# Patient Record
Sex: Female | Born: 1955 | Race: Black or African American | Hispanic: No | Marital: Single | Smoking: Never smoker
Health system: Southern US, Community
[De-identification: ages and names within clinical notes are randomized; demographics above are authoritative.]

## PROBLEM LIST (undated history)

## (undated) DIAGNOSIS — Z862 Personal history of diseases of the blood and blood-forming organs and certain disorders involving the immune mechanism: Secondary | ICD-10-CM

## (undated) DIAGNOSIS — K635 Polyp of colon: Secondary | ICD-10-CM

## (undated) DIAGNOSIS — N8501 Benign endometrial hyperplasia: Secondary | ICD-10-CM

## (undated) DIAGNOSIS — Z973 Presence of spectacles and contact lenses: Secondary | ICD-10-CM

## (undated) DIAGNOSIS — M5430 Sciatica, unspecified side: Secondary | ICD-10-CM

## (undated) DIAGNOSIS — N3946 Mixed incontinence: Secondary | ICD-10-CM

## (undated) DIAGNOSIS — I00 Rheumatic fever without heart involvement: Secondary | ICD-10-CM

## (undated) DIAGNOSIS — K219 Gastro-esophageal reflux disease without esophagitis: Secondary | ICD-10-CM

## (undated) DIAGNOSIS — I10 Essential (primary) hypertension: Secondary | ICD-10-CM

## (undated) DIAGNOSIS — L900 Lichen sclerosus et atrophicus: Secondary | ICD-10-CM

## (undated) DIAGNOSIS — R112 Nausea with vomiting, unspecified: Secondary | ICD-10-CM

## (undated) DIAGNOSIS — D219 Benign neoplasm of connective and other soft tissue, unspecified: Secondary | ICD-10-CM

## (undated) DIAGNOSIS — D259 Leiomyoma of uterus, unspecified: Secondary | ICD-10-CM

## (undated) DIAGNOSIS — IMO0002 Reserved for concepts with insufficient information to code with codable children: Secondary | ICD-10-CM

## (undated) DIAGNOSIS — Z8679 Personal history of other diseases of the circulatory system: Secondary | ICD-10-CM

## (undated) DIAGNOSIS — D649 Anemia, unspecified: Secondary | ICD-10-CM

## (undated) DIAGNOSIS — Z8709 Personal history of other diseases of the respiratory system: Secondary | ICD-10-CM

## (undated) DIAGNOSIS — J45909 Unspecified asthma, uncomplicated: Secondary | ICD-10-CM

## (undated) DIAGNOSIS — F32A Depression, unspecified: Secondary | ICD-10-CM

## (undated) DIAGNOSIS — F329 Major depressive disorder, single episode, unspecified: Secondary | ICD-10-CM

## (undated) DIAGNOSIS — M1990 Unspecified osteoarthritis, unspecified site: Secondary | ICD-10-CM

## (undated) DIAGNOSIS — G4733 Obstructive sleep apnea (adult) (pediatric): Secondary | ICD-10-CM

## (undated) DIAGNOSIS — R600 Localized edema: Secondary | ICD-10-CM

## (undated) DIAGNOSIS — N3281 Overactive bladder: Secondary | ICD-10-CM

## (undated) DIAGNOSIS — I5189 Other ill-defined heart diseases: Secondary | ICD-10-CM

## (undated) DIAGNOSIS — J382 Nodules of vocal cords: Secondary | ICD-10-CM

## (undated) DIAGNOSIS — N840 Polyp of corpus uteri: Secondary | ICD-10-CM

## (undated) DIAGNOSIS — Z9889 Other specified postprocedural states: Secondary | ICD-10-CM

## (undated) DIAGNOSIS — F411 Generalized anxiety disorder: Secondary | ICD-10-CM

## (undated) DIAGNOSIS — R7303 Prediabetes: Secondary | ICD-10-CM

## (undated) DIAGNOSIS — I4510 Unspecified right bundle-branch block: Secondary | ICD-10-CM

## (undated) HISTORY — PX: HYSTEROSCOPY: SHX211

## (undated) HISTORY — DX: Major depressive disorder, single episode, unspecified: F32.9

## (undated) HISTORY — PX: DILATION AND CURETTAGE OF UTERUS: SHX78

## (undated) HISTORY — DX: Benign neoplasm of connective and other soft tissue, unspecified: D21.9

## (undated) HISTORY — DX: Essential (primary) hypertension: I10

## (undated) HISTORY — DX: Reserved for concepts with insufficient information to code with codable children: IMO0002

## (undated) HISTORY — PX: TONSILLECTOMY: SUR1361

## (undated) HISTORY — DX: Benign endometrial hyperplasia: N85.01

## (undated) HISTORY — DX: Polyp of colon: K63.5

## (undated) HISTORY — DX: Unspecified osteoarthritis, unspecified site: M19.90

## (undated) HISTORY — PX: COLONOSCOPY: SHX174

## (undated) HISTORY — DX: Depression, unspecified: F32.A

---

## 1997-11-17 HISTORY — PX: MYOMECTOMY ABDOMINAL APPROACH: SUR870

## 1997-11-17 HISTORY — PX: MYOMECTOMY: SHX85

## 2003-11-25 ENCOUNTER — Emergency Department (HOSPITAL_COMMUNITY): Admission: EM | Admit: 2003-11-25 | Discharge: 2003-11-25 | Payer: Self-pay | Admitting: Emergency Medicine

## 2004-05-21 ENCOUNTER — Encounter: Admission: RE | Admit: 2004-05-21 | Discharge: 2004-08-19 | Payer: Self-pay | Admitting: Internal Medicine

## 2004-06-20 ENCOUNTER — Ambulatory Visit (HOSPITAL_COMMUNITY): Admission: RE | Admit: 2004-06-20 | Discharge: 2004-06-20 | Payer: Self-pay | Admitting: Obstetrics

## 2004-11-05 ENCOUNTER — Encounter: Admission: RE | Admit: 2004-11-05 | Discharge: 2004-11-05 | Payer: Self-pay | Admitting: Internal Medicine

## 2005-11-12 ENCOUNTER — Emergency Department (HOSPITAL_COMMUNITY): Admission: EM | Admit: 2005-11-12 | Discharge: 2005-11-12 | Payer: Self-pay | Admitting: Family Medicine

## 2006-05-22 ENCOUNTER — Ambulatory Visit (HOSPITAL_COMMUNITY): Admission: RE | Admit: 2006-05-22 | Discharge: 2006-05-22 | Payer: Self-pay | Admitting: Obstetrics

## 2006-12-10 ENCOUNTER — Emergency Department (HOSPITAL_COMMUNITY): Admission: EM | Admit: 2006-12-10 | Discharge: 2006-12-10 | Payer: Self-pay | Admitting: Family Medicine

## 2006-12-14 ENCOUNTER — Emergency Department (HOSPITAL_COMMUNITY): Admission: EM | Admit: 2006-12-14 | Discharge: 2006-12-14 | Payer: Self-pay | Admitting: Family Medicine

## 2008-08-22 ENCOUNTER — Ambulatory Visit (HOSPITAL_COMMUNITY): Admission: RE | Admit: 2008-08-22 | Discharge: 2008-08-22 | Payer: Self-pay | Admitting: Internal Medicine

## 2008-10-05 ENCOUNTER — Ambulatory Visit: Payer: Self-pay | Admitting: Obstetrics and Gynecology

## 2008-10-05 ENCOUNTER — Encounter: Payer: Self-pay | Admitting: Obstetrics and Gynecology

## 2008-10-05 ENCOUNTER — Other Ambulatory Visit: Admission: RE | Admit: 2008-10-05 | Discharge: 2008-10-05 | Payer: Self-pay | Admitting: Obstetrics and Gynecology

## 2008-10-17 ENCOUNTER — Ambulatory Visit: Payer: Self-pay | Admitting: Obstetrics and Gynecology

## 2008-11-05 ENCOUNTER — Emergency Department (HOSPITAL_COMMUNITY): Admission: EM | Admit: 2008-11-05 | Discharge: 2008-11-05 | Payer: Self-pay | Admitting: Family Medicine

## 2008-11-17 DIAGNOSIS — Z8742 Personal history of other diseases of the female genital tract: Secondary | ICD-10-CM

## 2008-11-17 HISTORY — DX: Personal history of other diseases of the female genital tract: Z87.42

## 2008-11-24 ENCOUNTER — Ambulatory Visit: Payer: Self-pay | Admitting: Obstetrics and Gynecology

## 2008-11-24 ENCOUNTER — Encounter: Payer: Self-pay | Admitting: Obstetrics and Gynecology

## 2008-11-24 ENCOUNTER — Ambulatory Visit (HOSPITAL_COMMUNITY): Admission: RE | Admit: 2008-11-24 | Discharge: 2008-11-24 | Payer: Self-pay | Admitting: Obstetrics and Gynecology

## 2008-12-04 HISTORY — PX: HYSTEROSCOPY WITH D & C: SHX1775

## 2008-12-25 ENCOUNTER — Ambulatory Visit: Payer: Self-pay | Admitting: Obstetrics and Gynecology

## 2009-02-26 ENCOUNTER — Ambulatory Visit: Payer: Self-pay | Admitting: Obstetrics and Gynecology

## 2009-05-03 ENCOUNTER — Ambulatory Visit: Payer: Self-pay | Admitting: Obstetrics and Gynecology

## 2009-10-23 ENCOUNTER — Ambulatory Visit (HOSPITAL_COMMUNITY): Admission: RE | Admit: 2009-10-23 | Discharge: 2009-10-23 | Payer: Self-pay | Admitting: Internal Medicine

## 2009-12-28 ENCOUNTER — Other Ambulatory Visit: Admission: RE | Admit: 2009-12-28 | Discharge: 2009-12-28 | Payer: Self-pay | Admitting: Obstetrics and Gynecology

## 2009-12-28 ENCOUNTER — Ambulatory Visit: Payer: Self-pay | Admitting: Obstetrics and Gynecology

## 2010-01-01 ENCOUNTER — Ambulatory Visit: Payer: Self-pay | Admitting: Obstetrics and Gynecology

## 2010-04-05 ENCOUNTER — Ambulatory Visit (HOSPITAL_BASED_OUTPATIENT_CLINIC_OR_DEPARTMENT_OTHER): Admission: RE | Admit: 2010-04-05 | Discharge: 2010-04-05 | Payer: Self-pay | Admitting: Internal Medicine

## 2010-04-06 ENCOUNTER — Ambulatory Visit: Payer: Self-pay | Admitting: Internal Medicine

## 2010-12-08 ENCOUNTER — Encounter: Payer: Self-pay | Admitting: Internal Medicine

## 2011-01-31 ENCOUNTER — Other Ambulatory Visit (HOSPITAL_COMMUNITY): Payer: Self-pay | Admitting: Internal Medicine

## 2011-02-07 ENCOUNTER — Other Ambulatory Visit (HOSPITAL_COMMUNITY): Payer: Self-pay | Admitting: Internal Medicine

## 2011-03-03 LAB — BASIC METABOLIC PANEL
BUN: 10 mg/dL (ref 6–23)
BUN: 11 mg/dL (ref 6–23)
CO2: 25 mEq/L (ref 19–32)
CO2: 28 mEq/L (ref 19–32)
Calcium: 8.3 mg/dL — ABNORMAL LOW (ref 8.4–10.5)
Calcium: 9 mg/dL (ref 8.4–10.5)
Chloride: 101 mEq/L (ref 96–112)
Chloride: 103 mEq/L (ref 96–112)
Creatinine, Ser: 0.63 mg/dL (ref 0.4–1.2)
Creatinine, Ser: 0.78 mg/dL (ref 0.4–1.2)
GFR calc Af Amer: >60 mL/min (ref >60)
GFR calc Af Amer: >60 mL/min (ref >60)
GFR calc non Af Amer: >60 mL/min (ref >60)
GFR calc non Af Amer: >60 mL/min (ref >60)
Glucose, Bld: 108 mg/dL — ABNORMAL HIGH (ref 70–99)
Glucose, Bld: 118 mg/dL — ABNORMAL HIGH (ref 70–99)
Potassium: 3.5 mEq/L (ref 3.5–5.1)
Potassium: 4.2 mEq/L (ref 3.5–5.1)
Sodium: 133 mEq/L — ABNORMAL LOW (ref 135–145)
Sodium: 137 mEq/L (ref 135–145)

## 2011-03-03 LAB — URINALYSIS, ROUTINE W REFLEX MICROSCOPIC
Glucose, UA: NEGATIVE mg/dL
Hgb urine dipstick: NEGATIVE
Ketones, ur: NEGATIVE mg/dL
Nitrite: NEGATIVE
Protein, ur: NEGATIVE mg/dL
Specific Gravity, Urine: 1.025 (ref 1.005–1.030)
Urobilinogen, UA: 0.2 mg/dL (ref 0.0–1.0)
pH: 5.5 (ref 5.0–8.0)

## 2011-03-03 LAB — CBC
HCT: 42.2 % (ref 36.0–46.0)
Hemoglobin: 13.8 g/dL (ref 12.0–15.0)
MCHC: 32.8 g/dL (ref 30.0–36.0)
MCV: 81 fL (ref 78.0–100.0)
Platelets: 345 10*3/uL (ref 150–400)
RBC: 5.2 MIL/uL — ABNORMAL HIGH (ref 3.87–5.11)
RDW: 16.1 % — ABNORMAL HIGH (ref 11.5–15.5)
WBC: 9.1 10*3/uL (ref 4.0–10.5)

## 2011-03-03 LAB — HCG, SERUM, QUALITATIVE: Preg, Serum: NEGATIVE

## 2011-03-27 ENCOUNTER — Other Ambulatory Visit (HOSPITAL_COMMUNITY): Payer: Self-pay | Admitting: Internal Medicine

## 2011-03-27 DIAGNOSIS — Z1231 Encounter for screening mammogram for malignant neoplasm of breast: Secondary | ICD-10-CM

## 2011-04-01 NOTE — Op Note (Signed)
NAMECOLENE, MINES                ACCOUNT NO.:  [id number]   MEDICAL RECORD NO.:  [id number]          PATIENT TYPE:  AMB   LOCATION:  SDC                           FACILITY:  [facility]   PHYSICIAN:  Daniel L. Gottsegen, M.D.DATE OF BIRTH:  Apr 22, 1956   DATE OF PROCEDURE:  11/24/2008  DATE OF DISCHARGE:                               OPERATIVE REPORT   PREOPERATIVE DIAGNOSES:  Postmenopausal bleeding, history of endometrial  hyperplasia, enlarged endometrial cavity, myomas.   POSTOPERATIVE DIAGNOSES:  Postmenopausal bleeding, history of  endometrial hyperplasia, enlarged endometrial cavity, myomas,  intracavitary myoma.   SURGEON:  Daniel L. Eda Paschal, MD   ANESTHESIA:  General.   INDICATIONS:  The patient is a 55 year old gravida 2, para 0-0-2-0, who  had come to see me last month with a history postmenopausal bleeding.  She does have a history of endometrial hyperplasia and she also is  hypertensive.  We went ahead  and scheduled an ultrasound on her.  At  the time of ultrasound, she had multiple myomas, her endometrial cavity  was extremely enlarged at 21 mm, there was a solid focus in  the  endometrial cavity which was was positive for power flow doppler.  Because of the history postmenopausal bleeding, endometrial hyperplasia,  and an enlarged cavity, she now enters to the hospital for hysteroscopy  with excision of the above mass.   FINDINGS:  External exam is normal.  BUS is normal.  Vaginal is normal.  Cervix is clean.  Uterus is enlarged by myomas with 6-7 weeks' size.  There is no descensus.  Adnexa failed to reveal masses.  At the time of  hysteroscopy, there was a 2-3 cm myoma present.  It appeared to be  attached posteriorly to the top of the fundus.  Other than this, there  were no other findings that were abnormal.   PROCEDURE:  After adequate general anesthesia, the patient was placed in  dorsal lithotomy position, prepped and draped in the usual sterile  manner.  A  single-tooth tenaculum was placed in the anterior lip of the  cervix.  The cervix was dilated to #27 [facility] dilator and a hysteroscopic  examination was done with a hysteroscopic resectoscope.  Findings were  as noted above.  A double wire loop at 90-degree was used with  appropriate Bovie settings, 3% sorbitol was used to expand the  intrauterine cavity and a camera was used for magnification.  An attempt  was made to resect the myoma, small pieces could be removed, but with a  double loop and the large size of the myoma, it was sometimes difficult  to be sure we knew where we were, and to avoid perforation of the  uterus, it was felt that we could not continue the procedure.  The myoma  also was extremely firm and even with the double loop it would bounce  off the myoma from time to time.  Therefore, we changed to the larger  resectoscope with a single 90-degree wire loop.  We had to dilate the  cervix first to a #31 Shawnie Pons to get it in, and  then we could resect the  myoma much easier.  The myoma was resected in multiple pieces.  At one  point the procedure, the nurse thought that the fluid deficit had gone  to 900 mL.  Anesthesia came in to draw electrolytes, however, it was  obvious that there had been a leak higher up in the equipment and then  most of the fluid was on the floor not in the patient.  Electrolytes  were obtained anyway just to be sure, they came back normal, and then we  finished the procedure.  At the termination of  the procedure, there was no significant bleeding.  Blood loss had been  less than 100 mL.  Our best guesstimate was that fluid deficit was  between 200 mL and 300 mL.  She clearly had never had a perforation.  She left the operating satisfactory in condition.      Daniel L. Eda Paschal, M.D.  Electronically Signed     DLG/MEDQ  D:  11/24/2008  T:  11/25/2008  Job:  [id number]

## 2011-04-04 ENCOUNTER — Ambulatory Visit (HOSPITAL_COMMUNITY)
Admission: RE | Admit: 2011-04-04 | Discharge: 2011-04-04 | Disposition: A | Discharge status: Home / Self Care | Payer: Medicare Other | Source: Ambulatory Visit | Attending: Internal Medicine | Admitting: Internal Medicine

## 2011-04-04 DIAGNOSIS — Z1231 Encounter for screening mammogram for malignant neoplasm of breast: Secondary | ICD-10-CM

## 2011-05-07 ENCOUNTER — Encounter (INDEPENDENT_AMBULATORY_CARE_PROVIDER_SITE_OTHER): Payer: Medicare Other | Admitting: Obstetrics and Gynecology

## 2011-05-07 DIAGNOSIS — R82998 Other abnormal findings in urine: Secondary | ICD-10-CM

## 2011-05-07 DIAGNOSIS — N644 Mastodynia: Secondary | ICD-10-CM

## 2011-05-07 DIAGNOSIS — B373 Candidiasis of vulva and vagina: Secondary | ICD-10-CM

## 2011-05-07 DIAGNOSIS — D259 Leiomyoma of uterus, unspecified: Secondary | ICD-10-CM

## 2011-05-07 DIAGNOSIS — N8501 Benign endometrial hyperplasia: Secondary | ICD-10-CM

## 2011-07-25 ENCOUNTER — Emergency Department (HOSPITAL_COMMUNITY)
Admission: EM | Admit: 2011-07-25 | Discharge: 2011-07-26 | Disposition: A | Discharge status: Home / Self Care | Payer: Medicare Other | Source: Home / Self Care | Attending: Emergency Medicine | Admitting: Emergency Medicine

## 2011-07-25 DIAGNOSIS — R609 Edema, unspecified: Secondary | ICD-10-CM | POA: Insufficient documentation

## 2011-07-25 DIAGNOSIS — E876 Hypokalemia: Secondary | ICD-10-CM | POA: Insufficient documentation

## 2011-07-25 DIAGNOSIS — I10 Essential (primary) hypertension: Secondary | ICD-10-CM | POA: Insufficient documentation

## 2011-07-25 DIAGNOSIS — M7989 Other specified soft tissue disorders: Secondary | ICD-10-CM | POA: Insufficient documentation

## 2011-07-26 ENCOUNTER — Emergency Department (HOSPITAL_COMMUNITY): Payer: Medicare Other

## 2011-07-26 ENCOUNTER — Ambulatory Visit (HOSPITAL_COMMUNITY)
Admission: EM | Admit: 2011-07-26 | Discharge: 2011-07-26 | Disposition: A | Discharge status: Home / Self Care | Payer: Medicare Other | Source: Ambulatory Visit | Attending: Emergency Medicine | Admitting: Emergency Medicine

## 2011-07-26 DIAGNOSIS — M79609 Pain in unspecified limb: Secondary | ICD-10-CM | POA: Insufficient documentation

## 2011-07-26 LAB — POCT I-STAT, CHEM 8
BUN: 24 mg/dL — ABNORMAL HIGH (ref 6–23)
Calcium, Ion: 1.13 mmol/L (ref 1.12–1.32)
Chloride: 104 mEq/L (ref 96–112)
Creatinine, Ser: 1 mg/dL (ref 0.50–1.10)
Glucose, Bld: 153 mg/dL — ABNORMAL HIGH (ref 70–99)
HCT: 43 % (ref 36.0–46.0)
Hemoglobin: 14.6 g/dL (ref 12.0–15.0)
Potassium: 3.2 mEq/L — ABNORMAL LOW (ref 3.5–5.1)
Sodium: 140 mEq/L (ref 135–145)
TCO2: 25 mmol/L (ref 0–100)

## 2012-03-29 ENCOUNTER — Other Ambulatory Visit: Payer: Self-pay | Admitting: Obstetrics and Gynecology

## 2012-03-29 DIAGNOSIS — Z13820 Encounter for screening for osteoporosis: Secondary | ICD-10-CM

## 2012-03-30 ENCOUNTER — Ambulatory Visit (INDEPENDENT_AMBULATORY_CARE_PROVIDER_SITE_OTHER): Payer: Medicare Other

## 2012-03-30 DIAGNOSIS — Z13820 Encounter for screening for osteoporosis: Secondary | ICD-10-CM

## 2012-04-06 ENCOUNTER — Encounter: Payer: Self-pay | Admitting: Obstetrics and Gynecology

## 2012-04-09 ENCOUNTER — Other Ambulatory Visit (HOSPITAL_COMMUNITY): Payer: Self-pay | Admitting: Internal Medicine

## 2012-04-09 DIAGNOSIS — Z1231 Encounter for screening mammogram for malignant neoplasm of breast: Secondary | ICD-10-CM

## 2012-04-30 ENCOUNTER — Encounter: Payer: Self-pay | Admitting: Gynecology

## 2012-04-30 DIAGNOSIS — D219 Benign neoplasm of connective and other soft tissue, unspecified: Secondary | ICD-10-CM | POA: Insufficient documentation

## 2012-04-30 DIAGNOSIS — M1990 Unspecified osteoarthritis, unspecified site: Secondary | ICD-10-CM | POA: Insufficient documentation

## 2012-04-30 DIAGNOSIS — F32A Depression, unspecified: Secondary | ICD-10-CM | POA: Insufficient documentation

## 2012-04-30 DIAGNOSIS — F329 Major depressive disorder, single episode, unspecified: Secondary | ICD-10-CM | POA: Insufficient documentation

## 2012-04-30 DIAGNOSIS — I10 Essential (primary) hypertension: Secondary | ICD-10-CM | POA: Insufficient documentation

## 2012-05-11 ENCOUNTER — Encounter: Payer: Medicare Other | Admitting: Obstetrics and Gynecology

## 2014-09-18 ENCOUNTER — Encounter: Payer: Self-pay | Admitting: Gynecology

## 2015-01-17 DIAGNOSIS — F322 Major depressive disorder, single episode, severe without psychotic features: Secondary | ICD-10-CM | POA: Diagnosis not present

## 2015-01-17 DIAGNOSIS — J302 Other seasonal allergic rhinitis: Secondary | ICD-10-CM | POA: Diagnosis not present

## 2015-01-17 DIAGNOSIS — K219 Gastro-esophageal reflux disease without esophagitis: Secondary | ICD-10-CM | POA: Diagnosis not present

## 2015-01-17 DIAGNOSIS — I10 Essential (primary) hypertension: Secondary | ICD-10-CM | POA: Diagnosis not present

## 2015-01-17 DIAGNOSIS — J4520 Mild intermittent asthma, uncomplicated: Secondary | ICD-10-CM | POA: Diagnosis not present

## 2015-06-05 DIAGNOSIS — M5441 Lumbago with sciatica, right side: Secondary | ICD-10-CM | POA: Diagnosis not present

## 2015-06-05 DIAGNOSIS — F322 Major depressive disorder, single episode, severe without psychotic features: Secondary | ICD-10-CM | POA: Diagnosis not present

## 2015-06-05 DIAGNOSIS — J4520 Mild intermittent asthma, uncomplicated: Secondary | ICD-10-CM | POA: Diagnosis not present

## 2015-06-05 DIAGNOSIS — N390 Urinary tract infection, site not specified: Secondary | ICD-10-CM | POA: Diagnosis not present

## 2015-06-05 DIAGNOSIS — I10 Essential (primary) hypertension: Secondary | ICD-10-CM | POA: Diagnosis not present

## 2015-06-11 ENCOUNTER — Other Ambulatory Visit (HOSPITAL_COMMUNITY): Payer: Self-pay | Admitting: Internal Medicine

## 2015-06-11 DIAGNOSIS — Z1231 Encounter for screening mammogram for malignant neoplasm of breast: Secondary | ICD-10-CM

## 2015-06-13 DIAGNOSIS — M9904 Segmental and somatic dysfunction of sacral region: Secondary | ICD-10-CM | POA: Diagnosis not present

## 2015-06-13 DIAGNOSIS — M9903 Segmental and somatic dysfunction of lumbar region: Secondary | ICD-10-CM | POA: Diagnosis not present

## 2015-06-13 DIAGNOSIS — M5416 Radiculopathy, lumbar region: Secondary | ICD-10-CM | POA: Diagnosis not present

## 2015-06-13 DIAGNOSIS — M533 Sacrococcygeal disorders, not elsewhere classified: Secondary | ICD-10-CM | POA: Diagnosis not present

## 2015-06-13 DIAGNOSIS — M9901 Segmental and somatic dysfunction of cervical region: Secondary | ICD-10-CM | POA: Diagnosis not present

## 2015-06-18 ENCOUNTER — Ambulatory Visit (HOSPITAL_COMMUNITY): Payer: Self-pay

## 2015-06-18 DIAGNOSIS — M5416 Radiculopathy, lumbar region: Secondary | ICD-10-CM | POA: Diagnosis not present

## 2015-06-18 DIAGNOSIS — M179 Osteoarthritis of knee, unspecified: Secondary | ICD-10-CM | POA: Diagnosis not present

## 2015-06-18 DIAGNOSIS — R600 Localized edema: Secondary | ICD-10-CM | POA: Diagnosis not present

## 2015-06-18 DIAGNOSIS — M9901 Segmental and somatic dysfunction of cervical region: Secondary | ICD-10-CM | POA: Diagnosis not present

## 2015-06-18 DIAGNOSIS — M9903 Segmental and somatic dysfunction of lumbar region: Secondary | ICD-10-CM | POA: Diagnosis not present

## 2015-06-18 DIAGNOSIS — J029 Acute pharyngitis, unspecified: Secondary | ICD-10-CM | POA: Diagnosis not present

## 2015-06-18 DIAGNOSIS — J302 Other seasonal allergic rhinitis: Secondary | ICD-10-CM | POA: Diagnosis not present

## 2015-06-18 DIAGNOSIS — M9904 Segmental and somatic dysfunction of sacral region: Secondary | ICD-10-CM | POA: Diagnosis not present

## 2015-06-18 DIAGNOSIS — M533 Sacrococcygeal disorders, not elsewhere classified: Secondary | ICD-10-CM | POA: Diagnosis not present

## 2015-06-18 DIAGNOSIS — M5441 Lumbago with sciatica, right side: Secondary | ICD-10-CM | POA: Diagnosis not present

## 2015-06-19 ENCOUNTER — Ambulatory Visit (HOSPITAL_COMMUNITY)
Admission: RE | Admit: 2015-06-19 | Discharge: 2015-06-19 | Disposition: A | Discharge status: Home / Self Care | Payer: Medicare Other | Source: Ambulatory Visit | Attending: Internal Medicine | Admitting: Internal Medicine

## 2015-06-19 DIAGNOSIS — Z1231 Encounter for screening mammogram for malignant neoplasm of breast: Secondary | ICD-10-CM | POA: Insufficient documentation

## 2015-06-20 DIAGNOSIS — M9904 Segmental and somatic dysfunction of sacral region: Secondary | ICD-10-CM | POA: Diagnosis not present

## 2015-06-20 DIAGNOSIS — M533 Sacrococcygeal disorders, not elsewhere classified: Secondary | ICD-10-CM | POA: Diagnosis not present

## 2015-06-20 DIAGNOSIS — M9901 Segmental and somatic dysfunction of cervical region: Secondary | ICD-10-CM | POA: Diagnosis not present

## 2015-06-20 DIAGNOSIS — M5416 Radiculopathy, lumbar region: Secondary | ICD-10-CM | POA: Diagnosis not present

## 2015-06-20 DIAGNOSIS — M9903 Segmental and somatic dysfunction of lumbar region: Secondary | ICD-10-CM | POA: Diagnosis not present

## 2015-06-21 ENCOUNTER — Emergency Department (HOSPITAL_COMMUNITY)
Admission: EM | Admit: 2015-06-21 | Discharge: 2015-06-21 | Disposition: A | Discharge status: Home / Self Care | Payer: Medicare Other | Attending: Emergency Medicine | Admitting: Emergency Medicine

## 2015-06-21 ENCOUNTER — Encounter (HOSPITAL_COMMUNITY): Payer: Self-pay

## 2015-06-21 DIAGNOSIS — F329 Major depressive disorder, single episode, unspecified: Secondary | ICD-10-CM | POA: Insufficient documentation

## 2015-06-21 DIAGNOSIS — K5903 Drug induced constipation: Secondary | ICD-10-CM

## 2015-06-21 DIAGNOSIS — M1990 Unspecified osteoarthritis, unspecified site: Secondary | ICD-10-CM | POA: Diagnosis not present

## 2015-06-21 DIAGNOSIS — T402X5A Adverse effect of other opioids, initial encounter: Secondary | ICD-10-CM | POA: Insufficient documentation

## 2015-06-21 DIAGNOSIS — K5900 Constipation, unspecified: Secondary | ICD-10-CM | POA: Diagnosis not present

## 2015-06-21 DIAGNOSIS — H6982 Other specified disorders of Eustachian tube, left ear: Secondary | ICD-10-CM | POA: Diagnosis not present

## 2015-06-21 DIAGNOSIS — N390 Urinary tract infection, site not specified: Secondary | ICD-10-CM | POA: Diagnosis not present

## 2015-06-21 DIAGNOSIS — Z8540 Personal history of malignant neoplasm of unspecified female genital organ: Secondary | ICD-10-CM | POA: Insufficient documentation

## 2015-06-21 DIAGNOSIS — I10 Essential (primary) hypertension: Secondary | ICD-10-CM | POA: Diagnosis not present

## 2015-06-21 DIAGNOSIS — K140 Glossitis: Secondary | ICD-10-CM | POA: Diagnosis not present

## 2015-06-21 DIAGNOSIS — J310 Chronic rhinitis: Secondary | ICD-10-CM | POA: Diagnosis not present

## 2015-06-21 NOTE — ED Notes (Signed)
Awake. Verbally responsive. Resp even and unlabored. No audible adventitious breath sounds noted. ABC's intact. Abd soft/nondistended but tender to palpate. BS (+) and active x4 quadrants. No N/V/D reported. Pt reported having symptoms of constipation

## 2015-06-21 NOTE — ED Notes (Signed)
Awake. Verbally responsive. A/O x4. Resp even and unlabored. No audible adventitious breath sounds noted. ABC's intact.  

## 2015-06-21 NOTE — ED Notes (Signed)
Pt c/o constipation x 2 weeks and rectal pain x 2-3 days.  Pain score 10/10.  Pt reports being seen by PCP x 3 days ago and started on Linzess.  Sts chiropractor gave her another medication yesterday which she took w/o relief.

## 2015-06-21 NOTE — Discharge Instructions (Signed)
Take 8 scoops in 32oz of liquid of your choice of miralax Constipation Constipation is when a person has fewer than three bowel movements a week, has difficulty having a bowel movement, or has stools that are dry, hard, or larger than normal. As people grow older, constipation is more common. If you try to fix constipation with medicines that make you have a bowel movement (laxatives), the problem may get worse. Long-term laxative use may cause the muscles of the colon to become weak. A low-fiber diet, not taking in enough fluids, and taking certain medicines may make constipation worse.  CAUSES   Certain medicines, such as antidepressants, pain medicine, iron supplements, antacids, and water pills.   Certain diseases, such as diabetes, irritable bowel syndrome (IBS), thyroid disease, or depression.   Not drinking enough water.   Not eating enough fiber-rich foods.   Stress or travel.   Lack of physical activity or exercise.   Ignoring the urge to have a bowel movement.   Using laxatives too much.  SIGNS AND SYMPTOMS   Having fewer than three bowel movements a week.   Straining to have a bowel movement.   Having stools that are hard, dry, or larger than normal.   Feeling full or bloated.   Pain in the lower abdomen.   Not feeling relief after having a bowel movement.  DIAGNOSIS  Your health care provider will take a medical history and perform a physical exam. Further testing may be done for severe constipation. Some tests may include:  A barium enema X-ray to examine your rectum, colon, and, sometimes, your small intestine.   A sigmoidoscopy to examine your lower colon.   A colonoscopy to examine your entire colon. TREATMENT  Treatment will depend on the severity of your constipation and what is causing it. Some dietary treatments include drinking more fluids and eating more fiber-rich foods. Lifestyle treatments may include regular exercise. If these diet  and lifestyle recommendations do not help, your health care provider may recommend taking over-the-counter laxative medicines to help you have bowel movements. Prescription medicines may be prescribed if over-the-counter medicines do not work.  HOME CARE INSTRUCTIONS   Eat foods that have a lot of fiber, such as fruits, vegetables, whole grains, and beans.  Limit foods high in fat and processed sugars, such as french fries, hamburgers, cookies, candies, and soda.   A fiber supplement may be added to your diet if you cannot get enough fiber from foods.   Drink enough fluids to keep your urine clear or pale yellow.   Exercise regularly or as directed by your health care provider.   Go to the restroom when you have the urge to go. Do not hold it.   Only take over-the-counter or prescription medicines as directed by your health care provider. Do not take other medicines for constipation without talking to your health care provider first.  [place] IF:   You have bright red blood in your stool.   Your constipation lasts for more than 4 days or gets worse.   You have abdominal or rectal pain.   You have thin, pencil-like stools.   You have unexplained weight loss. MAKE SURE YOU:   Understand these instructions.  Will watch your condition.  Will get help right away if you are not doing well or get worse. Document Released: 08/01/2004 Document Revised: 11/08/2013 Document Reviewed: 08/15/2013 [facility] Patient Information 2015 [place], [place]. This information is not intended to replace advice given to  you by your health care provider. Make sure you discuss any questions you have with your health care provider.

## 2015-06-21 NOTE — ED Notes (Signed)
MD at bedside to do manual rectal exam. Removed small amt of stool. Pt tolerated well.

## 2015-06-21 NOTE — ED Provider Notes (Signed)
CSN: [id number]     Arrival date & time 06/21/15  1340 History   First MD Initiated Contact with Patient 06/21/15 1408     Chief Complaint  Patient presents with  . Constipation  . Rectal Pain     (Consider location/radiation/quality/duration/timing/severity/associated sxs/prior Treatment) Patient is a 59 y.o. female presenting with constipation. The history is provided by the patient.  Constipation Severity:  Severe Time since last bowel movement:  2 weeks Timing:  Constant Progression:  Worsening Chronicity:  Recurrent Context: narcotics   Stool description:  None produced Unusual stool frequency:  Unsure Relieved by:  Nothing Worsened by:  Nothing tried Ineffective treatments:  Enemas Associated symptoms: no dysuria, no fever, no nausea and no vomiting   Risk factors: obesity   Risk factors: no change in medication and no hx of abdominal surgery    59 yo F with a chief complaint of constipation. Patient states is been going on for at least 2 weeks though she is not sure of her last bowel movement. Patient was seen by her PCP 2 weeks ago and then a week ago started on Linzess. Patient with some mild abdominal pain denies vomiting or nausea. Patient has a history of chronic back pain is seen by pain management for this. Has had chronic constipation issues since.  Past Medical History  Diagnosis Date  . Herniated disc   . Arthritis     Osteoarthritis  . Hypertension   . Depression   . Fibroid    Past Surgical History  Procedure Laterality Date  . Myomectomy    . Tonsillectomy and adenoidectomy    . Dilation and curettage of uterus    . Hysteroscopy     Family History  Problem Relation Age of Onset  . Diabetes Mother   . Hypertension Mother   . Diabetes Father   . Heart disease Sister   . Hypertension Maternal Grandmother   . Breast cancer Sister     Half sister   History  Substance Use Topics  . Smoking status: Never Smoker   . Smokeless tobacco: Not on file   . Alcohol Use: Yes     Comment: occ   OB History    Gravida Para Term Preterm AB TAB SAB Ectopic Multiple Living   2    2     0     Review of Systems  Constitutional: Negative for fever and chills.  HENT: Negative for congestion and rhinorrhea.   Eyes: Negative for redness and visual disturbance.  Respiratory: Negative for shortness of breath and wheezing.   Cardiovascular: Negative for chest pain and palpitations.  Gastrointestinal: Positive for constipation and rectal pain. Negative for nausea and vomiting.  Genitourinary: Negative for dysuria and urgency.  Musculoskeletal: Negative for myalgias and arthralgias.  Skin: Negative for pallor and wound.  Neurological: Negative for dizziness and headaches.      Allergies  Review of patient's allergies indicates no known allergies.  Home Medications   Prior to Admission medications   Medication Sig Start Date End Date Taking? Authorizing Provider  olmesartan-hydrochlorothiazide (BENICAR HCT) 40-25 MG per tablet Take 1 tablet by mouth daily.    Historical Provider, MD   BP 135/85 mmHg  Pulse 99  Temp(Src) 98.5 F (36.9 C) (Oral)  Resp 20  SpO2 100% Physical Exam  Constitutional: She is oriented to person, place, and time. She appears well-developed and well-nourished. No distress.  HENT:  Head: Normocephalic and atraumatic.  Eyes: EOM are normal. Pupils are  equal, round, and reactive to light.  Neck: Normal range of motion. Neck supple.  Cardiovascular: Normal rate and regular rhythm.  Exam reveals no gallop and no friction rub.   No murmur heard. Pulmonary/Chest: Effort normal. She has no wheezes. She has no rales.  Abdominal: Soft. She exhibits no distension. There is no tenderness. There is no rebound and no guarding.  Genitourinary:  Claylike stool in the vault.  Musculoskeletal: She exhibits no edema or tenderness.  Neurological: She is alert and oriented to person, place, and time.  Skin: Skin is warm and dry.  She is not diaphoretic.  Psychiatric: She has a normal mood and affect. Her behavior is normal.    ED Course  Fecal disimpaction Date/Time: 06/21/2015 3:00 PM Performed by: Tyrone Nine Abbrielle Batts Authorized by: Deno Etienne Consent: Verbal consent obtained. Risks and benefits: risks, benefits and alternatives were discussed Consent given by: patient Patient identity confirmed: verbally with patient Time out: Immediately prior to procedure a "time out" was called to verify the correct patient, procedure, equipment, support staff and site/side marked as required. Local anesthesia used: no Patient sedated: no Patient tolerance: Patient tolerated the procedure well with no immediate complications   (including critical care time) Labs Review Labs Reviewed - No data to display  Imaging Review Mm Digital Screening Bilateral  06/20/2015   CLINICAL DATA:  Screening.  EXAM: DIGITAL SCREENING BILATERAL MAMMOGRAM WITH CAD  COMPARISON:  Previous exam(s).  ACR Breast Density Category a: The breast tissue is almost entirely fatty.  FINDINGS: There are no findings suspicious for malignancy. Images were processed with CAD.  IMPRESSION: No mammographic evidence of malignancy. A result letter of this screening mammogram will be mailed directly to the patient.  RECOMMENDATION: Screening mammogram in one year. (Code:SM-B-01Y)  BI-RADS CATEGORY  1: Negative.   Electronically Signed   By: Franki Cabot M.D.   On: 06/20/2015 12:59     EKG Interpretation None      MDM   Final diagnoses:  Constipation due to opioid therapy    59 year old female with a chief complaint of constipation. Mild stool in the vault was cleared out as far as was possible. Patient to go home use MiraLAX and laxatives for relief.   3:03 PM:  I have discussed the diagnosis/risks/treatment options with the patient and believe the pt to be eligible for discharge home to follow-up with PCP. We also discussed returning to the ED immediately if new or  worsening sx occur. We discussed the sx which are most concerning (e.g., vomiting, no flatus) that necessitate immediate return. Medications administered to the patient during their visit and any new prescriptions provided to the patient are listed below.  Medications given during this visit Medications - No data to display  New Prescriptions   No medications on file     The patient appears reasonably screen and/or stabilized for discharge and I doubt any other medical condition or other [facility] requiring further screening, evaluation, or treatment in the ED at this time prior to discharge.      Deno Etienne, DO 06/21/15 [phone]

## 2015-06-25 DIAGNOSIS — J4520 Mild intermittent asthma, uncomplicated: Secondary | ICD-10-CM | POA: Diagnosis not present

## 2015-06-25 DIAGNOSIS — J302 Other seasonal allergic rhinitis: Secondary | ICD-10-CM | POA: Diagnosis not present

## 2015-06-25 DIAGNOSIS — K5909 Other constipation: Secondary | ICD-10-CM | POA: Diagnosis not present

## 2015-06-25 DIAGNOSIS — M5441 Lumbago with sciatica, right side: Secondary | ICD-10-CM | POA: Diagnosis not present

## 2015-06-25 DIAGNOSIS — I10 Essential (primary) hypertension: Secondary | ICD-10-CM | POA: Diagnosis not present

## 2015-06-26 DIAGNOSIS — M9901 Segmental and somatic dysfunction of cervical region: Secondary | ICD-10-CM | POA: Diagnosis not present

## 2015-06-26 DIAGNOSIS — M533 Sacrococcygeal disorders, not elsewhere classified: Secondary | ICD-10-CM | POA: Diagnosis not present

## 2015-06-26 DIAGNOSIS — M9904 Segmental and somatic dysfunction of sacral region: Secondary | ICD-10-CM | POA: Diagnosis not present

## 2015-06-26 DIAGNOSIS — M5416 Radiculopathy, lumbar region: Secondary | ICD-10-CM | POA: Diagnosis not present

## 2015-06-26 DIAGNOSIS — M9903 Segmental and somatic dysfunction of lumbar region: Secondary | ICD-10-CM | POA: Diagnosis not present

## 2015-06-27 DIAGNOSIS — F329 Major depressive disorder, single episode, unspecified: Secondary | ICD-10-CM | POA: Diagnosis not present

## 2015-06-27 DIAGNOSIS — Z86010 Personal history of colonic polyps: Secondary | ICD-10-CM | POA: Diagnosis not present

## 2015-06-27 DIAGNOSIS — K5900 Constipation, unspecified: Secondary | ICD-10-CM | POA: Diagnosis not present

## 2015-06-27 DIAGNOSIS — R1310 Dysphagia, unspecified: Secondary | ICD-10-CM | POA: Diagnosis not present

## 2015-06-28 DIAGNOSIS — M5416 Radiculopathy, lumbar region: Secondary | ICD-10-CM | POA: Diagnosis not present

## 2015-06-28 DIAGNOSIS — M9903 Segmental and somatic dysfunction of lumbar region: Secondary | ICD-10-CM | POA: Diagnosis not present

## 2015-06-28 DIAGNOSIS — M9904 Segmental and somatic dysfunction of sacral region: Secondary | ICD-10-CM | POA: Diagnosis not present

## 2015-06-28 DIAGNOSIS — M533 Sacrococcygeal disorders, not elsewhere classified: Secondary | ICD-10-CM | POA: Diagnosis not present

## 2015-06-28 DIAGNOSIS — M9901 Segmental and somatic dysfunction of cervical region: Secondary | ICD-10-CM | POA: Diagnosis not present

## 2015-07-02 DIAGNOSIS — M9901 Segmental and somatic dysfunction of cervical region: Secondary | ICD-10-CM | POA: Diagnosis not present

## 2015-07-02 DIAGNOSIS — M5416 Radiculopathy, lumbar region: Secondary | ICD-10-CM | POA: Diagnosis not present

## 2015-07-02 DIAGNOSIS — M9903 Segmental and somatic dysfunction of lumbar region: Secondary | ICD-10-CM | POA: Diagnosis not present

## 2015-07-02 DIAGNOSIS — M533 Sacrococcygeal disorders, not elsewhere classified: Secondary | ICD-10-CM | POA: Diagnosis not present

## 2015-07-02 DIAGNOSIS — M9904 Segmental and somatic dysfunction of sacral region: Secondary | ICD-10-CM | POA: Diagnosis not present

## 2015-07-09 DIAGNOSIS — M533 Sacrococcygeal disorders, not elsewhere classified: Secondary | ICD-10-CM | POA: Diagnosis not present

## 2015-07-09 DIAGNOSIS — Z79899 Other long term (current) drug therapy: Secondary | ICD-10-CM | POA: Diagnosis not present

## 2015-07-09 DIAGNOSIS — M9903 Segmental and somatic dysfunction of lumbar region: Secondary | ICD-10-CM | POA: Diagnosis not present

## 2015-07-09 DIAGNOSIS — M9901 Segmental and somatic dysfunction of cervical region: Secondary | ICD-10-CM | POA: Diagnosis not present

## 2015-07-09 DIAGNOSIS — M9904 Segmental and somatic dysfunction of sacral region: Secondary | ICD-10-CM | POA: Diagnosis not present

## 2015-07-09 DIAGNOSIS — M5416 Radiculopathy, lumbar region: Secondary | ICD-10-CM | POA: Diagnosis not present

## 2015-07-13 DIAGNOSIS — E668 Other obesity: Secondary | ICD-10-CM | POA: Diagnosis not present

## 2015-07-13 DIAGNOSIS — R600 Localized edema: Secondary | ICD-10-CM | POA: Diagnosis not present

## 2015-07-13 DIAGNOSIS — M5441 Lumbago with sciatica, right side: Secondary | ICD-10-CM | POA: Diagnosis not present

## 2015-07-13 DIAGNOSIS — K5900 Constipation, unspecified: Secondary | ICD-10-CM | POA: Diagnosis not present

## 2015-07-13 DIAGNOSIS — I10 Essential (primary) hypertension: Secondary | ICD-10-CM | POA: Diagnosis not present

## 2015-07-16 DIAGNOSIS — M9904 Segmental and somatic dysfunction of sacral region: Secondary | ICD-10-CM | POA: Diagnosis not present

## 2015-07-16 DIAGNOSIS — M9901 Segmental and somatic dysfunction of cervical region: Secondary | ICD-10-CM | POA: Diagnosis not present

## 2015-07-16 DIAGNOSIS — M9903 Segmental and somatic dysfunction of lumbar region: Secondary | ICD-10-CM | POA: Diagnosis not present

## 2015-07-16 DIAGNOSIS — M5416 Radiculopathy, lumbar region: Secondary | ICD-10-CM | POA: Diagnosis not present

## 2015-07-16 DIAGNOSIS — M533 Sacrococcygeal disorders, not elsewhere classified: Secondary | ICD-10-CM | POA: Diagnosis not present

## 2015-07-24 DIAGNOSIS — M9903 Segmental and somatic dysfunction of lumbar region: Secondary | ICD-10-CM | POA: Diagnosis not present

## 2015-07-24 DIAGNOSIS — M5416 Radiculopathy, lumbar region: Secondary | ICD-10-CM | POA: Diagnosis not present

## 2015-07-24 DIAGNOSIS — M9904 Segmental and somatic dysfunction of sacral region: Secondary | ICD-10-CM | POA: Diagnosis not present

## 2015-07-24 DIAGNOSIS — M533 Sacrococcygeal disorders, not elsewhere classified: Secondary | ICD-10-CM | POA: Diagnosis not present

## 2015-07-24 DIAGNOSIS — M9901 Segmental and somatic dysfunction of cervical region: Secondary | ICD-10-CM | POA: Diagnosis not present

## 2015-07-30 DIAGNOSIS — M542 Cervicalgia: Secondary | ICD-10-CM | POA: Diagnosis not present

## 2015-07-31 DIAGNOSIS — H538 Other visual disturbances: Secondary | ICD-10-CM | POA: Diagnosis not present

## 2015-07-31 DIAGNOSIS — M9901 Segmental and somatic dysfunction of cervical region: Secondary | ICD-10-CM | POA: Diagnosis not present

## 2015-07-31 DIAGNOSIS — M5416 Radiculopathy, lumbar region: Secondary | ICD-10-CM | POA: Diagnosis not present

## 2015-07-31 DIAGNOSIS — M9903 Segmental and somatic dysfunction of lumbar region: Secondary | ICD-10-CM | POA: Diagnosis not present

## 2015-07-31 DIAGNOSIS — M533 Sacrococcygeal disorders, not elsewhere classified: Secondary | ICD-10-CM | POA: Diagnosis not present

## 2015-07-31 DIAGNOSIS — M9904 Segmental and somatic dysfunction of sacral region: Secondary | ICD-10-CM | POA: Diagnosis not present

## 2015-07-31 DIAGNOSIS — H2511 Age-related nuclear cataract, right eye: Secondary | ICD-10-CM | POA: Diagnosis not present

## 2015-07-31 DIAGNOSIS — H1011 Acute atopic conjunctivitis, right eye: Secondary | ICD-10-CM | POA: Diagnosis not present

## 2015-08-01 ENCOUNTER — Ambulatory Visit: Payer: Self-pay | Admitting: Gynecology

## 2015-08-02 DIAGNOSIS — Z1211 Encounter for screening for malignant neoplasm of colon: Secondary | ICD-10-CM | POA: Diagnosis not present

## 2015-08-02 DIAGNOSIS — D125 Benign neoplasm of sigmoid colon: Secondary | ICD-10-CM | POA: Diagnosis not present

## 2015-08-02 DIAGNOSIS — K5730 Diverticulosis of large intestine without perforation or abscess without bleeding: Secondary | ICD-10-CM | POA: Diagnosis not present

## 2015-08-02 DIAGNOSIS — R1310 Dysphagia, unspecified: Secondary | ICD-10-CM | POA: Diagnosis not present

## 2015-08-02 DIAGNOSIS — K635 Polyp of colon: Secondary | ICD-10-CM | POA: Diagnosis not present

## 2015-08-02 DIAGNOSIS — Z86010 Personal history of colonic polyps: Secondary | ICD-10-CM | POA: Diagnosis not present

## 2015-08-02 DIAGNOSIS — K219 Gastro-esophageal reflux disease without esophagitis: Secondary | ICD-10-CM | POA: Diagnosis not present

## 2015-08-02 DIAGNOSIS — D122 Benign neoplasm of ascending colon: Secondary | ICD-10-CM | POA: Diagnosis not present

## 2015-08-03 DIAGNOSIS — M542 Cervicalgia: Secondary | ICD-10-CM | POA: Diagnosis not present

## 2015-08-06 DIAGNOSIS — M542 Cervicalgia: Secondary | ICD-10-CM | POA: Diagnosis not present

## 2015-08-07 DIAGNOSIS — M5416 Radiculopathy, lumbar region: Secondary | ICD-10-CM | POA: Diagnosis not present

## 2015-08-07 DIAGNOSIS — M9901 Segmental and somatic dysfunction of cervical region: Secondary | ICD-10-CM | POA: Diagnosis not present

## 2015-08-07 DIAGNOSIS — M9904 Segmental and somatic dysfunction of sacral region: Secondary | ICD-10-CM | POA: Diagnosis not present

## 2015-08-07 DIAGNOSIS — M9903 Segmental and somatic dysfunction of lumbar region: Secondary | ICD-10-CM | POA: Diagnosis not present

## 2015-08-07 DIAGNOSIS — M533 Sacrococcygeal disorders, not elsewhere classified: Secondary | ICD-10-CM | POA: Diagnosis not present

## 2015-08-09 DIAGNOSIS — F322 Major depressive disorder, single episode, severe without psychotic features: Secondary | ICD-10-CM | POA: Diagnosis not present

## 2015-08-09 DIAGNOSIS — J4520 Mild intermittent asthma, uncomplicated: Secondary | ICD-10-CM | POA: Diagnosis not present

## 2015-08-09 DIAGNOSIS — K219 Gastro-esophageal reflux disease without esophagitis: Secondary | ICD-10-CM | POA: Diagnosis not present

## 2015-08-09 DIAGNOSIS — I10 Essential (primary) hypertension: Secondary | ICD-10-CM | POA: Diagnosis not present

## 2015-08-09 DIAGNOSIS — M542 Cervicalgia: Secondary | ICD-10-CM | POA: Diagnosis not present

## 2015-08-09 DIAGNOSIS — R600 Localized edema: Secondary | ICD-10-CM | POA: Diagnosis not present

## 2015-08-13 DIAGNOSIS — M542 Cervicalgia: Secondary | ICD-10-CM | POA: Diagnosis not present

## 2015-08-16 ENCOUNTER — Encounter: Payer: Self-pay | Admitting: Gynecology

## 2015-08-16 ENCOUNTER — Other Ambulatory Visit (HOSPITAL_COMMUNITY)
Admission: RE | Admit: 2015-08-16 | Discharge: 2015-08-16 | Disposition: A | Discharge status: Home / Self Care | Payer: Medicare Other | Source: Ambulatory Visit | Attending: Gynecology | Admitting: Gynecology

## 2015-08-16 ENCOUNTER — Ambulatory Visit (INDEPENDENT_AMBULATORY_CARE_PROVIDER_SITE_OTHER): Payer: Medicare Other | Admitting: Gynecology

## 2015-08-16 VITALS — BP 124/82 | Ht 68.5 in | Wt 319.0 lb

## 2015-08-16 DIAGNOSIS — Z780 Asymptomatic menopausal state: Secondary | ICD-10-CM | POA: Diagnosis not present

## 2015-08-16 DIAGNOSIS — Z01419 Encounter for gynecological examination (general) (routine) without abnormal findings: Secondary | ICD-10-CM | POA: Diagnosis not present

## 2015-08-16 DIAGNOSIS — Z124 Encounter for screening for malignant neoplasm of cervix: Secondary | ICD-10-CM | POA: Insufficient documentation

## 2015-08-16 DIAGNOSIS — M542 Cervicalgia: Secondary | ICD-10-CM | POA: Diagnosis not present

## 2015-08-16 DIAGNOSIS — L292 Pruritus vulvae: Secondary | ICD-10-CM

## 2015-08-16 LAB — WET PREP FOR TRICH, YEAST, CLUE
Clue Cells Wet Prep HPF POC: NONE SEEN
Trich, Wet Prep: NONE SEEN
WBC, Wet Prep HPF POC: NONE SEEN
Yeast Wet Prep HPF POC: NONE SEEN

## 2015-08-16 NOTE — Progress Notes (Addendum)
Kristen Shaffer [birth year] [id number]        58 y.o.  G2P0020 for breast and pelvic exam.  Several issues noted below.  Has not been seen in the office since 2011.  Past medical history,surgical history, problem list, medications, allergies, family history and social history were all reviewed and documented as reviewed in the EPIC chart.  ROS:  Performed with pertinent positives and negatives included in the history, assessment and plan.   Additional significant findings :  none   Exam: Kim Counsellor Vitals:   08/16/15 1517  BP: 124/82  Height: 5' 8.5" (1.74 m)  Weight: 319 lb (144.697 kg)   General appearance:  Normal affect, orientation and appearance. Skin: Grossly normal HEENT: Without gross lesions.  No cervical or supraclavicular adenopathy. Thyroid normal.  Lungs:  Clear without wheezing, rales or rhonchi Cardiac: RR, without RMG Abdominal:  Soft, nontender, without masses, guarding, rebound, organomegaly or hernia Breasts:  Examined lying and sitting without masses, retractions, discharge or axillary adenopathy. Pelvic:  Ext/BUS/vagina with atrophic changes. Symmetrical vitiligo from lower mons through groin creases labia and perianal region. White blanching and her aspects of her labia majora consistent with lichen sclerosis  Cervix normal. Pap smear done  Uterus difficult to palpate but no gross masses or tenderness  Adnexa  Without gross masses or tenderness    Anus and perineum  Normal   Rectovaginal  Normal sphincter tone without palpated masses or tenderness.    Assessment/Plan:  59 y.o. G42P0020 female for breast and pelvic exam.   1. 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 Was treated with progesterone and ultimately had to subsequent endometrial biopsies 2010 and 2011 which were negative. Was having irregular bleeding at that time. Has not had any bleeding since then. Had an elevated FSH per Dr. Valeta Harms note.  Reviewed with patient whether  anything further needed to be done at this time. Options for observation with reporting any bleeding to pursue workup versus ultrasound for endometrial echo up to including endometrial sampling. At this point we'll plan on endometrial sampling when she returns for her vulvar biopsy as in #2. 2. Chronic vaginal itching. Wet prep was negative. Exam highly suspicious for lichen sclerosis. Recommended patient follow up for biopsy diagnoses and then I discussed with her Temovate 0.05% cream treatment. Will prescribe if biopsy returns consistent with lichen sclerosis. 3. Pap smear 2011. Pap smear done today. No history of significant abnormal Pap smears previously. 4. Mammography 06/2015. Continue with annual mammography when due. SBE monthly reviewed. 5. DEXA 2013 normal. We'll plan repeat at 5 year interval. 6. Colonoscopy 2016. Repeat at their recommended interval. 7. Health maintenance. No routine blood work ordered as this is done at her primary physician's office. Follow up for biopsies as above.   Anastasio Auerbach MD, 4:06 PM 08/16/2015

## 2015-08-16 NOTE — Patient Instructions (Signed)
Follow up for vulvar biopsy and endometrial biopsy as scheduled.  You may obtain a copy of any labs that were done today by logging onto MyChart as outlined in the instructions provided with your AVS (after visit summary). The office will not call with normal lab results but certainly if there are any significant abnormalities then we will contact you.   Health Maintenance Adopting a healthy lifestyle and getting preventive care can go a long way to promote health and wellness. Talk with your health care provider about what schedule of regular examinations is right for you. This is a good chance for you to check in with your provider about disease prevention and staying healthy. In between checkups, there are plenty of things you can do on your own. Experts have done a lot of research about which lifestyle changes and preventive measures are most likely to keep you healthy. Ask your health care provider for more information. WEIGHT AND DIET  Eat a healthy diet  Be sure to include plenty of vegetables, fruits, low-fat dairy products, and lean protein.  Do not eat a lot of foods high in solid fats, added sugars, or salt.  Get regular exercise. This is one of the most important things you can do for your health.  Most adults should exercise for at least 150 minutes each week. The exercise should increase your heart rate and make you sweat (moderate-intensity exercise).  Most adults should also do strengthening exercises at least twice a week. This is in addition to the moderate-intensity exercise.  Maintain a healthy weight  Body mass index (BMI) is a measurement that can be used to identify possible weight problems. It estimates body fat based on height and weight. Your health care provider can help determine your BMI and help you achieve or maintain a healthy weight.  For females 67 years of age and older:   A BMI below 18.5 is considered underweight.  A BMI of 18.5 to 24.9 is  normal.  A BMI of 25 to 29.9 is considered overweight.  A BMI of 30 and above is considered obese.  Watch levels of cholesterol and blood lipids  You should start having your blood tested for lipids and cholesterol at 61 years of age, then have this test every 5 years.  You may need to have your cholesterol levels checked more often if:  Your lipid or cholesterol levels are high.  You are older than 61 years of age.  You are at high risk for heart disease.  CANCER SCREENING   Lung Cancer  Lung cancer screening is recommended for adults 44-10 years old who are at high risk for lung cancer because of a history of smoking.  A yearly low-dose CT scan of the lungs is recommended for people who:  Currently smoke.  Have quit within the past 15 years.  Have at least a 30-pack-year history of smoking. A pack year is smoking an average of one pack of cigarettes a day for 1 year.  Yearly screening should continue until it has been 15 years since you quit.  Yearly screening should stop if you develop a health problem that would prevent you from having lung cancer treatment.  Breast Cancer  Practice breast self-awareness. This means understanding how your breasts normally appear and feel.  It also means doing regular breast self-exams. Let your health care provider know about any changes, no matter how small.  If you are in your 20s or 30s, you should have a  clinical breast exam (CBE) by a health care provider every 1-3 years as part of a regular health exam.  If you are 51 or older, have a CBE every year. Also consider having a breast X-ray (mammogram) every year.  If you have a family history of breast cancer, talk to your health care provider about genetic screening.  If you are at high risk for breast cancer, talk to your health care provider about having an MRI and a mammogram every year.  Breast cancer gene (BRCA) assessment is recommended for women who have family members  with BRCA-related cancers. BRCA-related cancers include:  Breast.  Ovarian.  Tubal.  Peritoneal cancers.  Results of the assessment will determine the need for genetic counseling and BRCA1 and BRCA2 testing. Cervical Cancer Routine pelvic examinations to screen for cervical cancer are no longer recommended for nonpregnant women who are considered low risk for cancer of the pelvic organs (ovaries, uterus, and vagina) and who do not have symptoms. A pelvic examination may be necessary if you have symptoms including those associated with pelvic infections. Ask your health care provider if a screening pelvic exam is right for you.   The Pap test is the screening test for cervical cancer for women who are considered at risk.  If you had a hysterectomy for a problem that was not cancer or a condition that could lead to cancer, then you no longer need Pap tests.  If you are older than 65 years, and you have had normal Pap tests for the past 10 years, you no longer need to have Pap tests.  If you have had past treatment for cervical cancer or a condition that could lead to cancer, you need Pap tests and screening for cancer for at least 20 years after your treatment.  If you no longer get a Pap test, assess your risk factors if they change (such as having a new sexual partner). This can affect whether you should start being screened again.  Some women have medical problems that increase their chance of getting cervical cancer. If this is the case for you, your health care provider may recommend more frequent screening and Pap tests.  The human papillomavirus (HPV) test is another test that may be used for cervical cancer screening. The HPV test looks for the virus that can cause cell changes in the cervix. The cells collected during the Pap test can be tested for HPV.  The HPV test can be used to screen women 72 years of age and older. Getting tested for HPV can extend the interval between normal  Pap tests from three to five years.  An HPV test also should be used to screen women of any age who have unclear Pap test results.  After 61 years of age, women should have HPV testing as often as Pap tests.  Colorectal Cancer  This type of cancer can be detected and often prevented.  Routine colorectal cancer screening usually begins at 61 years of age and continues through 61 years of age.  Your health care provider may recommend screening at an earlier age if you have risk factors for colon cancer.  Your health care provider may also recommend using home test kits to check for hidden blood in the stool.  A small camera at the end of a tube can be used to examine your colon directly (sigmoidoscopy or colonoscopy). This is done to check for the earliest forms of colorectal cancer.  Routine screening usually begins at  age 76.  Direct examination of the colon should be repeated every 5-10 years through 61 years of age. However, you may need to be screened more often if early forms of precancerous polyps or small growths are found. Skin Cancer  Check your skin from head to toe regularly.  Tell your health care provider about any new moles or changes in moles, especially if there is a change in a mole's shape or color.  Also tell your health care provider if you have a mole that is larger than the size of a pencil eraser.  Always use sunscreen. Apply sunscreen liberally and repeatedly throughout the day.  Protect yourself by wearing long sleeves, pants, a wide-brimmed hat, and sunglasses whenever you are outside. HEART DISEASE, DIABETES, AND HIGH BLOOD PRESSURE   Have your blood pressure checked at least every 1-2 years. High blood pressure causes heart disease and increases the risk of stroke.  If you are between 33 years and 45 years old, ask your health care provider if you should take aspirin to prevent strokes.  Have regular diabetes screenings. This involves taking a blood  sample to check your fasting blood sugar level.  If you are at a normal weight and have a low risk for diabetes, have this test once every three years after 61 years of age.  If you are overweight and have a high risk for diabetes, consider being tested at a younger age or more often. PREVENTING INFECTION  Hepatitis B  If you have a higher risk for hepatitis B, you should be screened for this virus. You are considered at high risk for hepatitis B if:  You were born in a country where hepatitis B is common. Ask your health care provider which countries are considered high risk.  Your parents were born in a high-risk country, and you have not been immunized against hepatitis B (hepatitis B vaccine).  You have HIV or AIDS.  You use needles to inject street drugs.  You live with someone who has hepatitis B.  You have had sex with someone who has hepatitis B.  You get hemodialysis treatment.  You take certain medicines for conditions, including cancer, organ transplantation, and autoimmune conditions. Hepatitis C  Blood testing is recommended for:  Everyone born from [birth year] through 1967.  Anyone with known risk factors for hepatitis C. Sexually transmitted infections (STIs)  You should be screened for sexually transmitted infections (STIs) including gonorrhea and chlamydia if:  You are sexually active and are younger than 61 years of age.  You are older than 61 years of age and your health care provider tells you that you are at risk for this type of infection.  Your sexual activity has changed since you were last screened and you are at an increased risk for chlamydia or gonorrhea. Ask your health care provider if you are at risk.  If you do not have HIV, but are at risk, it may be recommended that you take a prescription medicine daily to prevent HIV infection. This is called pre-exposure prophylaxis (PrEP). You are considered at risk if:  You are sexually active and do not  regularly use condoms or know the HIV status of your partner(s).  You take drugs by injection.  You are sexually active with a partner who has HIV. Talk with your health care provider about whether you are at high risk of being infected with HIV. If you choose to begin PrEP, you should first be tested for HIV. You  should then be tested every 3 months for as long as you are taking PrEP.  PREGNANCY   If you are premenopausal and you may become pregnant, ask your health care provider about preconception counseling.  If you may become pregnant, take 400 to 800 micrograms (mcg) of folic acid every day.  If you want to prevent pregnancy, talk to your health care provider about birth control (contraception). OSTEOPOROSIS AND MENOPAUSE   Osteoporosis is a disease in which the bones lose minerals and strength with aging. This can result in serious bone fractures. Your risk for osteoporosis can be identified using a bone density scan.  If you are 17 years of age or older, or if you are at risk for osteoporosis and fractures, ask your health care provider if you should be screened.  Ask your health care provider whether you should take a calcium or vitamin D supplement to lower your risk for osteoporosis.  Menopause may have certain physical symptoms and risks.  Hormone replacement therapy may reduce some of these symptoms and risks. Talk to your health care provider about whether hormone replacement therapy is right for you.  HOME CARE INSTRUCTIONS   Schedule regular health, dental, and eye exams.  Stay current with your immunizations.   Do not use any tobacco products including cigarettes, chewing tobacco, or electronic cigarettes.  If you are pregnant, do not drink alcohol.  If you are breastfeeding, limit how much and how often you drink alcohol.  Limit alcohol intake to no more than 1 drink per day for nonpregnant women. One drink equals 12 ounces of beer, 5 ounces of wine, or 1  ounces of hard liquor.  Do not use street drugs.  Do not share needles.  Ask your health care provider for help if you need support or information about quitting drugs.  Tell your health care provider if you often feel depressed.  Tell your health care provider if you have ever been abused or do not feel safe at home. Document Released: 05/19/2011 Document Revised: 03/20/2014 Document Reviewed: 10/05/2013 [facility] Patient Information 2015 [place], [place]. This information is not intended to replace advice given to you by your health care provider. Make sure you discuss any questions you have with your health care provider.

## 2015-08-16 NOTE — Addendum Note (Signed)
Addended by: Nelva Nay on: 08/16/2015 04:22 PM   Modules accepted: Orders

## 2015-08-17 LAB — URINALYSIS W MICROSCOPIC + REFLEX CULTURE
Bilirubin Urine: NEGATIVE
Casts: NONE SEEN [LPF]
Crystals: NONE SEEN [HPF]
Glucose, UA: NEGATIVE
Ketones, ur: NEGATIVE
Nitrite: NEGATIVE
Protein, ur: NEGATIVE
RBC / HPF: NONE SEEN RBC/HPF (ref <=2)
Specific Gravity, Urine: 1.03 (ref 1.001–1.035)
Yeast: NONE SEEN [HPF]
pH: 5.5 (ref 5.0–8.0)

## 2015-08-19 LAB — URINE CULTURE: Colony Count: >=100000

## 2015-08-20 ENCOUNTER — Other Ambulatory Visit: Payer: Self-pay | Admitting: Gynecology

## 2015-08-20 DIAGNOSIS — M542 Cervicalgia: Secondary | ICD-10-CM | POA: Diagnosis not present

## 2015-08-20 LAB — CYTOLOGY - PAP

## 2015-08-20 MED ORDER — NITROFURANTOIN MONOHYD MACRO 100 MG PO CAPS: 100.0000 mg | ORAL_CAPSULE | Freq: Two times a day (BID) | ORAL | Status: DC

## 2015-08-22 DIAGNOSIS — M542 Cervicalgia: Secondary | ICD-10-CM | POA: Diagnosis not present

## 2015-08-24 DIAGNOSIS — D171 Benign lipomatous neoplasm of skin and subcutaneous tissue of trunk: Secondary | ICD-10-CM | POA: Diagnosis not present

## 2015-08-24 DIAGNOSIS — I8312 Varicose veins of left lower extremity with inflammation: Secondary | ICD-10-CM | POA: Diagnosis not present

## 2015-08-24 DIAGNOSIS — D1739 Benign lipomatous neoplasm of skin and subcutaneous tissue of other sites: Secondary | ICD-10-CM | POA: Diagnosis not present

## 2015-08-24 DIAGNOSIS — L83 Acanthosis nigricans: Secondary | ICD-10-CM | POA: Diagnosis not present

## 2015-08-24 DIAGNOSIS — I8311 Varicose veins of right lower extremity with inflammation: Secondary | ICD-10-CM | POA: Diagnosis not present

## 2015-08-24 DIAGNOSIS — I872 Venous insufficiency (chronic) (peripheral): Secondary | ICD-10-CM | POA: Diagnosis not present

## 2015-08-27 ENCOUNTER — Telehealth: Payer: Self-pay

## 2015-08-27 DIAGNOSIS — M542 Cervicalgia: Secondary | ICD-10-CM | POA: Diagnosis not present

## 2015-08-27 NOTE — Telephone Encounter (Signed)
Patient called to inquire if sensitivity from urine culture has returned. I told her it had and it did show bacteria was sensitive to Macrobid so she should take the Rx we sent in for her.

## 2015-08-28 ENCOUNTER — Telehealth: Payer: Self-pay

## 2015-08-28 ENCOUNTER — Other Ambulatory Visit: Payer: Self-pay | Admitting: Gynecology

## 2015-08-28 MED ORDER — SULFAMETHOXAZOLE-TRIMETHOPRIM 800-160 MG PO TABS: 1.0000 | ORAL_TABLET | Freq: Two times a day (BID) | ORAL | Status: DC

## 2015-08-28 NOTE — Telephone Encounter (Addendum)
Patient advised. Rx sent. Patient wanted to try the Septra DS.  I called pharmacy and cancelled the [place] rx.

## 2015-08-28 NOTE — Telephone Encounter (Signed)
Sensitivities show that it is sensitive to Macrobid. It also is sensitive to Septra. If she wants to try a different one then she can try Septra DS 1 by mouth twice a day 7 days.  Either way I think is okay.

## 2015-08-28 NOTE — Telephone Encounter (Signed)
Patient was prescribed Macrobid yesterday for UTI.  She had wanted to wait for sensitivity before picking it up.  She talked with pharmacist and realized this is same Rx she took in July and August with her PCP for UTI.  She feels like it is not knocking the infection out. She questioned "could I be resistant to it or maybe generic not working".  She asked if there was something different you could prescribe for her to try.

## 2015-08-30 DIAGNOSIS — M542 Cervicalgia: Secondary | ICD-10-CM | POA: Diagnosis not present

## 2015-09-06 ENCOUNTER — Ambulatory Visit (INDEPENDENT_AMBULATORY_CARE_PROVIDER_SITE_OTHER): Payer: Medicare Other | Admitting: Gynecology

## 2015-09-06 ENCOUNTER — Encounter: Payer: Self-pay | Admitting: Gynecology

## 2015-09-06 VITALS — BP 124/84

## 2015-09-06 DIAGNOSIS — L292 Pruritus vulvae: Secondary | ICD-10-CM

## 2015-09-06 DIAGNOSIS — N8501 Benign endometrial hyperplasia: Secondary | ICD-10-CM | POA: Diagnosis not present

## 2015-09-06 DIAGNOSIS — N904 Leukoplakia of vulva: Secondary | ICD-10-CM | POA: Diagnosis not present

## 2015-09-06 DIAGNOSIS — N888 Other specified noninflammatory disorders of cervix uteri: Secondary | ICD-10-CM | POA: Diagnosis not present

## 2015-09-06 DIAGNOSIS — R21 Rash and other nonspecific skin eruption: Secondary | ICD-10-CM

## 2015-09-06 NOTE — Addendum Note (Signed)
Addended by: Anastasio Auerbach on: 09/06/2015 12:55 PM   Modules accepted: Orders

## 2015-09-06 NOTE — Progress Notes (Addendum)
Kristen Shaffer 1956-02-03 [id number]        58 y.o.  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  Past medical history,surgical history, problem list, medications, allergies, family history and social history were all reviewed and documented in the EPIC chart.  Directed ROS with pertinent positives and negatives documented in the history of present illness/assessment and plan.  Exam: [place] assistant Filed Vitals:   09/06/15 1205  BP: 124/84   General appearance:  Normal External BUS vagina with symmetrical vitiligo from peri-clitoral to peri-anal area.  Areas of white blanched skin inner upper labia majora bilaterally.  Vagina normal. Cervix grossly normal. Bimanual uterus difficult to palpate but no gross masses or tenderness. Adnexa without gross masses or tenderness. Physical Exam  Genitourinary:        Procedure:  The skin in the upper inner right labia majora was cleansed with Betadine, infiltrated with 1% Xylocaine and a representative area of the blanched white skin was excised. Silver nitrate hemostasis applied afterwards. Subsequently the cervix was visualized with a speculum, cleansed with Betadine single-toothed anterior lip stabilization disposable dilator dilatation and subsequent [place] endometrial biopsy was performed. Sounded approximately 7-8 cm with scant return 2 passes. Patient tolerated well.  Assessment/Plan:  59 y.o. G2P0020 with history of complex hyperplasia. Endometrial biopsy done. Scant return. Will follow up for results. Area white blanched skin along with fairly classic vitiligo noted. Suspect lichen sclerosus. Biopsy taken. Patient will follow up for biopsy results and probable Temovate 0.05% treatment. Patient knows to call if she does not hear from the biopsy results within  several days.    Anastasio Auerbach MD, 12:24 PM 09/06/2015

## 2015-09-06 NOTE — Patient Instructions (Signed)
Office will call you with biopsy results 

## 2015-09-10 ENCOUNTER — Other Ambulatory Visit: Payer: Self-pay | Admitting: Gynecology

## 2015-09-10 DIAGNOSIS — N8500 Endometrial hyperplasia, unspecified: Secondary | ICD-10-CM

## 2015-09-10 DIAGNOSIS — M542 Cervicalgia: Secondary | ICD-10-CM | POA: Diagnosis not present

## 2015-09-10 MED ORDER — CLOBETASOL PROPIONATE 0.05 % EX CREA: TOPICAL_CREAM | CUTANEOUS | Status: DC

## 2015-09-13 DIAGNOSIS — M542 Cervicalgia: Secondary | ICD-10-CM | POA: Diagnosis not present

## 2015-09-17 DIAGNOSIS — M542 Cervicalgia: Secondary | ICD-10-CM | POA: Diagnosis not present

## 2015-09-19 ENCOUNTER — Other Ambulatory Visit: Payer: Self-pay | Admitting: Gynecology

## 2015-09-19 DIAGNOSIS — N8501 Benign endometrial hyperplasia: Secondary | ICD-10-CM

## 2015-09-20 ENCOUNTER — Telehealth: Payer: Self-pay | Admitting: Gynecology

## 2015-09-20 DIAGNOSIS — I10 Essential (primary) hypertension: Secondary | ICD-10-CM | POA: Diagnosis not present

## 2015-09-20 DIAGNOSIS — Z131 Encounter for screening for diabetes mellitus: Secondary | ICD-10-CM | POA: Diagnosis not present

## 2015-09-20 DIAGNOSIS — E559 Vitamin D deficiency, unspecified: Secondary | ICD-10-CM | POA: Diagnosis not present

## 2015-09-20 DIAGNOSIS — J4520 Mild intermittent asthma, uncomplicated: Secondary | ICD-10-CM | POA: Diagnosis not present

## 2015-09-20 DIAGNOSIS — Z13220 Encounter for screening for lipoid disorders: Secondary | ICD-10-CM | POA: Diagnosis not present

## 2015-09-20 DIAGNOSIS — R600 Localized edema: Secondary | ICD-10-CM | POA: Diagnosis not present

## 2015-09-20 NOTE — Telephone Encounter (Signed)
09/20/15-I LM VM for pt that her [employer] ins covers the 58340 with a $40 copay. She is responsible for 20% coins on the 2 ultrasounds needed of $114.62. She will call if this is a problem. Per Baker Janus @ [employer], Ref #[id number]/wl

## 2015-09-21 ENCOUNTER — Telehealth: Payer: Self-pay | Admitting: Gynecology

## 2015-09-21 DIAGNOSIS — M542 Cervicalgia: Secondary | ICD-10-CM | POA: Diagnosis not present

## 2015-09-21 NOTE — Telephone Encounter (Signed)
09/21/15-After giving pt her responsibility for the test, she states she is having a lot of medical bills and asked if she could pay her $40.00 copay for the test after 10/03/15 when she gets her disability check and then make payments on the remaining $114.62 coins. I checked with Gretta Arab and due To the nature of her dx which needs follow up we could do this for her. I called the patient with this information.wl

## 2015-09-24 DIAGNOSIS — M542 Cervicalgia: Secondary | ICD-10-CM | POA: Diagnosis not present

## 2015-09-27 ENCOUNTER — Ambulatory Visit (INDEPENDENT_AMBULATORY_CARE_PROVIDER_SITE_OTHER): Payer: Medicare Other

## 2015-09-27 ENCOUNTER — Ambulatory Visit: Payer: Medicare Other | Admitting: Gynecology

## 2015-09-27 ENCOUNTER — Other Ambulatory Visit: Payer: Medicare Other

## 2015-09-27 ENCOUNTER — Ambulatory Visit (INDEPENDENT_AMBULATORY_CARE_PROVIDER_SITE_OTHER): Payer: Medicare Other | Admitting: Gynecology

## 2015-09-27 ENCOUNTER — Encounter: Payer: Self-pay | Admitting: Gynecology

## 2015-09-27 ENCOUNTER — Other Ambulatory Visit: Payer: Self-pay | Admitting: Gynecology

## 2015-09-27 VITALS — BP 130/82

## 2015-09-27 DIAGNOSIS — N858 Other specified noninflammatory disorders of uterus: Secondary | ICD-10-CM | POA: Diagnosis not present

## 2015-09-27 DIAGNOSIS — N9489 Other specified conditions associated with female genital organs and menstrual cycle: Secondary | ICD-10-CM

## 2015-09-27 DIAGNOSIS — M542 Cervicalgia: Secondary | ICD-10-CM | POA: Diagnosis not present

## 2015-09-27 DIAGNOSIS — R8279 Other abnormal findings on microbiological examination of urine: Secondary | ICD-10-CM | POA: Diagnosis not present

## 2015-09-27 DIAGNOSIS — N8501 Benign endometrial hyperplasia: Secondary | ICD-10-CM

## 2015-09-27 DIAGNOSIS — D251 Intramural leiomyoma of uterus: Secondary | ICD-10-CM | POA: Diagnosis not present

## 2015-09-27 DIAGNOSIS — N840 Polyp of corpus uteri: Secondary | ICD-10-CM

## 2015-09-27 NOTE — Progress Notes (Signed)
Kristen Shaffer 1956-03-11 [id number]        58 y.o.  G2P0020 presents for sonohysterogram. 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 Treated with progesterone with subsequent follow up biopsies 2 negative. Given her history of the hyperplasia a surveillance biopsy was done 09/06/2015 which returned with blood but no endometrial tissue.  Also had vitiligo type vulvar changes and a vulvar biopsy returned lichen sclerosis.  Past medical history,surgical history, problem list, medications, allergies, family history and social history were all reviewed and documented in the EPIC chart.  Directed ROS with pertinent positives and negatives documented in the history of present illness/assessment and plan.  Exam: [place] assistant Filed Vitals:   09/27/15 1456  BP: 130/82   General appearance:  Normal Abdomen soft nontender without masses guarding rebound Pelvic external BUS vagina with atrophic changes. Cervix grossly normal.  Ultrasound shows uterus with multiple small myomas 93mm, 26 mm, 25 mm, 23 mm. Right and left ovaries grossly normal. Endometrial echo 11.7 mm.  Sonohysterogram performed, sterile technique, easy catheter introduction, poor distention with fluid quickly leaving the cavity. Appears to outline a symmetrical defect 36 x 25 mm suggesting a submucous myoma versus polyp. Endometrial biopsy taken. Patient tolerated well.  Assessment/Plan:  59 y.o. G2P0020 with:  1. History of complex hyperplasia without atypia. Intracavitary defect suggesting will either large polyp or submucous myoma. Endometrial sample taken. Patient will follow up for the biopsy results. Recommend proceeding with hysteroscopy D&C with resection of the endometrial defect.  Patient has had this dross could be D&C in the past and understands the procedure. We'll go ahead and schedule this and she'll follow up for a preoperative consult before hand. 2. Lichen sclerosus. Had prescribed Temovate 0.05% cream  but she has not started this yet. Patient is going to get the prescription and start this nightly and then taper and see how she does from the irritative standpoint. 3. Difficulty emptying her bladder. Patient notes for years she's had difficulty emptying her bladder. We'll check urinalysis and refer to urology.    Anastasio Auerbach MD, 4:58 PM 09/27/2015

## 2015-09-27 NOTE — Patient Instructions (Signed)
Office will call you with biopsy results and to arrange the D&C. 

## 2015-09-28 ENCOUNTER — Telehealth: Payer: Self-pay | Admitting: *Deleted

## 2015-09-28 ENCOUNTER — Telehealth: Payer: Self-pay

## 2015-09-28 DIAGNOSIS — M1712 Unilateral primary osteoarthritis, left knee: Secondary | ICD-10-CM | POA: Diagnosis not present

## 2015-09-28 DIAGNOSIS — M1711 Unilateral primary osteoarthritis, right knee: Secondary | ICD-10-CM | POA: Diagnosis not present

## 2015-09-28 DIAGNOSIS — M545 Low back pain: Secondary | ICD-10-CM | POA: Diagnosis not present

## 2015-09-28 DIAGNOSIS — M19041 Primary osteoarthritis, right hand: Secondary | ICD-10-CM | POA: Diagnosis not present

## 2015-09-28 LAB — URINALYSIS W MICROSCOPIC + REFLEX CULTURE
Bacteria, UA: NONE SEEN [HPF]
Bilirubin Urine: NEGATIVE
Casts: NONE SEEN [LPF]
Crystals: NONE SEEN [HPF]
Glucose, UA: NEGATIVE
Hgb urine dipstick: NEGATIVE
Nitrite: NEGATIVE
Specific Gravity, Urine: 1.031 (ref 1.001–1.035)
Yeast: NONE SEEN [HPF]
pH: 5.5 (ref 5.0–8.0)

## 2015-09-28 LAB — URINE CULTURE: Colony Count: 30000

## 2015-09-28 MED ORDER — MISOPROSTOL 200 MCG PO TABS: ORAL_TABLET | ORAL | Status: DC

## 2015-09-28 NOTE — Telephone Encounter (Signed)
Referral faxed to [facility] they will fax me back with time and date.

## 2015-09-28 NOTE — Telephone Encounter (Signed)
-----   Message from Anastasio Auerbach, MD sent at 09/27/2015  5:05 PM EST ----- Urology referral reference difficulty with bladder emptying

## 2015-09-28 NOTE — Telephone Encounter (Signed)
I spoke with patient and we scheduled her surgery for Dec 6 at 10:00am at [facility].  Pre op consult was scheduled for 10/16/15.

## 2015-10-04 NOTE — Telephone Encounter (Signed)
Pt informed with the below note. 

## 2015-10-05 ENCOUNTER — Telehealth: Payer: Self-pay

## 2015-10-05 NOTE — Telephone Encounter (Signed)
Patient is scheduled for Hyst D&C on 10/23/15.  She called today stating she thinks she would like to have partial hysterectomy. She said that she has had polyp removed before as well as a history of myomectomy.  She thinks might be best to do hysterectomy.  She said that she hesitated at all to do surgery because of history of chronic pain in her back, neck and knees but she thinks she wants to do hysterectomy.  She wants to "have a conversation with you about this".  She assumed you might call her.  I told her to keep her pre op consult appointment with you on 10/16/15 to discuss this.  I do not think there is anyway I can fit that in on our block Tuesday Dec 6. It will have to be a later date if it is something you think is appropriate.

## 2015-10-08 NOTE — Telephone Encounter (Signed)
At this point I do not think a hysterectomy is the right choice. When compared to hysteroscopy it carries a higher risk of surgical complications and a much longer recovery time.

## 2015-10-09 NOTE — Telephone Encounter (Signed)
Left detailed message on voice mail advising this. Encouraged her to keep pre op consult as scheduled to discuss.

## 2015-10-15 NOTE — Patient Instructions (Addendum)
Your procedure is scheduled on:  Tuesday, Dec. 6, 2016  Enter through the [employer] [facility] at:  9:30 AM  Pick up the phone at the desk and dial [phone].  Call this number if you have problems the morning of surgery: [phone].  Remember: Do NOT eat food or drink after:  Midnight Monday, Dec. 5, 2016 Take these medicines the morning of surgery with a SIP OF WATER:  None  Do NOT wear jewelry (body piercing), metal hair clips/bobby pins, make-up, or nail polish. Do NOT wear lotions, powders, or perfumes.  You may wear deoderant. Do NOT shave for 48 hours prior to surgery. Do NOT bring valuables to the hospital. Contacts, dentures, or bridgework may not be worn into surgery. Have a responsible adult drive you home and stay with you for 24 hours after your procedure  Place misoprostol in vagina night before surgery

## 2015-10-16 ENCOUNTER — Encounter (HOSPITAL_COMMUNITY): Payer: Self-pay

## 2015-10-16 ENCOUNTER — Encounter: Payer: Self-pay | Admitting: Gynecology

## 2015-10-16 ENCOUNTER — Ambulatory Visit (INDEPENDENT_AMBULATORY_CARE_PROVIDER_SITE_OTHER): Payer: Medicare Other | Admitting: Gynecology

## 2015-10-16 ENCOUNTER — Encounter (HOSPITAL_COMMUNITY)
Admission: RE | Admit: 2015-10-16 | Discharge: 2015-10-16 | Disposition: A | Discharge status: Home / Self Care | Payer: Medicare Other | Source: Ambulatory Visit | Attending: Gynecology | Admitting: Gynecology

## 2015-10-16 ENCOUNTER — Telehealth: Payer: Self-pay

## 2015-10-16 VITALS — BP 140/82 | Ht 68.0 in | Wt 319.0 lb

## 2015-10-16 DIAGNOSIS — N840 Polyp of corpus uteri: Secondary | ICD-10-CM

## 2015-10-16 DIAGNOSIS — Z01818 Encounter for other preprocedural examination: Secondary | ICD-10-CM | POA: Insufficient documentation

## 2015-10-16 DIAGNOSIS — L900 Lichen sclerosus et atrophicus: Secondary | ICD-10-CM

## 2015-10-16 HISTORY — DX: Unspecified asthma, uncomplicated: J45.909

## 2015-10-16 HISTORY — DX: Anemia, unspecified: D64.9

## 2015-10-16 HISTORY — DX: Nausea with vomiting, unspecified: R11.2

## 2015-10-16 HISTORY — DX: Gastro-esophageal reflux disease without esophagitis: K21.9

## 2015-10-16 HISTORY — DX: Other specified postprocedural states: Z98.890

## 2015-10-16 HISTORY — DX: Rheumatic fever without heart involvement: I00

## 2015-10-16 HISTORY — DX: Nodules of vocal cords: J38.2

## 2015-10-16 HISTORY — DX: Localized edema: R60.0

## 2015-10-16 HISTORY — DX: Sciatica, unspecified side: M54.30

## 2015-10-16 HISTORY — DX: Lichen sclerosus et atrophicus: L90.0

## 2015-10-16 LAB — COMPREHENSIVE METABOLIC PANEL
ALT: 29 U/L (ref 14–54)
AST: 31 U/L (ref 15–41)
Albumin: 3.7 g/dL (ref 3.5–5.0)
Alkaline Phosphatase: 115 U/L (ref 38–126)
Anion gap: 8 (ref 5–15)
BUN: 17 mg/dL (ref 6–20)
CO2: 28 mmol/L (ref 22–32)
Calcium: 9.2 mg/dL (ref 8.9–10.3)
Chloride: 100 mmol/L — ABNORMAL LOW (ref 101–111)
Creatinine, Ser: 0.92 mg/dL (ref 0.44–1.00)
GFR calc Af Amer: >60 mL/min (ref >60)
GFR calc non Af Amer: >60 mL/min (ref >60)
Glucose, Bld: 133 mg/dL — ABNORMAL HIGH (ref 65–99)
Potassium: 3 mmol/L — ABNORMAL LOW (ref 3.5–5.1)
Sodium: 136 mmol/L (ref 135–145)
Total Bilirubin: 0.6 mg/dL (ref 0.3–1.2)
Total Protein: 7.4 g/dL (ref 6.5–8.1)

## 2015-10-16 LAB — CBC
HCT: 40.4 % (ref 36.0–46.0)
Hemoglobin: 13.2 g/dL (ref 12.0–15.0)
MCH: 25.9 pg — ABNORMAL LOW (ref 26.0–34.0)
MCHC: 32.7 g/dL (ref 30.0–36.0)
MCV: 79.2 fL (ref 78.0–100.0)
Platelets: 344 10*3/uL (ref 150–400)
RBC: 5.1 MIL/uL (ref 3.87–5.11)
RDW: 15.4 % (ref 11.5–15.5)
WBC: 11.3 10*3/uL — ABNORMAL HIGH (ref 4.0–10.5)

## 2015-10-16 NOTE — Progress Notes (Signed)
Kristen Shaffer 1956/04/12 [id number]   Preoperative consult  Chief complaint: history of complex hyperplasia with thickened endometrial echo.  History of present illness: 59 y.o. 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 Was treated with progesterone and had subsequent biopsies 2010/2011 which was negative. Has been amenorrheic with elevated FSH.  Had surveillance endometrial biopsy which showed no endometrial tissue. Subsequent sonohysterogram showed endometrial echo 11.7 mm with poor endometrial distention but what appeared to be asymmetrical defect 35 x 26 mm suggesting a submucous myoma versus polyp. Endometrial sample showed no endometrial tissue.  Patients admitted for hysteroscopy D&C with resection of any endometrial defects.  Past medical history,surgical history, medications, allergies, family history and social history were all reviewed and documented in the EPIC chart.  ROS:  Was performed and pertinent positives and negatives are included in the history of present illness.  Exam:  Wandra Scot assistant General: well developed, well nourished female, no acute distress HEENT: normal  Lungs: clear to auscultation without wheezing, rales or rhonchi  Cardiac: regular rate without rubs, murmurs or gallops  Abdomen: soft, nontender without gross masses, guarding, rebound, organomegaly  Pelvic: external bus vagina: symmetrical vitiligo consistent with her history of lichen sclerosis Cervix: grossly normal  Uterus: grossly normal but difficult to palpate due to abdominal girth  Adnexa: without gross masses or tenderness    Assessment/Plan:  59 y.o. G2P0020 with history of complex hyperplasia. Attempt at surveillance endometrial biopsies produced no tissue. Ultrasound showed thicker endometrial echo 11 mm in a postmenopausal patient. Patient submitted for hysteroscopy D&C with removal any encountered myoma/polyps as possible.  I reviewed the proposed surgery with  the patient to include the expected intraoperative and postoperative courses as well as the recovery period. The use of the hysteroscope, resectoscope and the D&C portion were all discussed. The risks of surgery to include infection, prolonged antibiotics, hemorrhage necessitating transfusion and the risks of transfusion, including transfusion reaction, hepatitis, HIV, mad cow disease and other unknown entities were all discussed understood and accepted. The risk of damage to internal organs during the procedure, either immediately recognized or delay recognized, including vagina, cervix, uterus, possible perforation causing damage to bowel, bladder, ureters, vessels and nerves necessitating major exploratory reparative surgery and future reparative surgeries including bladder repair, ureteral damage repair, bowel resection, ostomy formation was also discussed understood and accepted. The potential for distended media absorption leading to metabolic complications such as fluid overload, coma and seizures was also discussed understood and accepted. The patient's questions were answered to her satisfaction and she is ready to proceed with surgery.    Anastasio Auerbach MD, 11:34 AM 10/16/2015

## 2015-10-16 NOTE — Telephone Encounter (Signed)
Left message to call.

## 2015-10-16 NOTE — Telephone Encounter (Signed)
I sent out this already which went to the pool. Essentially start her on K dur 20 mEq 3 times a day through surgery and start ASAP.  #30

## 2015-10-16 NOTE — Pre-Procedure Instructions (Signed)
Juliann Pulse at Dr. Zelphia Cairo office made aware of patients potassium 3.0 and glucose 133.

## 2015-10-16 NOTE — Telephone Encounter (Signed)
Nurse from [facility] preadmitting called regarding results of CMET.  Potassium was 3.0 and glucose was 133.  Dr. Johnnye Sima requests that is potassium at 3.0 that she should be started on potassium supplement prior to surgery. She just wanted to make you aware of that recommendation.

## 2015-10-16 NOTE — Patient Instructions (Signed)
Followup for surgery as scheduled. 

## 2015-10-16 NOTE — H&P (Signed)
  Kristen Shaffer 06/19/1956 [id number]   History and Physical  Chief complaint: history of complex hyperplasia with thickened endometrial echo.  History of present illness: 59 y.o. 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 Was treated with progesterone and had subsequent biopsies 2010/2011 which was negative. Has been amenorrheic with elevated FSH.  Had surveillance endometrial biopsy which showed no endometrial tissue. Subsequent sonohysterogram showed endometrial echo 11.7 mm with poor endometrial distention but what appeared to be asymmetrical defect 35 x 26 mm suggesting a submucous myoma versus polyp. Endometrial sample showed no endometrial tissue.  Patients admitted for hysteroscopy D&C with resection of any endometrial defects.  Past medical history,surgical history, medications, allergies, family history and social history were all reviewed and documented in the EPIC chart.  ROS:  Was performed and pertinent positives and negatives are included in the history of present illness.  Exam:  Wandra Scot assistant  10/16/2015 General: well developed, well nourished female, no acute distress HEENT: normal  Lungs: clear to auscultation without wheezing, rales or rhonchi  Cardiac: regular rate without rubs, murmurs or gallops  Abdomen: soft, nontender without gross masses, guarding, rebound, organomegaly  Pelvic: external bus vagina: symmetrical vitiligo consistent with her history of lichen sclerosis Cervix: grossly normal  Uterus: grossly normal but difficult to palpate due to abdominal girth  Adnexa: without gross masses or tenderness    Assessment/Plan:  59 y.o. G2P0020 with history of complex hyperplasia. Attempt at surveillance endometrial biopsies produced no tissue. Ultrasound showed thicker endometrial echo 11 mm in a postmenopausal patient. Patient submitted for hysteroscopy D&C with removal any encountered myoma/polyps as possible.  I reviewed the proposed  surgery with the patient to include the expected intraoperative and postoperative courses as well as the recovery period. The use of the hysteroscope, resectoscope and the D&C portion were all discussed. The risks of surgery to include infection, prolonged antibiotics, hemorrhage necessitating transfusion and the risks of transfusion, including transfusion reaction, hepatitis, HIV, mad cow disease and other unknown entities were all discussed understood and accepted. The risk of damage to internal organs during the procedure, either immediately recognized or delay recognized, including vagina, cervix, uterus, possible perforation causing damage to bowel, bladder, ureters, vessels and nerves necessitating major exploratory reparative surgery and future reparative surgeries including bladder repair, ureteral damage repair, bowel resection, ostomy formation was also discussed understood and accepted. The potential for distended media absorption leading to metabolic complications such as fluid overload, coma and seizures was also discussed understood and accepted. The patient's questions were answered to her satisfaction and she is ready to proceed with surgery.     Anastasio Auerbach MD, 11:39 AM 10/16/2015

## 2015-10-17 ENCOUNTER — Telehealth: Payer: Self-pay

## 2015-10-17 ENCOUNTER — Other Ambulatory Visit: Payer: Self-pay | Admitting: Gynecology

## 2015-10-17 MED ORDER — POTASSIUM CHLORIDE CRYS ER 20 MEQ PO TBCR: 20.0000 meq | EXTENDED_RELEASE_TABLET | Freq: Three times a day (TID) | ORAL | Status: DC

## 2015-10-17 NOTE — Telephone Encounter (Signed)
Tomeka at [facility] was notified.

## 2015-10-17 NOTE — Telephone Encounter (Signed)
I would let them know for completeness.

## 2015-10-17 NOTE — Telephone Encounter (Signed)
Scheduled for surgery next week. Patient said she forgot to report that she is taking Glucosamine.  Should I let Ambia know as well? Significant?

## 2015-10-17 NOTE — Telephone Encounter (Signed)
I spoke with patient. Informed her of potassium result being low and need for KDur 20 mEq 3 x daily through surgery. i stressed importance of following up with her pcp to see how they want to manage this post operatively. Rx was sent to pharmacy.

## 2015-10-18 DIAGNOSIS — N8501 Benign endometrial hyperplasia: Secondary | ICD-10-CM

## 2015-10-18 HISTORY — DX: Benign endometrial hyperplasia: N85.01

## 2015-10-22 MED ORDER — DEXTROSE 5 % IV SOLN
2.0000 g | INTRAVENOUS | Status: AC
  Administered 2015-10-23: 2 g via INTRAVENOUS
  Filled 2015-10-22: qty 2

## 2015-10-23 ENCOUNTER — Ambulatory Visit (HOSPITAL_COMMUNITY)
Admission: RE | Admit: 2015-10-23 | Discharge: 2015-10-23 | Disposition: A | Discharge status: Home / Self Care | Payer: Medicare Other | Source: Ambulatory Visit | Attending: Gynecology | Admitting: Gynecology

## 2015-10-23 ENCOUNTER — Ambulatory Visit (HOSPITAL_COMMUNITY): Payer: Medicare Other | Admitting: Anesthesiology

## 2015-10-23 ENCOUNTER — Encounter (HOSPITAL_COMMUNITY)
Admission: RE | Disposition: A | Discharge status: Home / Self Care | Payer: Self-pay | Source: Ambulatory Visit | Attending: Gynecology

## 2015-10-23 ENCOUNTER — Encounter (HOSPITAL_COMMUNITY): Payer: Self-pay

## 2015-10-23 DIAGNOSIS — M1990 Unspecified osteoarthritis, unspecified site: Secondary | ICD-10-CM | POA: Diagnosis not present

## 2015-10-23 DIAGNOSIS — D250 Submucous leiomyoma of uterus: Secondary | ICD-10-CM | POA: Diagnosis not present

## 2015-10-23 DIAGNOSIS — N840 Polyp of corpus uteri: Secondary | ICD-10-CM | POA: Insufficient documentation

## 2015-10-23 DIAGNOSIS — D252 Subserosal leiomyoma of uterus: Secondary | ICD-10-CM | POA: Diagnosis not present

## 2015-10-23 DIAGNOSIS — N8501 Benign endometrial hyperplasia: Secondary | ICD-10-CM | POA: Diagnosis not present

## 2015-10-23 DIAGNOSIS — K219 Gastro-esophageal reflux disease without esophagitis: Secondary | ICD-10-CM | POA: Insufficient documentation

## 2015-10-23 DIAGNOSIS — I10 Essential (primary) hypertension: Secondary | ICD-10-CM | POA: Insufficient documentation

## 2015-10-23 DIAGNOSIS — Z6841 Body Mass Index (BMI) 40.0 and over, adult: Secondary | ICD-10-CM | POA: Diagnosis not present

## 2015-10-23 DIAGNOSIS — N858 Other specified noninflammatory disorders of uterus: Secondary | ICD-10-CM | POA: Diagnosis not present

## 2015-10-23 HISTORY — PX: DILATATION & CURETTAGE/HYSTEROSCOPY WITH MYOSURE: SHX6511

## 2015-10-23 SURGERY — DILATATION & CURETTAGE/HYSTEROSCOPY WITH MYOSURE
Anesthesia: General | Site: Vagina

## 2015-10-23 MED ORDER — POTASSIUM CHLORIDE CRYS ER 20 MEQ PO TBCR: 20.0000 meq | EXTENDED_RELEASE_TABLET | Freq: Every day | ORAL | Status: DC

## 2015-10-23 MED ORDER — KETOROLAC TROMETHAMINE 30 MG/ML IJ SOLN
INTRAMUSCULAR | Status: DC | PRN
  Administered 2015-10-23: 30 mg via INTRAVENOUS

## 2015-10-23 MED ORDER — ONDANSETRON HCL 4 MG/2ML IJ SOLN
INTRAMUSCULAR | Status: DC | PRN
  Administered 2015-10-23: 4 mg via INTRAVENOUS

## 2015-10-23 MED ORDER — KETOROLAC TROMETHAMINE 30 MG/ML IJ SOLN
INTRAMUSCULAR | Status: AC
  Filled 2015-10-23: qty 1

## 2015-10-23 MED ORDER — ONDANSETRON HCL 4 MG/2ML IJ SOLN
INTRAMUSCULAR | Status: AC
  Filled 2015-10-23: qty 2

## 2015-10-23 MED ORDER — FENTANYL CITRATE (PF) 100 MCG/2ML IJ SOLN
INTRAMUSCULAR | Status: AC
  Filled 2015-10-23: qty 2

## 2015-10-23 MED ORDER — LACTATED RINGERS IV SOLN
INTRAVENOUS | Status: DC
  Administered 2015-10-23: 125 mL/h via INTRAVENOUS
  Administered 2015-10-23: 11:00:00 via INTRAVENOUS

## 2015-10-23 MED ORDER — LIDOCAINE HCL (CARDIAC) 20 MG/ML IV SOLN
INTRAVENOUS | Status: DC | PRN
  Administered 2015-10-23: 100 mg via INTRAVENOUS

## 2015-10-23 MED ORDER — MEPERIDINE HCL 25 MG/ML IJ SOLN: 6.2500 mg | INTRAMUSCULAR | Status: DC | PRN

## 2015-10-23 MED ORDER — KETOROLAC TROMETHAMINE 30 MG/ML IJ SOLN: 30.0000 mg | Freq: Once | INTRAMUSCULAR | Status: DC | PRN

## 2015-10-23 MED ORDER — LIDOCAINE HCL (CARDIAC) 20 MG/ML IV SOLN
INTRAVENOUS | Status: AC
  Filled 2015-10-23: qty 5

## 2015-10-23 MED ORDER — SODIUM CHLORIDE 0.9 % IR SOLN
Status: DC | PRN
  Administered 2015-10-23: 3000 mL

## 2015-10-23 MED ORDER — MIDAZOLAM HCL 5 MG/5ML IJ SOLN
INTRAMUSCULAR | Status: DC | PRN
  Administered 2015-10-23: 2 mg via INTRAVENOUS

## 2015-10-23 MED ORDER — LIDOCAINE HCL 1 % IJ SOLN
INTRAMUSCULAR | Status: DC | PRN
  Administered 2015-10-23: 10 mL

## 2015-10-23 MED ORDER — MIDAZOLAM HCL 2 MG/2ML IJ SOLN
INTRAMUSCULAR | Status: AC
  Filled 2015-10-23: qty 2

## 2015-10-23 MED ORDER — SCOPOLAMINE 1 MG/3DAYS TD PT72
MEDICATED_PATCH | TRANSDERMAL | Status: AC
  Administered 2015-10-23: 1.5 mg via TRANSDERMAL
  Filled 2015-10-23: qty 1

## 2015-10-23 MED ORDER — DEXAMETHASONE SODIUM PHOSPHATE 4 MG/ML IJ SOLN
INTRAMUSCULAR | Status: DC | PRN
  Administered 2015-10-23: 10 mg via INTRAVENOUS

## 2015-10-23 MED ORDER — DEXAMETHASONE SODIUM PHOSPHATE 10 MG/ML IJ SOLN
INTRAMUSCULAR | Status: AC
Start: 2015-10-23 — End: 2015-10-23
  Filled 2015-10-23: qty 1

## 2015-10-23 MED ORDER — SCOPOLAMINE 1 MG/3DAYS TD PT72
1.0000 | MEDICATED_PATCH | Freq: Once | TRANSDERMAL | Status: DC
  Administered 2015-10-23: 1.5 mg via TRANSDERMAL

## 2015-10-23 MED ORDER — ONDANSETRON HCL 4 MG/2ML IJ SOLN: 4.0000 mg | Freq: Once | INTRAMUSCULAR | Status: DC | PRN

## 2015-10-23 MED ORDER — LIDOCAINE HCL 1 % IJ SOLN
INTRAMUSCULAR | Status: AC
  Filled 2015-10-23: qty 20

## 2015-10-23 MED ORDER — FENTANYL CITRATE (PF) 100 MCG/2ML IJ SOLN: 25.0000 ug | INTRAMUSCULAR | Status: DC | PRN

## 2015-10-23 MED ORDER — OXYCODONE-ACETAMINOPHEN 5-325 MG PO TABS: 1.0000 | ORAL_TABLET | ORAL | Status: DC | PRN

## 2015-10-23 MED ORDER — PROPOFOL 10 MG/ML IV BOLUS
INTRAVENOUS | Status: AC
  Filled 2015-10-23: qty 20

## 2015-10-23 MED ORDER — FENTANYL CITRATE (PF) 100 MCG/2ML IJ SOLN
INTRAMUSCULAR | Status: DC | PRN
  Administered 2015-10-23: 100 ug via INTRAVENOUS
  Administered 2015-10-23 (×2): 50 ug via INTRAVENOUS

## 2015-10-23 MED ORDER — PROPOFOL 10 MG/ML IV BOLUS
INTRAVENOUS | Status: DC | PRN
  Administered 2015-10-23: 200 mg via INTRAVENOUS

## 2015-10-23 MED ORDER — HYDROCODONE-ACETAMINOPHEN 7.5-325 MG PO TABS: 1.0000 | ORAL_TABLET | Freq: Once | ORAL | Status: DC | PRN

## 2015-10-23 SURGICAL SUPPLY — 17 items
CATH ROBINSON RED A/P 16FR (CATHETERS) ×2 IMPLANT
CLOTH BEACON ORANGE TIMEOUT ST (SAFETY) ×2 IMPLANT
CONTAINER PREFILL 10% NBF 60ML (FORM) ×4 IMPLANT
DEVICE MYOSURE LITE (MISCELLANEOUS) IMPLANT
DEVICE MYOSURE REACH (MISCELLANEOUS) ×1 IMPLANT
FILTER ARTHROSCOPY CONVERTOR (FILTER) ×3 IMPLANT
GLOVE BIO SURGEON STRL SZ7.5 (GLOVE) ×4 IMPLANT
GLOVE BIOGEL PI IND STRL 7.0 (GLOVE) ×1 IMPLANT
GLOVE BIOGEL PI INDICATOR 7.0 (GLOVE) ×1
GOWN STRL REUS W/TWL LRG LVL3 (GOWN DISPOSABLE) ×4 IMPLANT
PACK VAGINAL MINOR WOMEN LF (CUSTOM PROCEDURE TRAY) ×2 IMPLANT
PAD OB MATERNITY 4.3X12.25 (PERSONAL CARE ITEMS) ×2 IMPLANT
SEAL ROD LENS SCOPE MYOSURE (ABLATOR) ×2 IMPLANT
TOWEL OR 17X24 6PK STRL BLUE (TOWEL DISPOSABLE) ×4 IMPLANT
TUBING AQUILEX INFLOW (TUBING) ×2 IMPLANT
TUBING AQUILEX OUTFLOW (TUBING) ×2 IMPLANT
WATER STERILE IRR 1000ML POUR (IV SOLUTION) ×2 IMPLANT

## 2015-10-23 NOTE — Anesthesia Procedure Notes (Signed)
Procedure Name: LMA Insertion Date/Time: 10/23/2015 10:47 AM Performed by: Riki Sheer Pre-anesthesia Checklist: Patient identified, Emergency Drugs available, Patient being monitored, Timeout performed and Suction available Patient Re-evaluated:Patient Re-evaluated prior to inductionOxygen Delivery Method: Circle system utilized Preoxygenation: Pre-oxygenation with 100% oxygen Intubation Type: IV induction Ventilation: Mask ventilation without difficulty LMA: LMA inserted LMA Size: 4.0 Number of attempts: 1 Placement Confirmation: positive ETCO2,  CO2 detector and breath sounds checked- equal and bilateral Tube secured with: Tape Dental Injury: Teeth and Oropharynx as per pre-operative assessment

## 2015-10-23 NOTE — Discharge Instructions (Signed)
° °  Postoperative Instructions Hysteroscopy D & C ° °Dr. Jamieson Hetland and the nursing staff have discussed postoperative instructions with you.  If you have any questions please ask them before you leave the hospital, or call Dr Braylin Xu’s office at [phone].   ° °We would like to emphasize the following instructions: ° ° °? Call the office to make your follow-up appointment as recommended by Dr Preesha Benjamin (usually 1-2 weeks). ° °? You were given a prescription, or one was ordered for you at the pharmacy you designated.  Get that prescription filled and take the medication according to instructions. ° °? You may eat a regular diet, but slowly until you start having bowel movements. ° °? Drink plenty of water daily. ° °? Nothing in the vagina (intercourse, douching, objects of any kind) for two weeks.  When reinitiating intercourse, if it is uncomfortable, stop and make an appointment with Dr Valoria Tamburri to be evaluated. ° °? No driving for one to two days until the effects of anesthesia has worn off.  No traveling out of town for several days. ° °? You may shower, but no baths for one week.  Walking up and down stairs is ok.  No heavy lifting, prolonged standing, repeated bending or any “working out” until your post op check. ° °? Rest frequently, listen to your body and do not push yourself and overdo it. ° °? Call if: ° °o Your pain medication does not seem strong enough. °o Worsening pain or abdominal bloating °o Persistent nausea or vomiting °o Difficulty with urination or bowel movements. °o Temperature of 101 degrees or higher. °o Heavy vaginal bleeding.  If your period is due, you may use tampons. °o You have any questions or concerns ° ° ° °

## 2015-10-23 NOTE — Anesthesia Postprocedure Evaluation (Signed)
Anesthesia Post Note  Patient: Kristen Shaffer  Procedure(s) Performed: Procedure(s) (LRB): DILATATION & CURETTAGE/HYSTEROSCOPY WITH MYOSURE (N/A)  Patient location during evaluation: PACU Anesthesia Type: General Level of consciousness: awake Pain management: pain level controlled Vital Signs Assessment: post-procedure vital signs reviewed and stable Respiratory status: spontaneous breathing Cardiovascular status: stable Postop Assessment: no signs of nausea or vomiting and adequate PO intake Anesthetic complications: no    Last Vitals:  Filed Vitals:   10/23/15 1230 10/23/15 1315  BP:  150/84  Pulse: 86 81  Temp:  36.7 C  Resp: 20 20    Last Pain: There were no vitals filed for this visit.               [place]

## 2015-10-23 NOTE — Op Note (Signed)
Kristen Shaffer 12-27-1955 [id number]   Post Operative Note   Date of surgery:  10/23/2015  Pre Op Dx:  History of complex hyperplasia, leiomyoma, thickened endometrial echo on ultrasound  Post Op Dx:  Submucous leiomyoma, endometrial polyp  Procedure:  Hysteroscopy with Myosure resection submucous myoma and endometrial polyp  Surgeon:  Donalynn Furlong P  Anesthesia:  General  EBL:  minimal  Distended media discrepancy:  123XX123 cc saline  Complications:  None  Specimen:  #1 endometrial curetting #2 endometrial polyp and submucous myoma fragments to pathology  Findings: EUA:  External BUS vagina with symmetrical vitiligo appearance to the vulva consistent with her history of lichen sclerosis. Cervix normal. Uterus difficult to palpate but no gross masses. Adnexa without gross masses   Hysteroscopy: Adequate noting right/left tubal ostia, fundus, anterior/posterior endometrial surfaces, lower uterine segment and endocervical canal visualized. Two classic submucous myomas left upper lateral endometrial cavity and lower left lateral endometrial cavity. Classic endometrial polyp right lateral upper endometrial cavity. All resected in their entirety to the level of the surrounding endometrium.  Procedure:  The patient was taken to the operating room, was placed in the low dorsal lithotomy position, underwent general anesthesia, received a perineal/vaginal preparation with Betadine solution per nursing personnel and the bladder was emptied with in and out Foley catheterization. The timeout was performed by the surgical team. An EUA was performed. The patient was draped in the usual fashion. The cervix was visualized with a speculum, anterior lip grasped with a single-tooth tenaculum and a paracervical block was placed using 10 cc's of 1% lidocaine. The cervix was gently dilated to admit the Myosure hysteroscope and hysteroscopy was performed with findings noted above. Using the Myosure Reach  resectoscopic wand the myomas and polyp were resected in their entirety to the level the surrounding endometrium. A gentle sharp curettage was performed. Both specimens were sent separately to pathology.  Repeat hysteroscopy showed an empty cavity with good distention and no evidence of perforation with good hemostasis. The instruments were removed and adequate hemostasis was visualized at the tenaculum site and external cervical os. The patient was given intraoperative Toradol, was awakened without difficulty and was taken to the recovery room in good condition having tolerated the procedure well.     Anastasio Auerbach MD, 11:29 AM 10/23/2015

## 2015-10-23 NOTE — H&P (Signed)
  The patient was examined.  I reviewed the proposed surgery and consent form with the patient.  The dictated history and physical is current and accurate and all questions were answered. The patient is ready to proceed with surgery and has a realistic understanding and expectation for the outcome.   Anastasio Auerbach MD, 10:35 AM 10/23/2015

## 2015-10-23 NOTE — Transfer of Care (Signed)
Immediate Anesthesia Transfer of Care Note  Patient: Kristen Shaffer  Procedure(s) Performed: Procedure(s): DILATATION & CURETTAGE/HYSTEROSCOPY WITH MYOSURE (N/A)  Patient Location: PACU  Anesthesia Type:General  Level of Consciousness: awake, alert  and oriented  Airway & Oxygen Therapy: Patient Spontanous Breathing and Patient connected to nasal cannula oxygen  Post-op Assessment: Report given to RN and Post -op Vital signs reviewed and stable  Post vital signs: Reviewed and stable  Last Vitals:  Filed Vitals:   10/23/15 0952  BP: 150/94  Pulse: 105  Temp: 36.7 C  Resp: 20    Complications: No apparent anesthesia complications

## 2015-10-23 NOTE — Anesthesia Preprocedure Evaluation (Signed)
Anesthesia Evaluation  Patient identified by MRN, date of birth, ID band Patient awake    Reviewed: Allergy & Precautions, H&P , NPO status , Patient's Chart, lab work & pertinent test results  Airway Mallampati: III  TM Distance: >3 FB Neck ROM: full    Dental no notable dental hx. (+) Teeth Intact   Pulmonary    Pulmonary exam normal        Cardiovascular hypertension, Pt. on medications Normal cardiovascular exam     Neuro/Psych    GI/Hepatic Neg liver ROS, GERD  ,  Endo/Other  Morbid obesity  Renal/GU negative Renal ROS     Musculoskeletal   Abdominal (+) + obese,   Peds  Hematology   Anesthesia Other Findings   Reproductive/Obstetrics negative OB ROS                             Anesthesia Physical Anesthesia Plan  ASA: III  Anesthesia Plan: General   Post-op Pain Management:    Induction: Intravenous  Airway Management Planned: LMA  Additional Equipment:   Intra-op Plan:   Post-operative Plan:   Informed Consent: I have reviewed the patients History and Physical, chart, labs and discussed the procedure including the risks, benefits and alternatives for the proposed anesthesia with the patient or authorized representative who has indicated his/her understanding and acceptance.     Plan Discussed with: CRNA and Surgeon  Anesthesia Plan Comments:         Anesthesia Quick Evaluation

## 2015-10-24 ENCOUNTER — Encounter (HOSPITAL_COMMUNITY): Payer: Self-pay | Admitting: Gynecology

## 2015-10-25 ENCOUNTER — Telehealth: Payer: Self-pay

## 2015-10-25 ENCOUNTER — Encounter: Payer: Self-pay | Admitting: Gynecology

## 2015-10-25 NOTE — Telephone Encounter (Signed)
Patient called in my voice mail and left a message. She did not actually have a question. She began talking about #1 15 pills sometimes costing the same as 30 pills. She called pharmacy and asked but they told her that she would need to bring Rx in to see.  #2 That she hoped Dr. Loetta Rough was not going to want her to take progesterone as it caused her a lot of weight gain last time she took it.   I called her back and left message. I told her that if she was speaking of the Oxycodone that Dr. Loetta Rough wrote for #15 that I doubted her would want to increase it to #30 just for cost issues because of the level of narcotic it is and he would not anticipate her needing more than 15 tabs for her post op course after the surgery that she had.  I told her that Dr. Loetta Rough did not mention what his plan for her was but that he would discuss it with her at post op visit and surely she can bring up those concerns with him at that time.

## 2015-11-06 ENCOUNTER — Ambulatory Visit (INDEPENDENT_AMBULATORY_CARE_PROVIDER_SITE_OTHER): Payer: Medicare Other | Admitting: Gynecology

## 2015-11-06 ENCOUNTER — Encounter: Payer: Self-pay | Admitting: Gynecology

## 2015-11-06 VITALS — BP 134/82

## 2015-11-06 DIAGNOSIS — Z9889 Other specified postprocedural states: Secondary | ICD-10-CM

## 2015-11-06 NOTE — Patient Instructions (Signed)
Office will call you in reference to follow up.

## 2015-11-06 NOTE — Progress Notes (Signed)
Kristen Shaffer [birth year] [id number]        59 y.o.  G2P0020 presents for her postoperative visit status post hysteroscopic resection of 2 submucous myomas and an endometrial polyp.  Pathology showed simple and complex endometrial hyperplasia within the polyp without atypia. The endometrial curetting showed atrophic endometrium.  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 Was treated with progesterone and had negative biopsies 2010 2011. She's been amenorrheic with elevated FSH. I attempted a surveillance biopsy which returned negative tissue and ultimately lead to the sonohysterogram showing the endometrial defects and her ultimate hysteroscopy D&C.  Past medical history,surgical history, problem list, medications, allergies, family history and social history were all reviewed and documented in the EPIC chart.  Directed ROS with pertinent positives and negatives documented in the history of present illness/assessment and plan.  Exam: Kim assistant Filed Vitals:   11/06/15 1629  BP: 134/82   General appearance:  Normal External BUS vagina with changes consistent with her history of lichen sclerosus. Cervix normal. Uterus difficult to palpate but no gross masses or tenderness.  Assessment/Plan:  59 y.o. G2P0020 with the above history. I reviewed her complex hyperplasia without atypia and the risk of progression to cancer. Recommended treatment with progesterone which she adamantly refuses having gained a lot of weight during her last trial of progesterone. I also reviewed possible Mirena IUD which she is very reluctant to consider this also. Lastly I discussed no treatment at this point really evaluating and sampling her down the line say 6 months and if persistence or certainly any evidence of atypia been more aggressive treatment. Lastly she was interested in pursuing a hysterectomy for definitive treatment.  I reviewed her risk factors to include a BMI of 47, prior open  myomectomy, nulliparous status. If she would be considering hysterectomy I would strongly recommend robotic approach to avoid hopefully a larger abdominal incision with its inherent wound complications and the ability to deal with significant adhesions from her prior surgery. I relayed all this to her and at this point she is not interested in pursuing referral for robotic surgery but prefers a wait and see with reevaluation in the future which we are going to plan in 6 months. This point we will tentatively plan on a sonohysterogram in 6 months and the importance of follow up and the precancerous potential nature of her condition emphasize to her.   Anastasio Auerbach MD, 5:07 PM 11/06/2015

## 2015-11-15 DIAGNOSIS — R338 Other retention of urine: Secondary | ICD-10-CM | POA: Diagnosis not present

## 2015-11-15 DIAGNOSIS — N3946 Mixed incontinence: Secondary | ICD-10-CM | POA: Diagnosis not present

## 2015-11-15 DIAGNOSIS — R351 Nocturia: Secondary | ICD-10-CM | POA: Diagnosis not present

## 2015-12-20 ENCOUNTER — Telehealth: Payer: Self-pay

## 2015-12-20 ENCOUNTER — Other Ambulatory Visit: Payer: Self-pay | Admitting: Gynecology

## 2015-12-20 DIAGNOSIS — N8501 Benign endometrial hyperplasia: Secondary | ICD-10-CM

## 2015-12-20 NOTE — Telephone Encounter (Signed)
Left detailed message in voice mail with Dr. Dorette Grate responses to her questions. Asked her to call me if she would like to come for [facility] panel and I will place order and she will need a lab appointment.

## 2015-12-20 NOTE — Telephone Encounter (Signed)
Patient had questions:  #1 Prior to Hyst. D&C her potassium was low. You prescribed Potassium but now she is down to her last tablet and wonders does she need to continue. (My note when I called he about this in Nov indicates I stressed the importance of follow up with PCP to manage her potassium post operatively.)  #2 She said she joined the gym "on the 15th" but did not have energy to go and then had her D&C, Hyst. She said now they are billing her for a month and she hasn't even been. She wants you to write her a note to explain she was to fatigues to work out so that they will release her from her contract and resolve the billing issue.  #3 She said she did not want to take "synthetic progesterone" and you were going to check on this and get back with her.

## 2015-12-20 NOTE — Telephone Encounter (Signed)
Patient called back.  #1 She said she did see her PCP and he did check her blood work but did not advise her regarding potassium. She is going to call his office and follow up with those results.  #2 She said she only asked about this because she thought maybe her potassium being low made her tired feeling and you could excuse her.  #3  She said she was not aware of need for [facility] but will be happy to schedule. I placed order and sent Rosemarie Ax a note to call her and schedule her for June per Dr. Loetta Rough note.

## 2015-12-20 NOTE — Telephone Encounter (Signed)
#  1 she can come by here to check an electrolyte panel but she needs to follow up with her primary physician for long term management of her potassium levels. #2 I can give her an excuse for one month after her procedure as far as not exercising, but I cannot give her an excuse to be forgiven for the whole contract. Any further issues after that are unrelated to the procedure. #3 we discussed about taking progesterone which was my recommendation to protect her uterus from hyperplasia. As she has refused this then my recommendation would be to repeat the sonohysterogram in June to relook at the endometrial cavity and make sure that she is not developing significant hyperplasia or precancerous changes.

## 2016-02-25 ENCOUNTER — Other Ambulatory Visit: Payer: Self-pay | Admitting: Gynecology

## 2016-02-25 DIAGNOSIS — N8501 Benign endometrial hyperplasia: Secondary | ICD-10-CM

## 2016-04-24 ENCOUNTER — Ambulatory Visit: Payer: Medicare Other | Admitting: Gynecology

## 2016-04-24 ENCOUNTER — Other Ambulatory Visit: Payer: Medicare Other

## 2016-06-02 ENCOUNTER — Ambulatory Visit (HOSPITAL_COMMUNITY)
Admission: RE | Admit: 2016-06-02 | Discharge: 2016-06-02 | Disposition: A | Discharge status: Home / Self Care | Payer: Medicare HMO | Attending: Psychiatry | Admitting: Psychiatry

## 2016-06-02 DIAGNOSIS — I10 Essential (primary) hypertension: Secondary | ICD-10-CM | POA: Diagnosis not present

## 2016-06-02 DIAGNOSIS — M1990 Unspecified osteoarthritis, unspecified site: Secondary | ICD-10-CM | POA: Insufficient documentation

## 2016-06-02 DIAGNOSIS — K219 Gastro-esophageal reflux disease without esophagitis: Secondary | ICD-10-CM | POA: Diagnosis not present

## 2016-06-02 DIAGNOSIS — F332 Major depressive disorder, recurrent severe without psychotic features: Secondary | ICD-10-CM | POA: Diagnosis not present

## 2016-06-02 DIAGNOSIS — Z9889 Other specified postprocedural states: Secondary | ICD-10-CM | POA: Insufficient documentation

## 2016-06-02 DIAGNOSIS — F419 Anxiety disorder, unspecified: Secondary | ICD-10-CM | POA: Insufficient documentation

## 2016-06-02 NOTE — BH Assessment (Signed)
Tele Assessment Note   Kristen Shaffer is an 60 y.o. female. Pt presents voluntarily to [facility] for assessment accompanied by pt's friend, Kristen Shaffer. Pt is pleasant and oriented x 4. She denies HI and denies SI. She denies [facility] and no delusions noted. She is insightful. Pt cries during assessment. Pt endorses fleeting, passive SI. She states that she sometimes asks herself, "Why am I here?" Pt has no hx of suicide attempts and no hx of self harm. She reports she grew up [employer] and is afraid she'd go to hell if she committed suicide. She has no hx of inpatient MH treatment. She endorses decreased bathing, worthlessness, tearfulness, isolating behavior and loss of interest in usual pleasures. Pt reports she sees Dr Laqueta Due, psychologist, for talk therapy. Pt has no access to weapons. She denies substance use. Pt reports she began having depressive symptoms for first time in early 2000's. She states that for the past year she hasn't had depressive symptoms. She say prior to the last year, she had experienced severe depression for approx. 3 year-episode. She reports she began noticing depressive symptoms approx. 3 weeks ago. 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 She reports current stressors of financial and physical issues. Pt says it is a vicious cycle: she has physical pain and then accrues a lot of medical bills as she goes to PT, MD appts, etc. She then runs up credit cards to pay MD bills which results in her finances being a mess. 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 Pt reports her depressive symptoms don't feel like the "severe sadness" she felt at the worst of her last bout with depression. However, she does reports concern that her symptoms will get as bad as that 3 yr period. She states that when her physical pain has been most severe, her depressive  symptoms don't seem quite as severe. She says that when she begins feeling somewhat better physically, her depressive symptoms become more apparent. Pt reports hyperplasia which is caused by too much estrogen. Pt reports her twin sister was dx with bipolar d/o at age 43. Pt denies family hx of substance abuse or suicide attempts.  Kristen Spillers, her friend, reports she became concerned that pt wasn't answering her phone for two weeks. Kristen Spillers reports she finally called cops to break down pt's door today b/c pt was lying on floor inside her home. (Pt explains that she heard someone knocking on window and door but hoped the person would go away. She said she'd just gotten out of shower and was naked and was trying to hide behind couch, not realizing Kristen Spillers could see her arm through the window).  Diagnosis: Major Depressive Disorder, Recurrent, Severe without psychotic features Unspecified Anxiety Disorder  Past Medical History:  Past Medical History  Diagnosis Date  . Herniated disc   . Arthritis     Osteoarthritis  . Depression   . Fibroid   . Colon polyps   . GERD (gastroesophageal reflux disease)   . Vocal cord nodule   . Rheumatic fever     in childhood  . Asthma     Childhood  . Hypertension     no medications at this time  . Bilateral leg edema   . Sciatic leg pain   . Anemia     history  .  PONV (postoperative nausea and vomiting)   . Lichen sclerosus   . 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 Myomectomy  1999  . Dilation and curettage of uterus    . Hysteroscopy    . Tonsillectomy    . Colonoscopy    . Dilatation & curettage/hysteroscopy with myosure N/A 10/23/2015    Procedure: DILATATION & CURETTAGE/HYSTEROSCOPY WITH MYOSURE;  Surgeon: Anastasio Auerbach, MD;  Location: [facility];  Service: Gynecology;  Laterality: N/A;    Family History:  Family History  Problem Relation Age  of Onset  . Diabetes Mother   . Hypertension Mother   . Diabetes Father   . Heart disease Sister   . Hypertension Maternal Grandmother   . Diabetes Maternal Grandmother   . Cancer Sister     Colon  . Cancer Brother     Stomach    Social History:  reports that she has never smoked. She has never used smokeless tobacco. She reports that she drinks alcohol. She reports that she does not use illicit drugs.  Additional Social History:  Alcohol / Drug Use Pain Medications: pt denies abuse - see pta meds list Prescriptions: pt denies abuse - see pta meds list Over the Counter: pt denies abuse - see pta meds list History of alcohol / drug use?: No history of alcohol / drug abuse Longest period of sobriety (when/how long): n/a  CIWA:   COWS:    PATIENT STRENGTHS: (choose at least two) Ability for insight Average or above average intelligence Capable of independent living Communication skills General fund of knowledge Religious Affiliation Supportive family/friends Work skills  Allergies: No Known Allergies  Home Medications:  (Not in a hospital admission)  OB/GYN Status:  No LMP recorded. Patient is postmenopausal.  General Assessment Data Location of Assessment: [facility] Assessment Services TTS Assessment: In system Is this a Tele or Face-to-Face Assessment?: Face-to-Face Is this an Initial Assessment or a Re-assessment for this encounter?: Initial Assessment Marital status: Single Is patient pregnant?: No Pregnancy Status: No Living Arrangements: Alone Can pt return to current living arrangement?: Yes Admission Status: Voluntary Is patient capable of signing voluntary admission?: Yes Referral Source: Self/Family/Friend Insurance type: Sales promotion account executive Exam ([place]) Medical Exam completed: No Reason for MSE not completed: Patient Refused (pt signed MSe decline form)  Crisis Care Plan Living Arrangements: Alone Name of Psychiatrist:  none Name of Therapist: Dr Laqueta Due  Education Status Is patient currently in school?: No Highest grade of school patient has completed: 12  Risk to self with the past 6 months Suicidal Ideation: No (fleeting passive SI) Has patient been a risk to self within the past 6 months prior to admission? : No Suicidal Intent: No Has patient had any suicidal intent within the past 6 months prior to admission? : No Is patient at risk for suicide?: No Suicidal Plan?: No Has patient had any suicidal plan within the past 6 months prior to admission? : No Access to Means: No What has been your use of drugs/alcohol within the last 12 months?: none Previous Attempts/Gestures: No How many times?: 0 Other Self Harm Risks: none Triggers for Past Attempts: None known Intentional Self Injurious Behavior: None Family Suicide History: No (sister has bipolar disorder) Recent stressful life event(s): Other (Comment), Financial Problems (house AC broken, depressive symptoms, ) Persecutory voices/beliefs?: No Depression: Yes Depression Symptoms: Feeling worthless/self pity, Loss  of interest in usual pleasures, Fatigue, Isolating, Insomnia, Tearfulness Substance abuse history and/or treatment for substance abuse?: No Suicide prevention information given to non-admitted patients: Not applicable  Risk to Others within the past 6 months Homicidal Ideation: No Does patient have any lifetime risk of violence toward others beyond the six months prior to admission? : No Thoughts of Harm to Others: No Current Homicidal Intent: No Current Homicidal Plan: No Access to Homicidal Means: No Identified Victim: none History of harm to others?: No Assessment of Violence: None Noted Violent Behavior Description: pt denies hx violenc Does patient have access to weapons?: No Criminal Charges Pending?: No Does patient have a court date: No Is patient on probation?: No  Psychosis Hallucinations: None  noted Delusions: None noted  Mental Status Report Appearance/Hygiene: Unremarkable (walks with quad cane) Eye Contact: Good Motor Activity: Freedom of movement Speech: Logical/coherent (talkative) Level of Consciousness: Alert Mood: Depressed, Anxious, Sad, Apathetic Affect: Appropriate to circumstance, Depressed, Sad Anxiety Level: Moderate Thought Processes: Relevant, Coherent Judgement: Unimpaired Orientation: Person, Place, Time, Situation Obsessive Compulsive Thoughts/Behaviors: None  Cognitive Functioning Concentration: Normal Memory: Recent Intact, Remote Intact IQ: Average Insight: Good Impulse Control: Fair Appetite: Poor Sleep: Decreased Total Hours of Sleep: 5 Vegetative Symptoms:  (decreased bathing)  ADLScreening [facility] Assessment Services) Patient's cognitive ability adequate to safely complete daily activities?: Yes Patient able to express need for assistance with ADLs?: Yes Independently performs ADLs?: Yes (appropriate for developmental age)  Prior Inpatient Therapy Prior Inpatient Therapy: No Prior Therapy Dates: na Prior Therapy Facilty/Provider(s): na Reason for Treatment: na  Prior Outpatient Therapy Prior Outpatient Therapy: Yes Prior Therapy Dates: currently Prior Therapy Facilty/Provider(s): Dr Marcello Moores, psychologist Reason for Treatment: talk therapy Does patient have an ACCT team?: No Does patient have Intensive In-House Services?  : No Does patient have [facility] services? : No Does patient have P4CC services?: No  ADL Screening (condition at time of admission) Patient's cognitive ability adequate to safely complete daily activities?: Yes Is the patient deaf or have difficulty hearing?: No Does the patient have difficulty seeing, even when wearing glasses/contacts?: No Does the patient have difficulty concentrating, remembering, or making decisions?: No Patient able to express need for assistance with ADLs?: Yes Does the patient have  difficulty dressing or bathing?: No Independently performs ADLs?: Yes (appropriate for developmental age) Does the patient have difficulty walking or climbing stairs?: No Weakness of Legs: None Weakness of Arms/Hands: None  Home Assistive Devices/Equipment Home Assistive Devices/Equipment: Cane (specify quad or straight) (quad cane)    Abuse/Neglect Assessment (Assessment to be complete while patient is alone) Physical Abuse: Denies Verbal Abuse: Denies Sexual Abuse: Denies Exploitation of patient/patient's resources: Denies Self-Neglect: Denies     Regulatory affairs officer (For Healthcare) Does patient have an advance directive?: No Would patient like information on creating an advanced directive?: No - patient declined information    Additional Information 1:1 In Past 12 Months?: No CIRT Risk: No Elopement Risk: No Does patient have medical clearance?: No     Disposition:  Disposition Initial Assessment Completed for this Encounter: Yes Disposition of Patient: Outpatient treatment Type of outpatient treatment: Psych Intensive Outpatient (laura davis np rec [place])  Kristen Shaffer P 06/02/2016 6:59 PM

## 2017-04-01 ENCOUNTER — Encounter: Payer: Self-pay | Admitting: Gynecology

## 2018-11-17 DIAGNOSIS — I5189 Other ill-defined heart diseases: Secondary | ICD-10-CM

## 2018-11-17 HISTORY — DX: Other ill-defined heart diseases: I51.89

## 2019-05-18 DIAGNOSIS — Z8616 Personal history of COVID-19: Secondary | ICD-10-CM

## 2019-05-18 HISTORY — DX: Personal history of COVID-19: Z86.16

## 2019-06-08 ENCOUNTER — Inpatient Hospital Stay (HOSPITAL_COMMUNITY)
Admission: EM | Admit: 2019-06-08 | Discharge: 2019-06-16 | DRG: 177 | Disposition: A | Discharge status: Skilled Nursing Facility | Payer: Medicare HMO | Attending: Internal Medicine | Admitting: Internal Medicine

## 2019-06-08 ENCOUNTER — Encounter (HOSPITAL_COMMUNITY): Payer: Self-pay | Admitting: Emergency Medicine

## 2019-06-08 ENCOUNTER — Other Ambulatory Visit: Payer: Self-pay

## 2019-06-08 ENCOUNTER — Observation Stay (HOSPITAL_COMMUNITY): Payer: Medicare HMO

## 2019-06-08 ENCOUNTER — Emergency Department (HOSPITAL_COMMUNITY): Payer: Medicare HMO

## 2019-06-08 DIAGNOSIS — I10 Essential (primary) hypertension: Secondary | ICD-10-CM | POA: Diagnosis not present

## 2019-06-08 DIAGNOSIS — Z791 Long term (current) use of non-steroidal anti-inflammatories (NSAID): Secondary | ICD-10-CM

## 2019-06-08 DIAGNOSIS — Z833 Family history of diabetes mellitus: Secondary | ICD-10-CM

## 2019-06-08 DIAGNOSIS — N3000 Acute cystitis without hematuria: Secondary | ICD-10-CM | POA: Diagnosis not present

## 2019-06-08 DIAGNOSIS — I00 Rheumatic fever without heart involvement: Secondary | ICD-10-CM | POA: Diagnosis present

## 2019-06-08 DIAGNOSIS — Z8249 Family history of ischemic heart disease and other diseases of the circulatory system: Secondary | ICD-10-CM

## 2019-06-08 DIAGNOSIS — K219 Gastro-esophageal reflux disease without esophagitis: Secondary | ICD-10-CM | POA: Diagnosis present

## 2019-06-08 DIAGNOSIS — N3281 Overactive bladder: Secondary | ICD-10-CM | POA: Diagnosis present

## 2019-06-08 DIAGNOSIS — U071 COVID-19: Secondary | ICD-10-CM | POA: Diagnosis not present

## 2019-06-08 DIAGNOSIS — Z6841 Body Mass Index (BMI) 40.0 and over, adult: Secondary | ICD-10-CM

## 2019-06-08 DIAGNOSIS — N289 Disorder of kidney and ureter, unspecified: Secondary | ICD-10-CM

## 2019-06-08 DIAGNOSIS — F329 Major depressive disorder, single episode, unspecified: Secondary | ICD-10-CM | POA: Diagnosis present

## 2019-06-08 DIAGNOSIS — J9601 Acute respiratory failure with hypoxia: Secondary | ICD-10-CM | POA: Diagnosis present

## 2019-06-08 DIAGNOSIS — M1990 Unspecified osteoarthritis, unspecified site: Secondary | ICD-10-CM | POA: Diagnosis present

## 2019-06-08 DIAGNOSIS — N179 Acute kidney failure, unspecified: Secondary | ICD-10-CM | POA: Diagnosis not present

## 2019-06-08 DIAGNOSIS — R531 Weakness: Secondary | ICD-10-CM | POA: Diagnosis not present

## 2019-06-08 DIAGNOSIS — N390 Urinary tract infection, site not specified: Secondary | ICD-10-CM | POA: Diagnosis present

## 2019-06-08 DIAGNOSIS — E860 Dehydration: Secondary | ICD-10-CM | POA: Diagnosis present

## 2019-06-08 DIAGNOSIS — M5430 Sciatica, unspecified side: Secondary | ICD-10-CM | POA: Diagnosis present

## 2019-06-08 DIAGNOSIS — R319 Hematuria, unspecified: Secondary | ICD-10-CM | POA: Diagnosis present

## 2019-06-08 DIAGNOSIS — Z79899 Other long term (current) drug therapy: Secondary | ICD-10-CM

## 2019-06-08 DIAGNOSIS — J45909 Unspecified asthma, uncomplicated: Secondary | ICD-10-CM | POA: Diagnosis present

## 2019-06-08 DIAGNOSIS — D649 Anemia, unspecified: Secondary | ICD-10-CM | POA: Diagnosis present

## 2019-06-08 LAB — URINALYSIS, ROUTINE W REFLEX MICROSCOPIC
Glucose, UA: NEGATIVE mg/dL
Ketones, ur: 15 mg/dL — AB
Nitrite: POSITIVE — AB
Protein, ur: 100 mg/dL — AB
Specific Gravity, Urine: 1.025 (ref 1.005–1.030)
pH: 6 (ref 5.0–8.0)

## 2019-06-08 LAB — BASIC METABOLIC PANEL
Anion gap: 11 (ref 5–15)
Anion gap: 14 (ref 5–15)
BUN: 20 mg/dL (ref 8–23)
BUN: 21 mg/dL (ref 8–23)
CO2: 24 mmol/L (ref 22–32)
CO2: 24 mmol/L (ref 22–32)
Calcium: 8.4 mg/dL — ABNORMAL LOW (ref 8.9–10.3)
Calcium: 8.9 mg/dL (ref 8.9–10.3)
Chloride: 101 mmol/L (ref 98–111)
Chloride: 105 mmol/L (ref 98–111)
Creatinine, Ser: 1.03 mg/dL — ABNORMAL HIGH (ref 0.44–1.00)
Creatinine, Ser: 1.22 mg/dL — ABNORMAL HIGH (ref 0.44–1.00)
GFR calc Af Amer: 54 mL/min — ABNORMAL LOW (ref >60)
GFR calc Af Amer: >60 mL/min (ref >60)
GFR calc non Af Amer: 47 mL/min — ABNORMAL LOW (ref >60)
GFR calc non Af Amer: 57 mL/min — ABNORMAL LOW (ref >60)
Glucose, Bld: 122 mg/dL — ABNORMAL HIGH (ref 70–99)
Glucose, Bld: 130 mg/dL — ABNORMAL HIGH (ref 70–99)
Potassium: 3.2 mmol/L — ABNORMAL LOW (ref 3.5–5.1)
Potassium: 3.5 mmol/L (ref 3.5–5.1)
Sodium: 139 mmol/L (ref 135–145)
Sodium: 140 mmol/L (ref 135–145)

## 2019-06-08 LAB — URINALYSIS, MICROSCOPIC (REFLEX): WBC, UA: >50 WBC/hpf (ref 0–5)

## 2019-06-08 LAB — HEPATIC FUNCTION PANEL
ALT: 34 U/L (ref 0–44)
AST: 34 U/L (ref 15–41)
Albumin: 2.4 g/dL — ABNORMAL LOW (ref 3.5–5.0)
Alkaline Phosphatase: 75 U/L (ref 38–126)
Bilirubin, Direct: 0.7 mg/dL — ABNORMAL HIGH (ref 0.0–0.2)
Indirect Bilirubin: 0.6 mg/dL (ref 0.3–0.9)
Total Bilirubin: 1.3 mg/dL — ABNORMAL HIGH (ref 0.3–1.2)
Total Protein: 6.6 g/dL (ref 6.5–8.1)

## 2019-06-08 LAB — CBC
HCT: 44.6 % (ref 36.0–46.0)
HCT: 48.7 % — ABNORMAL HIGH (ref 36.0–46.0)
Hemoglobin: 14.2 g/dL (ref 12.0–15.0)
Hemoglobin: 15.7 g/dL — ABNORMAL HIGH (ref 12.0–15.0)
MCH: 25.8 pg — ABNORMAL LOW (ref 26.0–34.0)
MCH: 26.2 pg (ref 26.0–34.0)
MCHC: 31.8 g/dL (ref 30.0–36.0)
MCHC: 32.2 g/dL (ref 30.0–36.0)
MCV: 81.1 fL (ref 80.0–100.0)
MCV: 81.3 fL (ref 80.0–100.0)
Platelets: 398 10*3/uL (ref 150–400)
Platelets: 405 10*3/uL — ABNORMAL HIGH (ref 150–400)
RBC: 5.5 MIL/uL — ABNORMAL HIGH (ref 3.87–5.11)
RBC: 5.99 MIL/uL — ABNORMAL HIGH (ref 3.87–5.11)
RDW: 15.8 % — ABNORMAL HIGH (ref 11.5–15.5)
RDW: 16.1 % — ABNORMAL HIGH (ref 11.5–15.5)
WBC: 10.3 10*3/uL (ref 4.0–10.5)
WBC: 8.9 10*3/uL (ref 4.0–10.5)
nRBC: 0 % (ref 0.0–0.2)
nRBC: 0 % (ref 0.0–0.2)

## 2019-06-08 LAB — LACTATE DEHYDROGENASE: LDH: 255 U/L — ABNORMAL HIGH (ref 98–192)

## 2019-06-08 LAB — SARS CORONAVIRUS 2 BY RT PCR (HOSPITAL ORDER, PERFORMED IN ~~LOC~~ HOSPITAL LAB): SARS Coronavirus 2: POSITIVE — AB

## 2019-06-08 LAB — C-REACTIVE PROTEIN: CRP: 13.7 mg/dL — ABNORMAL HIGH (ref <1.0)

## 2019-06-08 LAB — TYPE AND SCREEN
ABO/RH(D): O POS
Antibody Screen: NEGATIVE

## 2019-06-08 LAB — FERRITIN: Ferritin: 434 ng/mL — ABNORMAL HIGH (ref 11–307)

## 2019-06-08 LAB — BRAIN NATRIURETIC PEPTIDE: B Natriuretic Peptide: 33.7 pg/mL (ref 0.0–100.0)

## 2019-06-08 LAB — D-DIMER, QUANTITATIVE: D-Dimer, Quant: 0.52 ug/mL-FEU — ABNORMAL HIGH (ref 0.00–0.50)

## 2019-06-08 LAB — PROCALCITONIN: Procalcitonin: 0.3 ng/mL

## 2019-06-08 LAB — ABO/RH: ABO/RH(D): O POS

## 2019-06-08 MED ORDER — SODIUM CHLORIDE 0.9 % IV SOLN
200.0000 mg | Freq: Once | INTRAVENOUS | Status: AC
  Administered 2019-06-09: 200 mg via INTRAVENOUS
  Filled 2019-06-08: qty 40

## 2019-06-08 MED ORDER — METHYLPREDNISOLONE SODIUM SUCC 125 MG IJ SOLR
60.0000 mg | Freq: Once | INTRAMUSCULAR | Status: AC
  Administered 2019-06-08: 60 mg via INTRAVENOUS
  Filled 2019-06-08: qty 2

## 2019-06-08 MED ORDER — SODIUM CHLORIDE 0.9 % IV BOLUS
1000.0000 mL | Freq: Once | INTRAVENOUS | Status: AC
  Administered 2019-06-08: 1000 mL via INTRAVENOUS

## 2019-06-08 MED ORDER — HYDRALAZINE HCL 20 MG/ML IJ SOLN: 5.0000 mg | INTRAMUSCULAR | Status: DC | PRN

## 2019-06-08 MED ORDER — ZINC SULFATE 220 (50 ZN) MG PO CAPS
220.0000 mg | ORAL_CAPSULE | Freq: Every day | ORAL | Status: DC
  Administered 2019-06-08 – 2019-06-16 (×9): 220 mg via ORAL
  Filled 2019-06-08 (×9): qty 1

## 2019-06-08 MED ORDER — METHYLPREDNISOLONE SODIUM SUCC 125 MG IJ SOLR
60.0000 mg | Freq: Two times a day (BID) | INTRAMUSCULAR | Status: DC
  Administered 2019-06-08 – 2019-06-12 (×9): 60 mg via INTRAVENOUS
  Filled 2019-06-08 (×9): qty 2

## 2019-06-08 MED ORDER — ACETAMINOPHEN 650 MG RE SUPP: 650.0000 mg | Freq: Four times a day (QID) | RECTAL | Status: DC | PRN

## 2019-06-08 MED ORDER — POTASSIUM CHLORIDE CRYS ER 20 MEQ PO TBCR
40.0000 meq | EXTENDED_RELEASE_TABLET | Freq: Once | ORAL | Status: AC
  Administered 2019-06-08: 40 meq via ORAL
  Filled 2019-06-08: qty 2

## 2019-06-08 MED ORDER — ONDANSETRON HCL 4 MG/2ML IJ SOLN: 4.0000 mg | Freq: Four times a day (QID) | INTRAMUSCULAR | Status: DC | PRN

## 2019-06-08 MED ORDER — PANTOPRAZOLE SODIUM 40 MG PO TBEC
40.0000 mg | DELAYED_RELEASE_TABLET | Freq: Every day | ORAL | Status: DC
  Administered 2019-06-08 – 2019-06-16 (×9): 40 mg via ORAL
  Filled 2019-06-08 (×9): qty 1

## 2019-06-08 MED ORDER — SODIUM CHLORIDE 0.9% FLUSH: 10.0000 mL | INTRAVENOUS | Status: DC | PRN

## 2019-06-08 MED ORDER — TOCILIZUMAB 400 MG/20ML IV SOLN
800.0000 mg | Freq: Once | INTRAVENOUS | Status: AC
  Administered 2019-06-08: 800 mg via INTRAVENOUS
  Filled 2019-06-08: qty 40

## 2019-06-08 MED ORDER — ONDANSETRON HCL 4 MG PO TABS: 4.0000 mg | ORAL_TABLET | Freq: Four times a day (QID) | ORAL | Status: DC | PRN

## 2019-06-08 MED ORDER — SODIUM CHLORIDE 0.9 % IV SOLN
INTRAVENOUS | Status: DC
  Administered 2019-06-08: 100 mL/h via INTRAVENOUS

## 2019-06-08 MED ORDER — ENOXAPARIN SODIUM 40 MG/0.4ML ~~LOC~~ SOLN
40.0000 mg | Freq: Every day | SUBCUTANEOUS | Status: DC
  Administered 2019-06-08 – 2019-06-09 (×2): 40 mg via SUBCUTANEOUS
  Filled 2019-06-08 (×2): qty 0.4

## 2019-06-08 MED ORDER — VITAMIN C 500 MG PO TABS
500.0000 mg | ORAL_TABLET | Freq: Every day | ORAL | Status: DC
  Administered 2019-06-08 – 2019-06-16 (×9): 500 mg via ORAL
  Filled 2019-06-08 (×9): qty 1

## 2019-06-08 MED ORDER — SODIUM CHLORIDE 0.9 % IV SOLN
100.0000 mg | INTRAVENOUS | Status: AC
  Administered 2019-06-09 – 2019-06-12 (×4): 100 mg via INTRAVENOUS
  Filled 2019-06-08 (×5): qty 20

## 2019-06-08 MED ORDER — SODIUM CHLORIDE 0.9 % IV SOLN
1.0000 g | INTRAVENOUS | Status: AC
  Administered 2019-06-09 – 2019-06-11 (×3): 1 g via INTRAVENOUS
  Filled 2019-06-08: qty 10
  Filled 2019-06-08: qty 1
  Filled 2019-06-08 (×2): qty 10

## 2019-06-08 MED ORDER — ACETAMINOPHEN 325 MG PO TABS: 650.0000 mg | ORAL_TABLET | Freq: Four times a day (QID) | ORAL | Status: DC | PRN

## 2019-06-08 MED ORDER — SODIUM CHLORIDE 0.9% FLUSH: 3.0000 mL | Freq: Once | INTRAVENOUS | Status: DC

## 2019-06-08 MED ORDER — CEFTRIAXONE SODIUM 1 G IJ SOLR
1.0000 g | Freq: Once | INTRAMUSCULAR | Status: AC
  Administered 2019-06-08: 1 g via INTRAVENOUS
  Filled 2019-06-08: qty 10

## 2019-06-08 NOTE — ED Notes (Signed)
ED TO INPATIENT HANDOFF REPORT  ED Nurse Name and Phone #: Mauri Brooklyn Name/Age/Gender Kristen Shaffer 63 y.o. female Room/Bed: 035C/035C  Code Status Full code  Home/SNF/Other Dc home AO x 4   Triage Complete: Triage complete  Chief Complaint generalized weakness  Triage Note BIB EMS from home. Pt reports gen weakness since Sunday. Pt has no other complaints.   Pt has sickly appearance.    Allergies No Known Allergies  Level of Care/Admitting Diagnosis ED Disposition    ED Disposition Condition Comment   Admit  Hospital Area: [facility] [[id number]]  Level of Care: Telemetry Medical [104]  I expect the patient will be discharged within 24 hours: No (not a candidate for 5C-Observation unit)  Covid Evaluation: Asymptomatic Screening Protocol (No Symptoms)  Diagnosis: UTI (urinary tract infection) [218863]  Admitting Physician: NIU, XILIN [[id number]]  Attending Physician: NIU, XILIN [[id number]]  PT Class (Do Not Modify): Observation [104]  PT Acc Code (Do Not Modify): Observation [10022]       B Medical/Surgery History Past Medical History:  Diagnosis Date  . Anemia    history  . Arthritis    Osteoarthritis  . Asthma    Childhood  . Bilateral leg edema   . Colon polyps   . 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 Depression   . Fibroid   . GERD (gastroesophageal reflux disease)   . Herniated disc   . Hypertension    no medications at this time  . Lichen sclerosus   . PONV (postoperative nausea and vomiting)   . Rheumatic fever    in childhood  . Sciatic leg pain   . Vocal cord nodule    Past Surgical History:  Procedure Laterality Date  . COLONOSCOPY    . DILATATION & CURETTAGE/HYSTEROSCOPY WITH MYOSURE N/A 10/23/2015   Procedure: DILATATION & CURETTAGE/HYSTEROSCOPY WITH MYOSURE;  Surgeon: Timothy P Fontaine, MD;  Location: [facility];  Service: Gynecology;  Laterality: N/A;  . DILATION AND  CURETTAGE OF UTERUS    . HYSTEROSCOPY    . MYOMECTOMY  1999  . TONSILLECTOMY       A IV Location/Drains/Wounds Patient Lines/Drains/Airways Status   Active Line/Drains/Airways    Name:   Placement date:   Placement time:   Site:   Days:   Peripheral IV 06/08/19 Left;Upper Arm   06/08/19    0411    Arm   less than 1   Incision (Closed) 10/23/15 Vagina Other (Comment)   10/23/15    1052     13 24          Intake/Output Last 24 hours No intake or output data in the 24 hours ending 06/08/19 0434  Labs/Imaging Results for orders placed or performed during the hospital encounter of 06/08/19 (from the past 48 hour(s))  Urinalysis, Routine w reflex microscopic     Status: Abnormal   Collection Time: 06/08/19 12:26 AM  Result Value Ref Range   Color, Urine AMBER (A) YELLOW    Comment: BIOCHEMICALS MAY BE AFFECTED BY COLOR   APPearance CLOUDY (A) CLEAR   Specific Gravity, Urine 1.025 1.005 - 1.030   pH 6.0 5.0 - 8.0   Glucose, UA NEGATIVE NEGATIVE mg/dL   Hgb urine dipstick LARGE (A) NEGATIVE   Bilirubin Urine MODERATE (A) NEGATIVE   Ketones, ur 15 (A) NEGATIVE mg/dL   Protein, ur 100 (A) NEGATIVE mg/dL   Nitrite POSITIVE (A) NEGATIVE   Leukocytes,Ua LARGE (A)  NEGATIVE    Comment: Performed at [facility], [place] [address] [place], [place] [address]  Urinalysis, Microscopic (reflex)     Status: Abnormal   Collection Time: 06/08/19 12:26 AM  Result Value Ref Range   RBC / HPF 11-20 0 - 5 RBC/hpf   WBC, UA >50 0 - 5 WBC/hpf   Bacteria, UA MANY (A) NONE SEEN   Squamous Epithelial / LPF 6-10 0 - 5   Mucus PRESENT    Granular Casts, UA PRESENT     Comment: Performed at [facility], [place] [address], [place], [place] [address]  Basic metabolic panel     Status: Abnormal   Collection Time: 06/08/19 12:28 AM  Result Value Ref Range   Sodium 139 135 - 145 mmol/L   Potassium 3.5 3.5 - 5.1 mmol/L   Chloride 101 98 - 111 mmol/L   CO2 24 22 - 32 mmol/L   Glucose, Bld 130  (H) 70 - 99 mg/dL   BUN 20 8 - 23 mg/dL   Creatinine, Ser 1.22 (H) 0.44 - 1.00 mg/dL   Calcium 8.9 8.9 - 10.3 mg/dL   GFR calc non Af Amer 47 (L) >60 mL/min   GFR calc Af Amer 54 (L) >60 mL/min   Anion gap 14 5 - 15    Comment: Performed at [facility], [place] [address], [place], [place] [address]  CBC     Status: Abnormal   Collection Time: 06/08/19 12:28 AM  Result Value Ref Range   WBC 10.3 4.0 - 10.5 K/uL   RBC 5.99 (H) 3.87 - 5.11 MIL/uL   Hemoglobin 15.7 (H) 12.0 - 15.0 g/dL   HCT 48.7 (H) 36.0 - 46.0 %   MCV 81.3 80.0 - 100.0 fL   MCH 26.2 26.0 - 34.0 pg   MCHC 32.2 30.0 - 36.0 g/dL   RDW 16.1 (H) 11.5 - 15.5 %   Platelets 398 150 - 400 K/uL   nRBC 0.0 0.0 - 0.2 %    Comment: Performed at [facility], [place] [address] [place],  [address]   Dg Chest Port 1 View  Result Date: 06/08/2019 CLINICAL DATA:  Weakness EXAM: PORTABLE CHEST 1 VIEW COMPARISON:  07/26/2011 FINDINGS: The heart size and mediastinal contours are within normal limits. Both lungs are clear. The visualized skeletal structures are unremarkable. IMPRESSION: No active disease. Electronically Signed   By: Ulyses Jarred M.D.   On: 06/08/2019 04:00    Pending Labs Unresulted Labs (From admission, onward)    Start     Ordered   06/08/19 0358  Urine Culture  Add-on,   AD    Comments: Please get clean catch specimen ONLY (DO NOT obtain from urinal)    06/08/19 0357   06/08/19 0358  Culture, blood (Routine X 2) w Reflex to ID Panel  BLOOD CULTURE X 2,   R (with STAT occurrences)    Comments: Please obtain prior to antibiotic administration.    06/08/19 0357   06/08/19 0345  SARS Coronavirus 2 (CEPHEID - Performed in [place] hospital lab), [facility] Order  Once,   STAT    Question:  Rule Out  Answer:  Yes   06/08/19 0345   Signed and Held  HIV antibody (Routine Testing)  Once,   R     Signed and Held   Signed and Held  Basic metabolic panel  Tomorrow morning,   R     Signed and Held  Signed  and Held  CBC  Tomorrow morning,   R     Signed and Held          Vitals/Pain Today's Vitals   06/08/19 0025 06/08/19 0203 06/08/19 0227 06/08/19 0345  BP:  (!) 135/123  (!) 149/88  Pulse:  (!) 109  (!) 111  Resp:  19  (!) 41  Temp:      TempSrc:   Oral   SpO2:  96%  95%  Weight: 136.1 kg     Height: 5\' 11"  (1.803 m)     PainSc:        Isolation Precautions No active isolations  Medications Medications  sodium chloride flush (NS) 0.9 % injection 3 mL (3 mLs Intravenous Not Given 06/08/19 0325)  cefTRIAXone (ROCEPHIN) 1 g in sodium chloride 0.9 % 100 mL IVPB (1 g Intravenous New Bag/Given 06/08/19 0422)  0.9 %  sodium chloride infusion (has no administration in time range)  sodium chloride 0.9 % bolus 1,000 mL (1,000 mLs Intravenous New Bag/Given 06/08/19 0420)    Mobility One assist Low fall risk   Focused Assessments Pt is AO x 4, ST on the monitor, pt with some SOB. Lungs clear.   R Recommendations: See Admitting Provider Note  Report given to:   Additional Notes:

## 2019-06-08 NOTE — Progress Notes (Signed)
Remdesivir - Pharmacy Consult  Desaturations and increase oxygen requirements noted CXR positive for LRTI  Plan: Remdesivir 200 mg IV x1 then 100 mg IV daily x4 days LFTs ordered Spoke with Nicki Reaper, PharmD at [facility] who is sending over 2 vials for this patient   Thank you for involving pharmacy in this patient's care.  Renold Genta, PharmD, BCPS Clinical Pharmacist Clinical phone for 06/08/2019 until 10:30p is [phone] 06/08/2019 10:20 PM  **Pharmacist phone directory can be found on [email or web address] listed under [place]**

## 2019-06-08 NOTE — Progress Notes (Addendum)
Patient's niece Threasa Beards updated on patient condition.  Patient has transfer orders to 2W.  This RN has tried 3 times to give receiving RN report without success.  This RN left call back number for receiving RN to call and get report whenever she is available.  Marcille Blanco, RN   [phone]: Patient report given to nurse Katie at [phone].  Patient being prepared for transfer. Marcille Blanco, RN

## 2019-06-08 NOTE — Progress Notes (Signed)
Pt oxygen saturation 90% 3L [place] while resting in bed.  Oxygen increased to 4L [place]  satting 93%.

## 2019-06-08 NOTE — H&P (Addendum)
History and Physical    Kristen Shaffer:[id number] DOB: 1956/05/26 DOA: 06/08/2019  Referring MD/NP/PA:   PCP: Nolene Ebbs, MD   Patient coming from:  The patient is coming from home.  At baseline, pt is independent for most of ADL.        Chief Complaint: Generalized weakness and an increased urinary frequency  HPI: Kristen Shaffer is a 63 y.o. female with medical history significant of hypertension, asthma, GERD, depression, rheumatic fever in the childhood, who presents with generalized weakness and increased urinary frequency.  Patient states that she has been having generalized weakness for more than 3 days, which is worsened progressively.  No unilateral numbness or tingling to extremities.  No facial droop or slurred speech.  Patient denies chest pain, shortness breath, cough, fever or chills.  She has nausea, but no vomiting, diarrhea or abdominal pain.  Patient states she has chronic increased urinary frequency, but no dysuria.  Patient states that she has acid reflux symptoms.  ED Course: pt was found to have positive urinalysis (cloudy appearance, large amount of leukocyte, positive nitrite, many bacteria, WBC> 50), AKI with creatinine 1.22, BUN 20, WBC 10.3, planning COVID-19 test, pending chest x-ray, temperature normal, blood pressure 135/123, tachycardia, tachypnea, oxygen saturation 96% on room air.  Patient is placed on telemetry bed for observation.   Review of Systems:   General: no fevers, chills, no body weight gain, has fatigue HEENT: no blurry vision, hearing changes or sore throat Respiratory: no dyspnea, coughing, wheezing CV: no chest pain, no palpitations GI: no nausea, vomiting, abdominal pain, diarrhea, constipation GU: no dysuria, burning on urination, has increased urinary frequency, no hematuria  Ext: no leg edema Neuro: no unilateral weakness, numbness, or tingling, no vision change or hearing loss Skin: no rash, no skin tear. MSK: No muscle spasm,  no deformity, no limitation of range of movement in spin Heme: No easy bruising.  Travel history: No recent long distant travel.  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   hysteroscopy D&C specimen   Depression    Fibroid    GERD (gastroesophageal reflux disease)    Herniated disc    Hypertension    no medications at this time   Lichen sclerosus    PONV (postoperative nausea and vomiting)    Rheumatic fever    in childhood   Sciatic leg pain    Vocal cord nodule     Past Surgical History:  Procedure Laterality Date   COLONOSCOPY     DILATATION & CURETTAGE/HYSTEROSCOPY WITH MYOSURE N/A 10/23/2015   Procedure: DILATATION & CURETTAGE/HYSTEROSCOPY WITH MYOSURE;  Surgeon: Anastasio Auerbach, MD;  Location: [facility];  Service: Gynecology;  Laterality: N/A;   DILATION AND CURETTAGE OF UTERUS     HYSTEROSCOPY     MYOMECTOMY  1999   TONSILLECTOMY      Social History:  reports that she has never smoked. She has never used smokeless tobacco. She reports current alcohol use. She reports that she does not use drugs.  Family History:  Family History  Problem Relation Age of Onset   Diabetes Mother    Hypertension Mother    Diabetes Father    Heart disease Sister    Hypertension Maternal Grandmother  Diabetes Maternal Grandmother    Cancer Sister        Colon   Cancer Brother        Stomach     Prior to Admission medications   Medication Sig Start Date End Date Taking? Authorizing Provider  clobetasol cream (TEMOVATE) 0.05 % Apply nightly to affected area x one mos. Then every few days and then stop. Then use prn. Patient taking differently: Apply 1 application topically at bedtime. Apply nightly to affected area x one mos. Then every few days and  then stop. Then use prn. 09/10/15   Fontaine, Belinda Block, MD  dexlansoprazole (DEXILANT) 60 MG capsule Take 60 mg by mouth daily.    [provider]  glucosamine-chondroitin 500-400 MG tablet Take 1 tablet by mouth daily.    [provider]  ibuprofen (ADVIL,MOTRIN) 200 MG tablet Take 600 mg by mouth daily as needed for moderate pain.    [provider]  oxyCODONE-acetaminophen (ROXICET) 5-325 MG tablet Take 1 tablet by mouth every 4 (four) hours as needed for severe pain. Patient not taking: Reported on 11/06/2015 10/23/15   Fontaine, Belinda Block, MD  potassium chloride SA (K-DUR,KLOR-CON) 20 MEQ tablet Take 1 tablet (20 mEq total) by mouth daily. 10/23/15   Fontaine, Belinda Block, MD  torsemide (DEMADEX) 20 MG tablet Take 20 mg by mouth daily.    [provider]    Physical Exam: Vitals:   06/08/19 0227 06/08/19 0345 06/08/19 0530 06/08/19 0608  BP:  (!) 149/88 127/71 (!) 145/88  Pulse:  (!) 111 99 (!) 102  Resp:  (!) 41  20  Temp:    98 F (36.7 C)  TempSrc: Oral   Oral  SpO2:  95% 96% 95%  Weight:    (!) 139.1 kg  Height:    5\' 10"  (1.778 m)   General: Not in acute distress HEENT:       Eyes: PERRL, EOMI, no scleral icterus.       ENT: No discharge from the ears and nose, no pharynx injection, no tonsillar enlargement.        Neck: No JVD, no bruit, no mass felt. Heme: No neck lymph node enlargement. Cardiac: S1/S2, RRR, No murmurs, No gallops or rubs. Respiratory: No rales, wheezing, rhonchi or rubs. GI: Soft, nondistended, nontender, no rebound pain, no organomegaly, BS present. GU: No hematuria Ext: No pitting leg edema bilaterally. 2+DP/PT pulse bilaterally. Musculoskeletal: No joint deformities, No joint redness or warmth, no limitation of ROM in spin. Skin: No rashes.  Neuro: Alert, oriented X3, cranial nerves II-XII grossly intact, moves all extremities normally.  Psych: Patient is not psychotic, no suicidal or hemocidal ideation.  Labs on  Admission: I have personally reviewed following labs and imaging studies  CBC: Recent Labs  Lab 06/08/19 0028  WBC 10.3  HGB 15.7*  HCT 48.7*  MCV 81.3  PLT 629   Basic Metabolic Panel: Recent Labs  Lab 06/08/19 0028  NA 139  K 3.5  CL 101  CO2 24  GLUCOSE 130*  BUN 20  CREATININE 1.22*  CALCIUM 8.9   GFR: Estimated Creatinine Clearance: 71.1 mL/min (A) (by C-G formula based on SCr of 1.22 mg/dL (H)). Liver Function Tests: No results for input(s): AST, ALT, ALKPHOS, BILITOT, PROT, ALBUMIN in the last 168 hours. No results for input(s): LIPASE, AMYLASE in the last 168 hours. No results for input(s): AMMONIA in the last 168 hours. Coagulation Profile: No results for input(s): INR, PROTIME in the last  168 hours. Cardiac Enzymes: No results for input(s): CKTOTAL, CKMB, CKMBINDEX, TROPONINI in the last 168 hours. BNP (last 3 results) No results for input(s): PROBNP in the last 8760 hours. HbA1C: No results for input(s): HGBA1C in the last 72 hours. CBG: No results for input(s): GLUCAP in the last 168 hours. Lipid Profile: No results for input(s): CHOL, HDL, LDLCALC, TRIG, CHOLHDL, LDLDIRECT in the last 72 hours. Thyroid Function Tests: No results for input(s): TSH, T4TOTAL, FREET4, T3FREE, THYROIDAB in the last 72 hours. Anemia Panel: No results for input(s): VITAMINB12, FOLATE, FERRITIN, TIBC, IRON, RETICCTPCT in the last 72 hours. Urine analysis:    Component Value Date/Time   COLORURINE AMBER (A) 06/08/2019 0026   APPEARANCEUR CLOUDY (A) 06/08/2019 0026   LABSPEC 1.025 06/08/2019 0026   PHURINE 6.0 06/08/2019 0026   GLUCOSEU NEGATIVE 06/08/2019 0026   HGBUR LARGE (A) 06/08/2019 0026   BILIRUBINUR MODERATE (A) 06/08/2019 0026   KETONESUR 15 (A) 06/08/2019 0026   PROTEINUR 100 (A) 06/08/2019 0026   UROBILINOGEN 0.2 11/23/2008 1126   NITRITE POSITIVE (A) 06/08/2019 0026   LEUKOCYTESUR LARGE (A) 06/08/2019 0026   Sepsis  Labs: @LABRCNTIP (procalcitonin:4,lacticidven:4) ) Recent Results (from the past 240 hour(s))  SARS Coronavirus 2 (CEPHEID - Performed in [facility] hospital lab), Hosp Order     Status: Abnormal   Collection Time: 06/08/19  4:15 AM   Specimen: Nasopharyngeal Swab  Result Value Ref Range Status   SARS Coronavirus 2 POSITIVE (A) NEGATIVE Final    Comment: RESULT CALLED TO, READ BACK BY AND VERIFIED WITH: [name] 0605 06/08/2019 T. TYSOR (NOTE) If result is NEGATIVE SARS-CoV-2 target nucleic acids are NOT DETECTED. The SARS-CoV-2 RNA is generally detectable in upper and lower  respiratory specimens during the acute phase of infection. The lowest  concentration of SARS-CoV-2 viral copies this assay can detect is 250  copies / mL. A negative result does not preclude SARS-CoV-2 infection  and should not be used as the sole basis for treatment or other  patient management decisions.  A negative result may occur with  improper specimen collection / handling, submission of specimen other  than nasopharyngeal swab, presence of viral mutation(s) within the  areas targeted by this assay, and inadequate number of viral copies  (<250 copies / mL). A negative result must be combined with clinical  observations, patient history, and epidemiological information. If result is POSITIVE SARS-CoV-2 target nucleic acids are DETECTED.  The SARS-CoV-2 RNA is generally detectable in upper and lower  respiratory specimens during the acute phase of infection.  Positive  results are indicative of active infection with SARS-CoV-2.  Clinical  correlation with patient history and other diagnostic information is  necessary to determine patient infection status.  Positive results do  not rule out bacterial infection or co-infection with other viruses. If result is PRESUMPTIVE POSTIVE SARS-CoV-2 nucleic acids MAY BE PRESENT.   A presumptive positive result was obtained on the submitted specimen  and  confirmed on repeat testing.  While 2019 novel coronavirus  (SARS-CoV-2) nucleic acids may be present in the submitted sample  additional confirmatory testing may be necessary for epidemiological  and / or clinical management purposes  to differentiate between  SARS-CoV-2 and other Sarbecovirus currently known to infect humans.  If clinically indicated additional testing with an alternate test  methodology [phone])  is advised. The SARS-CoV-2 RNA is generally  detectable in upper and lower respiratory specimens during the acute  phase of infection. The expected result is Negative. Fact  Sheet for Patients:  StrictlyIdeas.no Fact Sheet for Healthcare Providers: BankingDealers.co.za This test is not yet approved or cleared by the [place] FDA and has been authorized for detection and/or diagnosis of SARS-CoV-2 by FDA under an Emergency Use Authorization (EUA).  This EUA will remain in effect (meaning this test can be used) for the duration of the COVID-19 declaration under Section 564(b)(1) of the Act, 21 U.S.C. section 360bbb-3(b)(1), unless the authorization is terminated or revoked sooner. Performed at [facility], [place] [address], [place], [place] [address]      Radiological Exams on Admission: Dg Chest Port 1 View  Result Date: 06/08/2019 CLINICAL DATA:  Weakness EXAM: PORTABLE CHEST 1 VIEW COMPARISON:  07/26/2011 FINDINGS: The heart size and mediastinal contours are within normal limits. Both lungs are clear. The visualized skeletal structures are unremarkable. IMPRESSION: No active disease. Electronically Signed   By: Ulyses Jarred M.D.   On: 06/08/2019 04:00     EKG: Independently reviewed.  Sinus rhythm, QTC 505, right bundle blockade, early R wave progression  Assessment/Plan Principal Problem:   UTI (urinary tract infection) Active Problems:   Hypertension   GERD (gastroesophageal reflux disease)   AKI (acute  kidney injury) ([place])   COVID-19 virus infection   UTI (urinary tract infection): Patient has increased urinary frequency and a positive urinalysis, indicating UTI.  Patient does not have fever or leukocytosis.  Clinically not septic.  Currently hemodynamically stable.  -  Place on tele bed for obs -  Ceftriaxone by IV - Follow up results of urine and blood cx and amend antibiotic regimen if needed per sensitivity results - prn Zofran for nausea - IVF: 1L of NS bolus in ED, followed by 100cc/h -->will d/c IVF due to Covid 19 infetion  Hypertension: Bp 135/123. Not taking meds currently -IV hydralazine as needed  GERD (gastroesophageal reflux disease): -protonix  AKI (acute kidney injury) ([place]): Likely due to UTI and continuation use of ibuprofen -IV fluid as above Follow-up renal function by BMP  Addendum: Covid19 test came back positive.  Patient does not have respiratory symptoms.  Oxygen saturation 96% on room air.  Chest x-ray negative for infiltration.  will transfer to [facility] -vitamin C, zinc.   -d/c IVF -D-dimer, BNP,Trop, LFT, CRP, LDH, Procalcitonin, IL-6, ferritin, fibinogen, TG, Hep B SAg, HIV ab -Daily CRP, Ferritin, D-dimer, -if pt develops hypoxia, will ask the patient to maintain an awake prone position for 16+ hours a day, if possible, with a minimum of 2-3 hours at a time -Will attempt to maintain euvolemia to a net negative fluid status -Patient was seen wearing full PPE including: gown, gloves, head cover, N95 -IF patient deteriorates, will consult PCCM and ID  DVT ppx: SQ Lovenox Code Status: Full code Family Communication: None at bed side.   Disposition Plan:  Anticipate discharge back to previous home environment Consults called:  none Admission status: Obs / tele    Date of Service 06/08/2019    [facility]   If 7PM-7AM, please contact night-coverage [email or web address] Password [facility] 06/08/2019, 6:56 AM

## 2019-06-08 NOTE — ED Triage Notes (Signed)
Pt has sickly appearance.

## 2019-06-08 NOTE — ED Notes (Signed)
NieceThreasa Beards, [phone] for updates

## 2019-06-08 NOTE — ED Provider Notes (Signed)
[facility] EMERGENCY DEPARTMENT Provider Note   CSN: [id number] Arrival date & time: 06/08/19  0009    History   Chief Complaint Chief Complaint  Patient presents with  . Weakness    HPI Kristen Shaffer is a 63 y.o. female.   The history is provided by the patient.  She has history of hypertension, depression and comes in because of weakness for the last 3 days.  She states that she has not been able to get up off of her couch.  She denies fever, chills, sweats.  She has had anorexia but states she has been taking adequate fluids.  She denies chest pain, dyspnea.  There has been a slight cough which has been nonproductive.  She denies vomiting or diarrhea.  She has an overactive bladder but has not noted any changes in her urine.  Her daughter convinced her to come in to be evaluated.  Past Medical History:  Diagnosis Date  . Anemia    history  . Arthritis    Osteoarthritis  . Asthma    Childhood  . Bilateral leg edema   . Colon polyps   . 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 Depression   . Fibroid   . GERD (gastroesophageal reflux disease)   . Herniated disc   . Hypertension    no medications at this time  . Lichen sclerosus   . PONV (postoperative nausea and vomiting)   . Rheumatic fever    in childhood  . Sciatic leg pain   . Vocal cord nodule     Patient Active Problem List   Diagnosis Date Noted  . Arthritis   . Hypertension   . Depression   . Fibroid     Past Surgical History:  Procedure Laterality Date  . COLONOSCOPY    . DILATATION & CURETTAGE/HYSTEROSCOPY WITH MYOSURE N/A 10/23/2015   Procedure: DILATATION & CURETTAGE/HYSTEROSCOPY WITH MYOSURE;  Surgeon: Anastasio Auerbach, MD;  Location: [facility];  Service: Gynecology;  Laterality: N/A;  . DILATION AND CURETTAGE OF UTERUS    . HYSTEROSCOPY    . MYOMECTOMY  1999  . TONSILLECTOMY       OB History    Gravida  2   Para      Term      Preterm      AB  2   Living  0     SAB      TAB      Ectopic      Multiple      Live Births               Home Medications    Prior to Admission medications   Medication Sig Start Date End Date Taking? Authorizing Provider  clobetasol cream (TEMOVATE) 0.05 % Apply nightly to affected area x one mos. Then every few days and then stop. Then use prn. Patient taking differently: Apply 1 application topically at bedtime. Apply nightly to affected area x one mos. Then every few days and then stop. Then use prn. 09/10/15   Fontaine, Belinda Block, MD  dexlansoprazole (DEXILANT) 60 MG capsule Take 60 mg by mouth daily.    [provider]  glucosamine-chondroitin 500-400 MG tablet Take 1 tablet by mouth daily.    [provider]  ibuprofen (ADVIL,MOTRIN) 200 MG tablet Take 600 mg by mouth daily as needed for moderate pain.    [provider]  oxyCODONE-acetaminophen (ROXICET) 5-325  MG tablet Take 1 tablet by mouth every 4 (four) hours as needed for severe pain. Patient not taking: Reported on 11/06/2015 10/23/15   Fontaine, Belinda Block, MD  potassium chloride SA (K-DUR,KLOR-CON) 20 MEQ tablet Take 1 tablet (20 mEq total) by mouth daily. 10/23/15   Fontaine, Belinda Block, MD  torsemide (DEMADEX) 20 MG tablet Take 20 mg by mouth daily.    [provider]    Family History Family History  Problem Relation Age of Onset  . Diabetes Mother   . Hypertension Mother   . Diabetes Father   . Heart disease Sister   . Hypertension Maternal Grandmother   . Diabetes Maternal Grandmother   . Cancer Sister        Colon  . Cancer Brother        Stomach    Social History Social History   Tobacco Use  . Smoking status: Never Smoker  . Smokeless tobacco: Never Used  Substance Use Topics  . Alcohol use: Yes    Alcohol/week: 0.0 standard drinks    Comment: occ  . Drug use: No     Allergies   Patient has no known allergies.   Review of  Systems Review of Systems  All other systems reviewed and are negative.    Physical Exam Updated Vital Signs BP (!) 135/123 (BP Location: Right Arm)   Pulse (!) 109   Temp 98.2 F (36.8 C) (Oral)   Resp 19   Ht 5\' 11"  (1.803 m)   Wt 136.1 kg   SpO2 96%   BMI 41.84 kg/m   Physical Exam Vitals signs and nursing note reviewed.    Morbidly obese 63 year old female, dyspneic at rest, but in no acute distress. Vital signs are significant for elevated heart rate, respiratory rate, blood pressure. Oxygen saturation is 96%, which is normal. Head is normocephalic and atraumatic. PERRLA, EOMI. Oropharynx is clear. Neck is nontender and supple without adenopathy or JVD. Back is nontender and there is no CVA tenderness. Lungs are clear without rales, wheezes, or rhonchi. Chest is nontender. Heart has regular rate and rhythm without murmur. Abdomen is soft, flat, nontender without masses or hepatosplenomegaly and peristalsis is normoactive. Extremities have no cyanosis or edema, full range of motion is present. Skin is warm and dry without rash. Neurologic: Mental status is normal, cranial nerves are intact, there are no motor or sensory deficits.  ED Treatments / Results  Labs (all labs ordered are listed, but only abnormal results are displayed) Labs Reviewed  BASIC METABOLIC PANEL - Abnormal; Notable for the following components:      Result Value   Glucose, Bld 130 (*)    Creatinine, Ser 1.22 (*)    GFR calc non Af Amer 47 (*)    GFR calc Af Amer 54 (*)    All other components within normal limits  CBC - Abnormal; Notable for the following components:   RBC 5.99 (*)    Hemoglobin 15.7 (*)    HCT 48.7 (*)    RDW 16.1 (*)    All other components within normal limits  URINALYSIS, ROUTINE W REFLEX MICROSCOPIC - Abnormal; Notable for the following components:   Color, Urine AMBER (*)    APPearance CLOUDY (*)    Hgb urine dipstick LARGE (*)    Bilirubin Urine MODERATE (*)     Ketones, ur 15 (*)    Protein, ur 100 (*)    Nitrite POSITIVE (*)    Leukocytes,Ua LARGE (*)  All other components within normal limits  URINALYSIS, MICROSCOPIC (REFLEX) - Abnormal; Notable for the following components:   Bacteria, UA MANY (*)    All other components within normal limits    EKG EKG Interpretation  Date/Time:  Wednesday June 08 2019 00:15:48 EDT Ventricular Rate:  116 PR Interval:  122 QRS Duration: 120 QT Interval:  364 QTC Calculation: 505 R Axis:   36 Text Interpretation:  Sinus tachycardia with Premature atrial complexes Right bundle branch block Abnormal ECG No old tracing to compare Confirmed by Delora Fuel ([id number]) on 06/08/2019 2:15:18 AM   Radiology No results found.  Procedures Procedures  Medications Ordered in ED Medications  sodium chloride flush (NS) 0.9 % injection 3 mL (has no administration in time range)     Initial Impression / Assessment and Plan / ED Course  I have reviewed the triage vital signs and the nursing notes.  Pertinent labs & imaging results that were available during my care of the patient were reviewed by me and considered in my medical decision making (see chart for details).  Generalized weakness of uncertain cause.  Consider infectious, metabolic.  Labs show mild renal insufficiency.  Urinalysis is consistent with UTI and this is likely source of her problem.  Urine is sent for culture and she is started on ceftriaxone.  Because of severity of associated weakness, I feel that treatment should be started as an inpatient.  Case is discussed with Dr. Blaine Hamper of [facility], who agrees to admit the patient.  Final Clinical Impressions(s) / ED Diagnoses   Final diagnoses:  Urinary tract infection without hematuria, site unspecified  Renal insufficiency  Weakness    ED Discharge Orders    None       Delora Fuel, MD 63/84/66 [phone]

## 2019-06-08 NOTE — Progress Notes (Addendum)
Pt desatted on ra to 86%. Placed patient on 3L [place] and oxygen at 95%. Md notified of increase in oxygen demands and ordered CT of the chest.

## 2019-06-08 NOTE — Progress Notes (Addendum)
Patient seen and examined, admitted earlier this morning by Dr.Niu 63 year old female with morbid obesity, gait disorder, hypertension, asthma, GERD, depression, rheumatic fever presented to the ED overnight with weakness since 03-12-23. -Patient reports that at baseline has gait disorder and hence has some difficulty initiating ambulation and uses a walker to get around, this was more pronounced from Mar 12, 2023 onwards, presented to the emergency room, she was found to have abnormal urinalysis, positive nitrite, mild hematuria but no significant symptoms, surprisingly was found to be positive for COVID-19.  1.  Generalized weakness -Could be secondary to COVID-19 plus or minus UTI -Urinalysis grossly abnormal with positive leukocyte esterase nitrite, WBC and bacteria, only symptom is discoloration of urine -IV ceftriaxone, follow-up urine cultures  2.  COVID-19 -Chest x-ray overnight was unremarkable, no evidence of hypoxemia at this time as well -She does have evidence of sinus tachycardia and considerably elevated CRP, few bilateral rales, will start IV Solu-Medrol -Recheck CRP LDH and ferritin tomorrow -Check ambulatory O2 sats in a.m.  3.  Morbid obesity -BMI 43, contributing to gait disorder and osteoarthritis  4.  Mild AKI -Resolved with hydration  5. Gait disorder -attempt to ambulate, OOB to chair -will ask PT to eval  Domenic Polite, MD

## 2019-06-08 NOTE — ED Triage Notes (Signed)
BIB EMS from home. Pt reports gen weakness since Sunday. Pt has no other complaints.

## 2019-06-09 DIAGNOSIS — I10 Essential (primary) hypertension: Secondary | ICD-10-CM | POA: Diagnosis present

## 2019-06-09 DIAGNOSIS — Z833 Family history of diabetes mellitus: Secondary | ICD-10-CM | POA: Diagnosis not present

## 2019-06-09 DIAGNOSIS — N3001 Acute cystitis with hematuria: Secondary | ICD-10-CM | POA: Diagnosis not present

## 2019-06-09 DIAGNOSIS — U071 COVID-19: Secondary | ICD-10-CM | POA: Diagnosis present

## 2019-06-09 DIAGNOSIS — J9601 Acute respiratory failure with hypoxia: Secondary | ICD-10-CM | POA: Diagnosis present

## 2019-06-09 DIAGNOSIS — N3000 Acute cystitis without hematuria: Secondary | ICD-10-CM | POA: Diagnosis not present

## 2019-06-09 DIAGNOSIS — N3281 Overactive bladder: Secondary | ICD-10-CM | POA: Diagnosis present

## 2019-06-09 DIAGNOSIS — Z8249 Family history of ischemic heart disease and other diseases of the circulatory system: Secondary | ICD-10-CM | POA: Diagnosis not present

## 2019-06-09 DIAGNOSIS — M5432 Sciatica, left side: Secondary | ICD-10-CM | POA: Diagnosis not present

## 2019-06-09 DIAGNOSIS — E860 Dehydration: Secondary | ICD-10-CM | POA: Diagnosis present

## 2019-06-09 DIAGNOSIS — Z791 Long term (current) use of non-steroidal anti-inflammatories (NSAID): Secondary | ICD-10-CM | POA: Diagnosis not present

## 2019-06-09 DIAGNOSIS — J45909 Unspecified asthma, uncomplicated: Secondary | ICD-10-CM | POA: Diagnosis present

## 2019-06-09 DIAGNOSIS — R531 Weakness: Secondary | ICD-10-CM

## 2019-06-09 DIAGNOSIS — M1990 Unspecified osteoarthritis, unspecified site: Secondary | ICD-10-CM | POA: Diagnosis present

## 2019-06-09 DIAGNOSIS — Z79899 Other long term (current) drug therapy: Secondary | ICD-10-CM | POA: Diagnosis not present

## 2019-06-09 DIAGNOSIS — R319 Hematuria, unspecified: Secondary | ICD-10-CM | POA: Diagnosis present

## 2019-06-09 DIAGNOSIS — N390 Urinary tract infection, site not specified: Secondary | ICD-10-CM | POA: Diagnosis present

## 2019-06-09 DIAGNOSIS — F329 Major depressive disorder, single episode, unspecified: Secondary | ICD-10-CM | POA: Diagnosis present

## 2019-06-09 DIAGNOSIS — K219 Gastro-esophageal reflux disease without esophagitis: Secondary | ICD-10-CM | POA: Diagnosis present

## 2019-06-09 DIAGNOSIS — M5430 Sciatica, unspecified side: Secondary | ICD-10-CM | POA: Diagnosis present

## 2019-06-09 DIAGNOSIS — D649 Anemia, unspecified: Secondary | ICD-10-CM | POA: Diagnosis present

## 2019-06-09 DIAGNOSIS — Z6841 Body Mass Index (BMI) 40.0 and over, adult: Secondary | ICD-10-CM | POA: Diagnosis not present

## 2019-06-09 DIAGNOSIS — I00 Rheumatic fever without heart involvement: Secondary | ICD-10-CM | POA: Diagnosis present

## 2019-06-09 DIAGNOSIS — N179 Acute kidney failure, unspecified: Secondary | ICD-10-CM | POA: Diagnosis present

## 2019-06-09 LAB — HIV ANTIBODY (ROUTINE TESTING W REFLEX): HIV Screen 4th Generation wRfx: NONREACTIVE

## 2019-06-09 LAB — COMPREHENSIVE METABOLIC PANEL
ALT: 29 U/L (ref 0–44)
AST: 41 U/L (ref 15–41)
Albumin: 2.5 g/dL — ABNORMAL LOW (ref 3.5–5.0)
Alkaline Phosphatase: 71 U/L (ref 38–126)
Anion gap: 10 (ref 5–15)
BUN: 20 mg/dL (ref 8–23)
CO2: 23 mmol/L (ref 22–32)
Calcium: 8.4 mg/dL — ABNORMAL LOW (ref 8.9–10.3)
Chloride: 105 mmol/L (ref 98–111)
Creatinine, Ser: 0.95 mg/dL (ref 0.44–1.00)
GFR calc Af Amer: >60 mL/min (ref >60)
GFR calc non Af Amer: >60 mL/min (ref >60)
Glucose, Bld: 166 mg/dL — ABNORMAL HIGH (ref 70–99)
Potassium: 4.8 mmol/L (ref 3.5–5.1)
Sodium: 138 mmol/L (ref 135–145)
Total Bilirubin: 2 mg/dL — ABNORMAL HIGH (ref 0.3–1.2)
Total Protein: 6.5 g/dL (ref 6.5–8.1)

## 2019-06-09 LAB — CBC
HCT: 44 % (ref 36.0–46.0)
Hemoglobin: 13.8 g/dL (ref 12.0–15.0)
MCH: 25.4 pg — ABNORMAL LOW (ref 26.0–34.0)
MCHC: 31.4 g/dL (ref 30.0–36.0)
MCV: 80.9 fL (ref 80.0–100.0)
Platelets: 448 10*3/uL — ABNORMAL HIGH (ref 150–400)
RBC: 5.44 MIL/uL — ABNORMAL HIGH (ref 3.87–5.11)
RDW: 15.9 % — ABNORMAL HIGH (ref 11.5–15.5)
WBC: 7.5 10*3/uL (ref 4.0–10.5)
nRBC: 0 % (ref 0.0–0.2)

## 2019-06-09 LAB — HEPATITIS B SURFACE ANTIGEN: Hepatitis B Surface Ag: NEGATIVE

## 2019-06-09 LAB — C-REACTIVE PROTEIN: CRP: 9 mg/dL — ABNORMAL HIGH (ref <1.0)

## 2019-06-09 LAB — D-DIMER, QUANTITATIVE: D-Dimer, Quant: 0.63 ug/mL-FEU — ABNORMAL HIGH (ref 0.00–0.50)

## 2019-06-09 LAB — FERRITIN: Ferritin: 394 ng/mL — ABNORMAL HIGH (ref 11–307)

## 2019-06-09 MED ORDER — SODIUM CHLORIDE 0.9 % IV SOLN
INTRAVENOUS | Status: AC
  Administered 2019-06-09: 17:00:00 via INTRAVENOUS

## 2019-06-09 MED ORDER — ENOXAPARIN SODIUM 40 MG/0.4ML ~~LOC~~ SOLN
40.0000 mg | Freq: Two times a day (BID) | SUBCUTANEOUS | Status: DC
  Administered 2019-06-09 – 2019-06-13 (×8): 40 mg via SUBCUTANEOUS
  Filled 2019-06-09 (×8): qty 0.4

## 2019-06-09 NOTE — Progress Notes (Signed)
PROGRESS NOTE                                                                                                                                                                                                             Patient Demographics:    Kristen Shaffer, is a 63 y.o. female, DOB - 1956/10/23, TOI:[id number]  Admit date - 06/08/2019   Admitting Physician Ivor Costa, MD  Outpatient Primary MD for the patient is Nolene Ebbs, MD  LOS - 0  Outpatient Specialists: None  Chief Complaint  Patient presents with   Weakness       Brief Narrative 63 year old morbidly obese female with history of hypertension, asthma, depression, GERD, rheumatic fever in her childhood presented with generalized weakness and increased urinary frequency with mild hematuria for 3-4 days progressively getting worse.  She denied chest pain, shortness of breath cough, fevers or chills, nausea, vomiting, diarrhea or any sick contact. In the ED she was found to be tachycardic and tachypneic with O2 sat of 96% on room air, afebrile.  UA was suggestive of UTI with mild acute kidney injury and WBC of 10.3.  Patient placed on telemetry. COVID-19 was tested given her generalized weakness which was positive.  On day 2 she was noted to desaturate her oxygen in the 80s and required 4 L via nasal cannula.   Subjective:   Patient seen and examined.  Was sleeping when I entered the room.  Reported her breathing to be fine and denied any chest pain or discomfort.  Denied any dysuria or further hematuria.   Assessment  & Plan :    Principal Problem: Acute respiratory failure with hypoxia ([place]) COVID-19 infection. Secondary to COVID-19 infection.  Currently maintaining O2 sat in the mid 90s on 2.5 L via nasal cannula.  Not in any respiratory distress.  Remaining vitals are stable.  Patient started on IV Solu-Medrol 60 mg every 12 hours, remdesivir 200 mg x 1  followed by 100 mg every 24 hours and 1 dose of Actemra.  Does not require nebs at this time. Her CRP is better from admission (9 today), ferritin also mildly improved but d-dimer mildly elevated to 0.63.  Monitor daily for now.  Patient is at high risk of respiratory decompensation and will be transferred to [facility] for her COVID-19 infection.  UTI (urinary tract infection)   Empiric Rocephin.  Denies further hematuria.  Follow blood culture and urine culture.  Active Problems: Acute kidney injury ([place]) Mild likely secondary to dehydration and UTI.  Resolved with fluids.  Generalized weakness Associated with COVID-19 and UTI. Monitor with antibiotic and supportive care.  Morbid obesity (BMI 43.9 kg/m)  GERD Continue PPI   Patient status: Inpatient Patient presenting with generalized weakness with UTI and associated COVID-19 infection with hospital course prolonged due to acute hypoxic respiratory failure secondary to COVID-19 infection.  Patient is at high risk for clinical decompensation with further worsening of her respiratory failure and development of sepsis.  She will be transferred to [facility] for further management of her COVID-19 infection.  Patient needs to be monitored closely for at least another 48-72 hours for risk of clinical deterioration with supportive treatments including IV steroids and antiviral agent along with antibiotics for UTI.[name]  She will need to be monitored as inpatient for at least >2 midnights.  Code Status : Full code  Family Communication  : None  Disposition Plan  : Stable to be transferred to [facility]  Barriers For Discharge : Active symptoms  Consults  : None  Procedures  : None  DVT Prophylaxis  :  Lovenox -   Lab Results  Component Value Date   PLT 448 (H) 06/09/2019    Antibiotics  :    Anti-infectives (From admission, onward)   Start     Dose/Rate Route Frequency Ordered Stop   06/09/19  2200  remdesivir 100 mg in sodium chloride 0.9 % 250 mL IVPB     100 mg 500 mL/hr over 30 Minutes Intravenous Every 24 hours 06/08/19 2300 06/13/19 2159   06/09/19 0800  cefTRIAXone (ROCEPHIN) 1 g in sodium chloride 0.9 % 100 mL IVPB     1 g 200 mL/hr over 30 Minutes Intravenous Every 24 hours 06/08/19 0744     06/08/19 2300  remdesivir 200 mg in sodium chloride 0.9 % 250 mL IVPB     200 mg 500 mL/hr over 30 Minutes Intravenous Once 06/08/19 2300 06/09/19 0138   06/08/19 0400  cefTRIAXone (ROCEPHIN) 1 g in sodium chloride 0.9 % 100 mL IVPB     1 g 200 mL/hr over 30 Minutes Intravenous  Once 06/08/19 0345 06/08/19 0528        Objective:   Vitals:   06/08/19 1135 06/08/19 1400 06/08/19 1700 06/08/19 2239  BP: (!) 143/83   (!) 126/91  Pulse: (!) 111   99  Resp: 18   18  Temp: 98.7 F (37.1 C)   98 F (36.7 C)  TempSrc: Oral   Oral  SpO2: 97% 95% 92% 96%  Weight:      Height:        Wt Readings from Last 3 Encounters:  06/08/19 (!) 139.1 kg  10/16/15 (!) 141.2 kg  10/16/15 (!) 144.7 kg     Intake/Output Summary (Last 24 hours) at 06/09/2019 0803 Last data filed at 06/09/2019 0326 Gross per 24 hour  Intake 250 ml  Output --  Net 250 ml     Physical Exam  Gen: Morbidly obese female not in distress HEENT:  moist mucosa, supple neck Chest: clear b/l, no added sounds CVS: N S1&S2, no murmurs,  GI: soft, NT, ND, BS+ Musculoskeletal: warm, no edema     Data Review:    CBC Recent Labs  Lab 06/08/19 0028 06/08/19 1225 06/09/19 0429  WBC  10.3 8.9 7.5  HGB 15.7* 14.2 13.8  HCT 48.7* 44.6 44.0  PLT 398 405* 448*  MCV 81.3 81.1 80.9  MCH 26.2 25.8* 25.4*  MCHC 32.2 31.8 31.4  RDW 16.1* 15.8* 15.9*    Chemistries  Recent Labs  Lab 06/08/19 0028 06/08/19 1225 06/08/19 2302 06/09/19 0429  NA 139 140  --  138  K 3.5 3.2*  --  4.8  CL 101 105  --  105  CO2 24 24  --  23  GLUCOSE 130* 122*  --  166*  BUN 20 21  --  20  CREATININE 1.22* 1.03*  --  0.95   CALCIUM 8.9 8.4*  --  8.4*  AST  --   --  34 41  ALT  --   --  34 29  ALKPHOS  --   --  75 71  BILITOT  --   --  1.3* 2.0*   ------------------------------------------------------------------------------------------------------------------ No results for input(s): CHOL, HDL, LDLCALC, TRIG, CHOLHDL, LDLDIRECT in the last 72 hours.  No results found for: HGBA1C ------------------------------------------------------------------------------------------------------------------ No results for input(s): TSH, T4TOTAL, T3FREE, THYROIDAB in the last 72 hours.  Invalid input(s): FREET3 ------------------------------------------------------------------------------------------------------------------ Recent Labs    06/08/19 1225 06/09/19 0429  FERRITIN 434* 394*    Coagulation profile No results for input(s): INR, PROTIME in the last 168 hours.  Recent Labs    06/08/19 1225 06/09/19 0429  DDIMER 0.52* 0.63*    Cardiac Enzymes No results for input(s): CKMB, TROPONINI, MYOGLOBIN in the last 168 hours.  Invalid input(s): CK ------------------------------------------------------------------------------------------------------------------    Component Value Date/Time   BNP 33.7 06/08/2019 1227    Inpatient Medications  Scheduled Meds:  enoxaparin (LOVENOX) injection  40 mg Subcutaneous Daily   methylPREDNISolone (SOLU-MEDROL) injection  60 mg Intravenous Q12H   pantoprazole  40 mg Oral Q1200   sodium chloride flush  3 mL Intravenous Once   vitamin C  500 mg Oral Daily   zinc sulfate  220 mg Oral Daily   Continuous Infusions:  cefTRIAXone (ROCEPHIN)  IV     remdesivir 100 mg in NS 250 mL     PRN Meds:.acetaminophen **OR** acetaminophen, hydrALAZINE, ondansetron **OR** ondansetron (ZOFRAN) IV, sodium chloride flush  Micro Results Recent Results (from the past 240 hour(s))  SARS Coronavirus 2 (CEPHEID - Performed in [place] hospital lab), Hosp Order     Status:  Abnormal   Collection Time: 06/08/19  4:15 AM   Specimen: Nasopharyngeal Swab  Result Value Ref Range Status   SARS Coronavirus 2 POSITIVE (A) NEGATIVE Final    Comment: RESULT CALLED TO, READ BACK BY AND VERIFIED WITH: [name] 0605 06/08/2019 T. TYSOR (NOTE) If result is NEGATIVE SARS-CoV-2 target nucleic acids are NOT DETECTED. The SARS-CoV-2 RNA is generally detectable in upper and lower  respiratory specimens during the acute phase of infection. The lowest  concentration of SARS-CoV-2 viral copies this assay can detect is 250  copies / mL. A negative result does not preclude SARS-CoV-2 infection  and should not be used as the sole basis for treatment or other  patient management decisions.  A negative result may occur with  improper specimen collection / handling, submission of specimen other  than nasopharyngeal swab, presence of viral mutation(s) within the  areas targeted by this assay, and inadequate number of viral copies  (<250 copies / mL). A negative result must be combined with clinical  observations, patient history, and epidemiological information. If result is POSITIVE SARS-CoV-2 target nucleic acids  are DETECTED.  The SARS-CoV-2 RNA is generally detectable in upper and lower  respiratory specimens during the acute phase of infection.  Positive  results are indicative of active infection with SARS-CoV-2.  Clinical  correlation with patient history and other diagnostic information is  necessary to determine patient infection status.  Positive results do  not rule out bacterial infection or co-infection with other viruses. If result is PRESUMPTIVE POSTIVE SARS-CoV-2 nucleic acids MAY BE PRESENT.   A presumptive positive result was obtained on the submitted specimen  and confirmed on repeat testing.  While 2019 novel coronavirus  (SARS-CoV-2) nucleic acids may be present in the submitted sample  additional confirmatory testing may be necessary for epidemiological    and / or clinical management purposes  to differentiate between  SARS-CoV-2 and other Sarbecovirus currently known to infect humans.  If clinically indicated additional testing with an alternate test  methodology [id number])  is advised. The SARS-CoV-2 RNA is generally  detectable in upper and lower respiratory specimens during the acute  phase of infection. The expected result is Negative. Fact Sheet for Patients:  StrictlyIdeas.no Fact Sheet for Healthcare Providers: BankingDealers.co.za This test is not yet approved or cleared by the [place] FDA and has been authorized for detection and/or diagnosis of SARS-CoV-2 by FDA under an Emergency Use Authorization (EUA).  This EUA will remain in effect (meaning this test can be used) for the duration of the COVID-19 declaration under Section 564(b)(1) of the Act, 21 U.S.C. section 360bbb-3(b)(1), unless the authorization is terminated or revoked sooner. Performed at [facility], [place] [address] [place], [place] [address]     Radiology Reports Ct Chest Wo Contrast  Result Date: 06/08/2019 CLINICAL DATA:  COVID-19 positive, bowel worsening hypoxia, negative chest radiograph, history childhood asthma, hypertension, GERD EXAM: CT CHEST WITHOUT CONTRAST TECHNIQUE: Multidetector CT imaging of the chest was performed following the standard protocol without IV contrast. Sagittal and coronal MPR images reconstructed from axial data set. COMPARISON:  None. FINDINGS: Cardiovascular: Aorta upper normal caliber with mild atherosclerotic calcification. Heart normal size. No pericardial effusion. Mediastinum/Nodes: Scattered normal and upper normal sized mediastinal lymph nodes. No definite thoracic adenopathy. Esophagus unremarkable. Base of cervical region normal appearance. Lungs/Pleura: Peribronchial thickening. Scattered hazy pulmonary infiltrates consistent with pneumonia. Bibasilar atelectasis.  No pleural effusion, pneumothorax, or focal mass. Upper Abdomen: Multiple low-attenuation hepatic lesions likely cysts, largest 3.3 x 3.0 cm. Remaining visualized upper abdomen unremarkable. Musculoskeletal: No acute osseous findings. IMPRESSION: Scattered BILATERAL hazy pulmonary infiltrates consistent with pneumonia. Peribronchial thickening and scattered subsegmental atelectasis. Probable hepatic cysts up to 3.3 cm greatest size, recommend follow-up sonographic confirmation. Aortic Atherosclerosis (ICD10-I70.0). Electronically Signed   By: Lavonia Dana M.D.   On: 06/08/2019 18:27   Dg Chest Port 1 View  Result Date: 06/08/2019 CLINICAL DATA:  Weakness EXAM: PORTABLE CHEST 1 VIEW COMPARISON:  07/26/2011 FINDINGS: The heart size and mediastinal contours are within normal limits. Both lungs are clear. The visualized skeletal structures are unremarkable. IMPRESSION: No active disease. Electronically Signed   By: Ulyses Jarred M.D.   On: 06/08/2019 04:00    Time Spent in minutes  35   Korinna Tat M.D on 06/09/2019 at 8:03 AM  Between 7am to 7pm - Pager - [phone]  After 7pm go to [email or web address] - password [facility]  [facility] -  Office  [phone]

## 2019-06-09 NOTE — Evaluation (Signed)
Physical Therapy Evaluation Patient Details Name: Kristen Shaffer MRN: [id number] DOB: 05-17-1956 Today's Date: 06/09/2019   History of Present Illness  Pt adm with weakness, UTI, and Covid positive. Pt also developed hypoxia. PMH - obesity, depression, arthritis, back pain, htn  Clinical Impression  Pt presents to PT with poor functional activity tolerance and decr mobility. Pt lives alone and at this level in unable to care for herself. Recommend ST-SNF. Of note pt reports she has suffered from depression for a long time and reports little interest in activities or going outside of her home.    Follow Up Recommendations SNF;Supervision/Assistance - 24 hour    Equipment Recommendations  None recommended by PT    Recommendations for Other Services       Precautions / Restrictions Precautions Precautions: Fall Restrictions Weight Bearing Restrictions: No      Mobility  Bed Mobility Overal bed mobility: Needs Assistance Bed Mobility: Sidelying to Sit;Sit to Supine   Sidelying to sit: Supervision;HOB elevated   Sit to supine: Min assist   General bed mobility comments: Incr time and effort and use of rail to come to sitting. Assist to bring legs back up into bed to return to supine.  Transfers Overall transfer level: Needs assistance Equipment used: Rolling walker (2 wheeled) Transfers: Sit to/from Stand Sit to Stand: Min assist         General transfer comment: Assist to bring hips up. Pt uses rocking momentum to assist coming to stand.  Ambulation/Gait Ambulation/Gait assistance: Min assist Gait Distance (Feet): 12 Feet(x 2) Assistive device: Rolling walker (2 wheeled) Gait Pattern/deviations: Decreased step length - right;Decreased step length - left;Step-through pattern;Shuffle;Trunk flexed Gait velocity: decr Gait velocity interpretation: <1.31 ft/sec, indicative of household ambulator General Gait Details: Assist for balance and support. Pt tends to push  walker too far in front. Amb on 3L with O2 95%.   Stairs            Wheelchair Mobility    Modified Rankin (Stroke Patients Only)       Balance Overall balance assessment: Needs assistance Sitting-balance support: No upper extremity supported;Feet supported Sitting balance-Leahy Scale: Fair     Standing balance support: Single extremity supported;During functional activity Standing balance-Leahy Scale: Poor Standing balance comment: UE support and min guard for static standing                             Pertinent Vitals/Pain Pain Assessment: No/denies pain    Home Living Family/patient expects to be discharged to:: Private residence Living Arrangements: Alone Available Help at Discharge: Family;Available PRN/intermittently Type of Home: Apartment Home Access: Stairs to enter Entrance Stairs-Rails: None Entrance Stairs-Number of Steps: 3(3+1+1) Home Layout: One level Home Equipment: Walker - 4 wheels(bariatric)      Prior Function Level of Independence: Needs assistance   Gait / Transfers Assistance Needed: Modified independent household distances with rollator. Rollator won't fit in bathroom.  ADL's / Homemaking Assistance Needed: Daughter does grocery shopping   Comments: Pt reports she has been depressed and doesn't leave her apt. Keeps blinds drawn.      Hand Dominance        Extremity/Trunk Assessment   Upper Extremity Assessment Upper Extremity Assessment: Generalized weakness    Lower Extremity Assessment Lower Extremity Assessment: Generalized weakness       Communication   Communication: No difficulties  Cognition Arousal/Alertness: Awake/alert Behavior During Therapy: Flat affect Overall Cognitive Status: Within Functional Limits  for tasks assessed                                        General Comments      Exercises     Assessment/Plan    PT Assessment Patient needs continued PT services  PT  Problem List Decreased strength;Decreased activity tolerance;Decreased balance;Decreased mobility;Obesity       PT Treatment Interventions DME instruction;Gait training;Functional mobility training;Therapeutic activities;Therapeutic exercise;Balance training;Patient/family education    PT Goals (Current goals can be found in the Care Plan section)  Acute Rehab PT Goals Patient Stated Goal: not stated PT Goal Formulation: With patient Time For Goal Achievement: 06/23/19 Potential to Achieve Goals: Good    Frequency Min 2X/week   Barriers to discharge Inaccessible home environment;Decreased caregiver support Lives alone and stairs to enter apt    Co-evaluation               AM-PAC PT "6 Clicks" Mobility  Outcome Measure Help needed turning from your back to your side while in a flat bed without using bedrails?: None Help needed moving from lying on your back to sitting on the side of a flat bed without using bedrails?: None Help needed moving to and from a bed to a chair (including a wheelchair)?: A Little Help needed standing up from a chair using your arms (e.g., wheelchair or bedside chair)?: A Little Help needed to walk in hospital room?: A Little Help needed climbing 3-5 steps with a railing? : A Lot 6 Click Score: 19    End of Session Equipment Utilized During Treatment: Oxygen Activity Tolerance: Patient limited by fatigue Patient left: in bed;with call bell/phone within reach;with bed alarm set Nurse Communication: Mobility status PT Visit Diagnosis: Other abnormalities of gait and mobility (R26.89);Unsteadiness on feet (R26.81);Muscle weakness (generalized) (M62.81)    Time: 8416-6063 PT Time Calculation (min) (ACUTE ONLY): 28 min   Charges:   PT Evaluation $PT Eval Moderate Complexity: 1 Mod PT Treatments $Gait Training: 8-22 mins        [place] Pager [phone] Office [place] 06/09/2019, 2:39 PM

## 2019-06-10 DIAGNOSIS — R531 Weakness: Secondary | ICD-10-CM

## 2019-06-10 DIAGNOSIS — Z6841 Body Mass Index (BMI) 40.0 and over, adult: Secondary | ICD-10-CM

## 2019-06-10 LAB — FERRITIN: Ferritin: 219 ng/mL (ref 11–307)

## 2019-06-10 LAB — C-REACTIVE PROTEIN: CRP: 3.3 mg/dL — ABNORMAL HIGH (ref <1.0)

## 2019-06-10 NOTE — Progress Notes (Signed)
Dr Geryl Councilman paged and made aware that patient had arrived to 125.

## 2019-06-10 NOTE — Progress Notes (Signed)
PROGRESS NOTE  Kristen Shaffer:[id number] DOB: [birth year] DOA: 06/08/2019 PCP: Nolene Ebbs, MD  HPI/Recap of past 84 hours: 63 year old morbidly obese female with history of hypertension, asthma, depression, GERD, rheumatic fever in her childhood presented on 7/22 to the emergency room with generalized weakness and increased urinary frequency with mild hematuria for 3-4 days progressively getting worse.    No chest pain or shortness of breath.   In the ED she was found to be tachycardic and tachypneic with O2 sat of 96% on room air, afebrile.  UA was suggestive of UTI with mild acute kidney injury and WBC of 10.3.  Chest x-ray negative, but CT noted bilateral infiltrates consistent with pneumonia, especially seen in COVID. Patient placed on telemetry.  COVID-19 was tested given her generalized weakness which was positive.  On day 2 she was noted to desaturate her oxygen in the 80s and required 4 L via nasal cannula.  Given concerns for respiratory decompensation, patient transferred to [place] due to COVID infection on night of 7/23.  Patient doing well.  Feels like breathing might be a little bit better.  No pain.  Denies any dysuria.  Assessment/Plan: Principal Problem: COVID-19 infection causing acute respiratory failure with hypoxia ([place]): Stable, currently on 2 L nasal cannula.  Continue Solu-Medrol and day 2/5 of Remdisivir.  Has received 1 dose of Actemra.  Inflammatory markers from this morning are pending, but yesterday's labs no downward trend Active Problems:   Hypertension: Blood pressure stable   GERD (gastroesophageal reflux disease)   UTI (urinary tract infection): No urine culture drawn.  Polyuria has since ceased.  On empiric Rocephin   AKI (acute kidney injury) ([place]): Secondary to UTI.  Resolved with IV fluids.  Morbid obesity: Patient meets criteria with BMI greater than 40  Code Status: Full code  Family Communication: Updated daughter by phone   Disposition Plan: Likely discharge next week. PT recommending SNF.  Daughter agrees.  Pt would need negative test.  (Would need to be off O2 & completed Remdisivir)   Consultants:  None   Procedures:  None   Antimicrobials:  IV rocephin 7/22-7/24  DVT prophylaxis: Lovenox   Objective: Vitals:   06/10/19 0600 06/10/19 0700  BP:  101/66  Pulse: 67 68  Resp: (!) 23 (!) 24  Temp:  98 F (36.7 C)  SpO2: 100% 93%    Intake/Output Summary (Last 24 hours) at 06/10/2019 1142 Last data filed at 06/10/2019 0330 Gross per 24 hour  Intake 1040.72 ml  Output -  Net 1040.72 ml   Filed Weights   06/08/19 0025 06/08/19 0608  Weight: 136.1 kg (!) 139.1 kg   Body mass index is 43.99 kg/m.  Exam:   General: Alert and oriented x3, no acute distress  HEENT: Normocephalic and atraumatic, mucous membranes are moist  Neck: Thick, narrow airway  Cardiovascular: Regular rate and rhythm, S1-S2  Respiratory: Decreased breath sounds throughout secondary to body habitus, otherwise clear  Abdomen: Soft, nontender, nondistended, positive bowel sounds  Musculoskeletal: No clubbing or cyanosis, trace pitting edema  Skin: No skin breaks, tears or lesions  Neuro: No focal deficits  Psychiatry: Appropriate, no evidence of psychoses   Data Reviewed: CBC: Recent Labs  Lab 06/08/19 0028 06/08/19 1225 06/09/19 0429  WBC 10.3 8.9 7.5  HGB 15.7* 14.2 13.8  HCT 48.7* 44.6 44.0  MCV 81.3 81.1 80.9  PLT 398 405* 315*   Basic Metabolic Panel: Recent Labs  Lab 06/08/19 0028 06/08/19 1225 06/09/19  0429  NA 139 140 138  K 3.5 3.2* 4.8  CL 101 105 105  CO2 24 24 23   GLUCOSE 130* 122* 166*  BUN 20 21 20   CREATININE 1.22* 1.03* 0.95  CALCIUM 8.9 8.4* 8.4*   GFR: Estimated Creatinine Clearance: 91.3 mL/min (by C-G formula based on SCr of 0.95 mg/dL). Liver Function Tests: Recent Labs  Lab 06/08/19 2302 06/09/19 0429  AST 34 41  ALT 34 29  ALKPHOS 75 71  BILITOT 1.3*  2.0*  PROT 6.6 6.5  ALBUMIN 2.4* 2.5*   No results for input(s): LIPASE, AMYLASE in the last 168 hours. No results for input(s): AMMONIA in the last 168 hours. Coagulation Profile: No results for input(s): INR, PROTIME in the last 168 hours. Cardiac Enzymes: No results for input(s): CKTOTAL, CKMB, CKMBINDEX, TROPONINI in the last 168 hours. BNP (last 3 results) No results for input(s): PROBNP in the last 8760 hours. HbA1C: No results for input(s): HGBA1C in the last 72 hours. CBG: No results for input(s): GLUCAP in the last 168 hours. Lipid Profile: No results for input(s): CHOL, HDL, LDLCALC, TRIG, CHOLHDL, LDLDIRECT in the last 72 hours. Thyroid Function Tests: No results for input(s): TSH, T4TOTAL, FREET4, T3FREE, THYROIDAB in the last 72 hours. Anemia Panel: Recent Labs    06/08/19 1225 06/09/19 0429  FERRITIN 434* 394*   Urine analysis:    Component Value Date/Time   COLORURINE AMBER (A) 06/08/2019 0026   APPEARANCEUR CLOUDY (A) 06/08/2019 0026   LABSPEC 1.025 06/08/2019 0026   PHURINE 6.0 06/08/2019 0026   GLUCOSEU NEGATIVE 06/08/2019 0026   HGBUR LARGE (A) 06/08/2019 0026   BILIRUBINUR MODERATE (A) 06/08/2019 0026   KETONESUR 15 (A) 06/08/2019 0026   PROTEINUR 100 (A) 06/08/2019 0026   UROBILINOGEN 0.2 11/23/2008 1126   NITRITE POSITIVE (A) 06/08/2019 0026   LEUKOCYTESUR LARGE (A) 06/08/2019 0026   Sepsis Labs: @LABRCNTIP (procalcitonin:4,lacticidven:4)  ) Recent Results (from the past 240 hour(s))  SARS Coronavirus 2 (CEPHEID - Performed in [facility] hospital lab), Hosp Order     Status: Abnormal   Collection Time: 06/08/19  4:15 AM   Specimen: Nasopharyngeal Swab  Result Value Ref Range Status   SARS Coronavirus 2 POSITIVE (A) NEGATIVE Final    Comment: RESULT CALLED TO, READ BACK BY AND VERIFIED WITH: [name] 0605 06/08/2019 T. TYSOR (NOTE) If result is NEGATIVE SARS-CoV-2 target nucleic acids are NOT DETECTED. The SARS-CoV-2 RNA is generally  detectable in upper and lower  respiratory specimens during the acute phase of infection. The lowest  concentration of SARS-CoV-2 viral copies this assay can detect is 250  copies / mL. A negative result does not preclude SARS-CoV-2 infection  and should not be used as the sole basis for treatment or other  patient management decisions.  A negative result may occur with  improper specimen collection / handling, submission of specimen other  than nasopharyngeal swab, presence of viral mutation(s) within the  areas targeted by this assay, and inadequate number of viral copies  (<250 copies / mL). A negative result must be combined with clinical  observations, patient history, and epidemiological information. If result is POSITIVE SARS-CoV-2 target nucleic acids are DETECTED.  The SARS-CoV-2 RNA is generally detectable in upper and lower  respiratory specimens during the acute phase of infection.  Positive  results are indicative of active infection with SARS-CoV-2.  Clinical  correlation with patient history and other diagnostic information is  necessary to determine patient infection status.  Positive results do  not rule out bacterial infection or co-infection with other viruses. If result is PRESUMPTIVE POSTIVE SARS-CoV-2 nucleic acids MAY BE PRESENT.   A presumptive positive result was obtained on the submitted specimen  and confirmed on repeat testing.  While 2019 novel coronavirus  (SARS-CoV-2) nucleic acids may be present in the submitted sample  additional confirmatory testing may be necessary for epidemiological  and / or clinical management purposes  to differentiate between  SARS-CoV-2 and other Sarbecovirus currently known to infect humans.  If clinically indicated additional testing with an alternate test  methodology [phone])  is advised. The SARS-CoV-2 RNA is generally  detectable in upper and lower respiratory specimens during the acute  phase of infection. The  expected result is Negative. Fact Sheet for Patients:  StrictlyIdeas.no Fact Sheet for Healthcare Providers: BankingDealers.co.za This test is not yet approved or cleared by the [place] FDA and has been authorized for detection and/or diagnosis of SARS-CoV-2 by FDA under an Emergency Use Authorization (EUA).  This EUA will remain in effect (meaning this test can be used) for the duration of the COVID-19 declaration under Section 564(b)(1) of the Act, 21 U.S.C. section 360bbb-3(b)(1), unless the authorization is terminated or revoked sooner. Performed at [facility], [place] [address], [place], [place] [address]   Culture, blood (Routine X 2) w Reflex to ID Panel     Status: None (Preliminary result)   Collection Time: 06/08/19  4:20 AM   Specimen: BLOOD RIGHT ARM  Result Value Ref Range Status   Specimen Description BLOOD RIGHT ARM  Final   Special Requests   Final    BOTTLES DRAWN AEROBIC AND ANAEROBIC Blood Culture adequate volume   Culture   Final    NO GROWTH 2 DAYS Performed at [facility], [place] [address], [place], [place] [address]    Report Status PENDING  Incomplete  Culture, blood (Routine X 2) w Reflex to ID Panel     Status: None (Preliminary result)   Collection Time: 06/08/19  4:34 AM   Specimen: BLOOD LEFT ARM  Result Value Ref Range Status   Specimen Description BLOOD LEFT ARM  Final   Special Requests   Final    BOTTLES DRAWN AEROBIC ONLY Blood Culture adequate volume   Culture   Final    NO GROWTH 2 DAYS Performed at [facility], [address], [place], [place] [address]    Report Status PENDING  Incomplete      Studies: No results found.  Scheduled Meds: . enoxaparin (LOVENOX) injection  40 mg Subcutaneous Q12H  . methylPREDNISolone (SOLU-MEDROL) injection  60 mg Intravenous Q12H  . pantoprazole  40 mg Oral Q1200  . sodium chloride flush  3 mL Intravenous Once  . vitamin C  500 mg  Oral Daily  . zinc sulfate  220 mg Oral Daily    Continuous Infusions: . sodium chloride 100 mL/hr at 06/09/19 1711  . cefTRIAXone (ROCEPHIN)  IV 1 g (06/10/19 0919)  . remdesivir 100 mg in NS 250 mL 100 mg (06/09/19 2344)     LOS: 1 day     Annita Brod, MD [facility]  To reach me or the doctor on call, go to: [email or web address] Password [email or web address]  06/10/2019, 11:42 AM

## 2019-06-10 NOTE — Progress Notes (Signed)
Patient arrived to 125 via [facility].  O2 at 2L/min.  Right sigle lumen midline intact and flushes without any difficulties.  VSS,  Patient alert and oriented x 4.  Patient moved from stretcher to bed with assistance from staff.  Patient uses walker.  No c/o from patient and no distress noted.  CCMD notified of patient arrival and and information needed.

## 2019-06-11 LAB — COMPREHENSIVE METABOLIC PANEL
ALT: 27 U/L (ref 0–44)
AST: 24 U/L (ref 15–41)
Albumin: 2.7 g/dL — ABNORMAL LOW (ref 3.5–5.0)
Alkaline Phosphatase: 71 U/L (ref 38–126)
Anion gap: 9 (ref 5–15)
BUN: 23 mg/dL (ref 8–23)
CO2: 23 mmol/L (ref 22–32)
Calcium: 8.3 mg/dL — ABNORMAL LOW (ref 8.9–10.3)
Chloride: 107 mmol/L (ref 98–111)
Creatinine, Ser: 0.91 mg/dL (ref 0.44–1.00)
GFR calc Af Amer: >60 mL/min (ref >60)
GFR calc non Af Amer: >60 mL/min (ref >60)
Glucose, Bld: 146 mg/dL — ABNORMAL HIGH (ref 70–99)
Potassium: 3.8 mmol/L (ref 3.5–5.1)
Sodium: 139 mmol/L (ref 135–145)
Total Bilirubin: 0.8 mg/dL (ref 0.3–1.2)
Total Protein: 6.3 g/dL — ABNORMAL LOW (ref 6.5–8.1)

## 2019-06-11 LAB — D-DIMER, QUANTITATIVE: D-Dimer, Quant: 0.73 ug/mL-FEU — ABNORMAL HIGH (ref 0.00–0.50)

## 2019-06-11 LAB — CBC
HCT: 40.6 % (ref 36.0–46.0)
Hemoglobin: 12.7 g/dL (ref 12.0–15.0)
MCH: 25.8 pg — ABNORMAL LOW (ref 26.0–34.0)
MCHC: 31.3 g/dL (ref 30.0–36.0)
MCV: 82.4 fL (ref 80.0–100.0)
Platelets: 461 10*3/uL — ABNORMAL HIGH (ref 150–400)
RBC: 4.93 MIL/uL (ref 3.87–5.11)
RDW: 16.4 % — ABNORMAL HIGH (ref 11.5–15.5)
WBC: 10.9 10*3/uL — ABNORMAL HIGH (ref 4.0–10.5)
nRBC: 0 % (ref 0.0–0.2)

## 2019-06-11 LAB — FERRITIN: Ferritin: 181 ng/mL (ref 11–307)

## 2019-06-11 LAB — C-REACTIVE PROTEIN: CRP: 2 mg/dL — ABNORMAL HIGH (ref <1.0)

## 2019-06-11 NOTE — Progress Notes (Signed)
PROGRESS NOTE  Kristen Shaffer QVZ:[id number] DOB: 01-26-1956 DOA: 06/08/2019 PCP: Nolene Ebbs, MD  HPI/Recap of past 39 hours: 63 year old morbidly obese female with history of hypertension, asthma, depression, GERD, rheumatic fever in her childhood presented on 7/22 to the emergency room with generalized weakness and increased urinary frequency with mild hematuria for 3-4 days progressively getting worse.    No chest pain or shortness of breath.   In the ED she was found to be tachycardic and tachypneic with O2 sat of 96% on room air, afebrile.  UA was suggestive of UTI with mild acute kidney injury and WBC of 10.3.  Chest x-ray negative, but CT noted bilateral infiltrates consistent with pneumonia, especially seen in COVID. Patient placed on telemetry.  COVID-19 was tested given her generalized weakness which was positive.  On day 2 she was noted to desaturate her oxygen in the 80s and required 4 L via nasal cannula.  Given concerns for respiratory decompensation, patient transferred to [place] due to COVID infection on night of 7/23.  Patient continues to do well.  Breathing comfortably and feels like it is improving.  Currently down to 2 L nasal cannula.  Assessment/Plan: Principal Problem: COVID-19 infection causing acute respiratory failure with hypoxia ([place]): Stable, currently on 2 L nasal cannula.  Continue Solu-Medrol and day 3/5 of Remdisivir.  Has received 1 dose of Actemra.  Inflammatory markers from this morning continue to trend downward Active Problems:   Hypertension: Blood pressure stable   GERD (gastroesophageal reflux disease)   UTI (urinary tract infection): No urine culture drawn.  Polyuria has since ceased.  On empiric Rocephin   AKI (acute kidney injury) ([place]): Secondary to UTI.  Resolved with IV fluids.  Morbid obesity: Patient meets criteria with BMI greater than 40  Code Status: Full code  Family Communication: Updated daughter by phone   Disposition Plan: Likely discharge next week. PT recommending SNF.  Daughter agrees.  Pt would need negative test.  (Would need to be off O2 & completed Remdisivir)   Consultants:  None   Procedures:  None   Antimicrobials:  IV rocephin 7/22-7/24  DVT prophylaxis: Lovenox   Objective: Vitals:   06/10/19 2120 06/11/19 0600  BP: 118/71   Pulse:    Resp:  (!) 22  Temp:  (!) 97.5 F (36.4 C)  SpO2: 93%     Intake/Output Summary (Last 24 hours) at 06/11/2019 1131 Last data filed at 06/11/2019 0600 Gross per 24 hour  Intake 2279.28 ml  Output 750 ml  Net 1529.28 ml   Filed Weights   06/08/19 0025 06/08/19 0608  Weight: 136.1 kg (!) 139.1 kg   Body mass index is 43.99 kg/m.  Exam:   General: Alert and oriented x3, no acute distress  HEENT: Normocephalic and atraumatic, mucous membranes are moist  Neck: Thick, narrow airway  Cardiovascular: Regular rate and rhythm, S1-S2  Respiratory: Decreased breath sounds throughout secondary to body habitus, otherwise clear  Abdomen: Soft, nontender, nondistended, positive bowel sounds  Musculoskeletal: No clubbing or cyanosis, trace pitting edema  Skin: No skin breaks, tears or lesions  Neuro: No focal deficits  Psychiatry: Appropriate, no evidence of psychoses   Data Reviewed: CBC: Recent Labs  Lab 06/08/19 0028 06/08/19 1225 06/09/19 0429 06/11/19 0045  WBC 10.3 8.9 7.5 10.9*  HGB 15.7* 14.2 13.8 12.7  HCT 48.7* 44.6 44.0 40.6  MCV 81.3 81.1 80.9 82.4  PLT 398 405* 448* 329*   Basic Metabolic Panel: Recent Labs  Lab 06/08/19 0028 06/08/19 1225 06/09/19 0429 06/11/19 0045  NA 139 140 138 139  K 3.5 3.2* 4.8 3.8  CL 101 105 105 107  CO2 24 24 23 23   GLUCOSE 130* 122* 166* 146*  BUN 20 21 20 23   CREATININE 1.22* 1.03* 0.95 0.91  CALCIUM 8.9 8.4* 8.4* 8.3*   GFR: Estimated Creatinine Clearance: 95.3 mL/min (by C-G formula based on SCr of 0.91 mg/dL). Liver Function Tests: Recent Labs  Lab  06/08/19 2302 06/09/19 0429 06/11/19 0045  AST 34 41 24  ALT 34 29 27  ALKPHOS 75 71 71  BILITOT 1.3* 2.0* 0.8  PROT 6.6 6.5 6.3*  ALBUMIN 2.4* 2.5* 2.7*   No results for input(s): LIPASE, AMYLASE in the last 168 hours. No results for input(s): AMMONIA in the last 168 hours. Coagulation Profile: No results for input(s): INR, PROTIME in the last 168 hours. Cardiac Enzymes: No results for input(s): CKTOTAL, CKMB, CKMBINDEX, TROPONINI in the last 168 hours. BNP (last 3 results) No results for input(s): PROBNP in the last 8760 hours. HbA1C: No results for input(s): HGBA1C in the last 72 hours. CBG: No results for input(s): GLUCAP in the last 168 hours. Lipid Profile: No results for input(s): CHOL, HDL, LDLCALC, TRIG, CHOLHDL, LDLDIRECT in the last 72 hours. Thyroid Function Tests: No results for input(s): TSH, T4TOTAL, FREET4, T3FREE, THYROIDAB in the last 72 hours. Anemia Panel: Recent Labs    06/10/19 1250 06/11/19 0045  FERRITIN 219 181   Urine analysis:    Component Value Date/Time   COLORURINE AMBER (A) 06/08/2019 0026   APPEARANCEUR CLOUDY (A) 06/08/2019 0026   LABSPEC 1.025 06/08/2019 0026   PHURINE 6.0 06/08/2019 0026   GLUCOSEU NEGATIVE 06/08/2019 0026   HGBUR LARGE (A) 06/08/2019 0026   BILIRUBINUR MODERATE (A) 06/08/2019 0026   KETONESUR 15 (A) 06/08/2019 0026   PROTEINUR 100 (A) 06/08/2019 0026   UROBILINOGEN 0.2 11/23/2008 1126   NITRITE POSITIVE (A) 06/08/2019 0026   LEUKOCYTESUR LARGE (A) 06/08/2019 0026   Sepsis Labs: @LABRCNTIP (procalcitonin:4,lacticidven:4)  ) Recent Results (from the past 240 hour(s))  SARS Coronavirus 2 (CEPHEID - Performed in [place] hospital lab), Hosp Order     Status: Abnormal   Collection Time: 06/08/19  4:15 AM   Specimen: Nasopharyngeal Swab  Result Value Ref Range Status   SARS Coronavirus 2 POSITIVE (A) NEGATIVE Final    Comment: RESULT CALLED TO, READ BACK BY AND VERIFIED WITH: [name] 0605 06/08/2019 T.  TYSOR (NOTE) If result is NEGATIVE SARS-CoV-2 target nucleic acids are NOT DETECTED. The SARS-CoV-2 RNA is generally detectable in upper and lower  respiratory specimens during the acute phase of infection. The lowest  concentration of SARS-CoV-2 viral copies this assay can detect is 250  copies / mL. A negative result does not preclude SARS-CoV-2 infection  and should not be used as the sole basis for treatment or other  patient management decisions.  A negative result may occur with  improper specimen collection / handling, submission of specimen other  than nasopharyngeal swab, presence of viral mutation(s) within the  areas targeted by this assay, and inadequate number of viral copies  (<250 copies / mL). A negative result must be combined with clinical  observations, patient history, and epidemiological information. If result is POSITIVE SARS-CoV-2 target nucleic acids are DETECTED.  The SARS-CoV-2 RNA is generally detectable in upper and lower  respiratory specimens during the acute phase of infection.  Positive  results are indicative of active infection with  SARS-CoV-2.  Clinical  correlation with patient history and other diagnostic information is  necessary to determine patient infection status.  Positive results do  not rule out bacterial infection or co-infection with other viruses. If result is PRESUMPTIVE POSTIVE SARS-CoV-2 nucleic acids MAY BE PRESENT.   A presumptive positive result was obtained on the submitted specimen  and confirmed on repeat testing.  While 2019 novel coronavirus  (SARS-CoV-2) nucleic acids may be present in the submitted sample  additional confirmatory testing may be necessary for epidemiological  and / or clinical management purposes  to differentiate between  SARS-CoV-2 and other Sarbecovirus currently known to infect humans.  If clinically indicated additional testing with an alternate test  methodology [phone])  is advised. The SARS-CoV-2  RNA is generally  detectable in upper and lower respiratory specimens during the acute  phase of infection. The expected result is Negative. Fact Sheet for Patients:  StrictlyIdeas.no Fact Sheet for Healthcare Providers: BankingDealers.co.za This test is not yet approved or cleared by the [place] FDA and has been authorized for detection and/or diagnosis of SARS-CoV-2 by FDA under an Emergency Use Authorization (EUA).  This EUA will remain in effect (meaning this test can be used) for the duration of the COVID-19 declaration under Section 564(b)(1) of the Act, 21 U.S.C. section 360bbb-3(b)(1), unless the authorization is terminated or revoked sooner. Performed at [facility], [place] [address] [place], [place] [address]   Culture, blood (Routine X 2) w Reflex to ID Panel     Status: None (Preliminary result)   Collection Time: 06/08/19  4:20 AM   Specimen: BLOOD RIGHT ARM  Result Value Ref Range Status   Specimen Description BLOOD RIGHT ARM  Final   Special Requests   Final    BOTTLES DRAWN AEROBIC AND ANAEROBIC Blood Culture adequate volume   Culture   Final    NO GROWTH 3 DAYS Performed at [facility], [place] [address], [place], [place] [address]    Report Status PENDING  Incomplete  Culture, blood (Routine X 2) w Reflex to ID Panel     Status: None (Preliminary result)   Collection Time: 06/08/19  4:34 AM   Specimen: BLOOD LEFT ARM  Result Value Ref Range Status   Specimen Description BLOOD LEFT ARM  Final   Special Requests   Final    BOTTLES DRAWN AEROBIC ONLY Blood Culture adequate volume   Culture   Final    NO GROWTH 3 DAYS Performed at [facility], [address] [place], [place] [address]    Report Status PENDING  Incomplete      Studies: No results found.  Scheduled Meds: . enoxaparin (LOVENOX) injection  40 mg Subcutaneous Q12H  . methylPREDNISolone (SOLU-MEDROL) injection  60 mg  Intravenous Q12H  . pantoprazole  40 mg Oral Q1200  . sodium chloride flush  3 mL Intravenous Once  . vitamin C  500 mg Oral Daily  . zinc sulfate  220 mg Oral Daily    Continuous Infusions: . remdesivir 100 mg in NS 250 mL 100 mg (06/10/19 2114)     LOS: 2 days     Annita Brod, MD [facility]  To reach me or the doctor on call, go to: [email or web address] Password [email or web address]  06/11/2019, 11:31 AM

## 2019-06-12 DIAGNOSIS — N390 Urinary tract infection, site not specified: Secondary | ICD-10-CM

## 2019-06-12 MED ORDER — PREDNISONE 20 MG PO TABS
50.0000 mg | ORAL_TABLET | Freq: Every day | ORAL | Status: DC
  Administered 2019-06-13 – 2019-06-15 (×3): 50 mg via ORAL
  Filled 2019-06-12 (×3): qty 1

## 2019-06-12 NOTE — Progress Notes (Signed)
PROGRESS NOTE  Kristen Shaffer OYD:[id number] DOB: 10-23-1956 DOA: 06/08/2019 PCP: Kristen Ebbs, MD  HPI/Recap of past 58 hours: 63 year old morbidly obese female with history of hypertension, asthma, depression, GERD, rheumatic fever in her childhood presented on 7/22 to the emergency room with generalized weakness and increased urinary frequency with mild hematuria for 3-4 days progressively getting worse.    No chest pain or shortness of breath.   In the ED she was found to be tachycardic and tachypneic with O2 sat of 96% on room air, afebrile.  UA was suggestive of UTI with mild acute kidney injury and WBC of 10.3.  Chest x-ray negative, but CT noted bilateral infiltrates consistent with pneumonia, especially seen in COVID. Patient placed on telemetry.  COVID-19 was tested given her generalized weakness which was positive.  On day 2 she was noted to desaturate her oxygen in the 80s and required 4 L via nasal cannula.  Given concerns for respiratory decompensation, patient transferred to [facility] due to COVID infection on night of 7/23.  Over the last few days, patient has dramatically improved.  Was able to wean off of oxygen by 7/26.  She has no complaints.  Assessment/Plan: Principal Problem: COVID-19 infection causing acute respiratory failure with hypoxia ([place]): Stable, currently on 2 L nasal cannula.  Continue Solu-Medrol and day 4/5 of Remdisivir.  Has received 1 dose of Actemra.  Inflammatory markers continue to trend downward Active Problems:   Hypertension: Blood pressure stable   GERD (gastroesophageal reflux disease)   UTI (urinary tract infection): No urine culture drawn.  Polyuria has since ceased.  On empiric Rocephin   AKI (acute kidney injury) ([place]): Secondary to UTI.  Resolved with IV fluids.  Morbid obesity: Patient meets criteria with BMI greater than 40  Code Status: Full code  Family Communication: Updated daughter by phone  Disposition Plan: Likely  discharge hopefully this week. PT recommending SNF.  Daughter agrees.  Pt would need negative test.   Consultants:  None   Procedures:  None   Antimicrobials:  IV rocephin 7/22-7/24  DVT prophylaxis: Lovenox   Objective: Vitals:   06/12/19 0545 06/12/19 0700  BP: (!) 146/87 134/76  Pulse: 85 66  Resp: 18 (!) 23  Temp: 98 F (36.7 C) 97.6 F (36.4 C)  SpO2: 95% 91%    Intake/Output Summary (Last 24 hours) at 06/12/2019 1158 Last data filed at 06/12/2019 0300 Gross per 24 hour  Intake 298 ml  Output -  Net 298 ml   Filed Weights   06/08/19 0025 06/08/19 0608  Weight: 136.1 kg (!) 139.1 kg   Body mass index is 43.99 kg/m.  Exam:   General: Alert and oriented x3, no acute distress  HEENT: Normocephalic and atraumatic, mucous membranes are moist  Neck: Thick, narrow airway  Cardiovascular: Regular rate and rhythm, S1-S2  Respiratory: Decreased breath sounds throughout secondary to body habitus, otherwise clear  Abdomen: Soft, nontender, nondistended, positive bowel sounds  Musculoskeletal: No clubbing or cyanosis, trace pitting edema  Skin: No skin breaks, tears or lesions  Neuro: No focal deficits  Psychiatry: Appropriate, no evidence of psychoses   Data Reviewed: CBC: Recent Labs  Lab 06/08/19 0028 06/08/19 1225 06/09/19 0429 06/11/19 0045  WBC 10.3 8.9 7.5 10.9*  HGB 15.7* 14.2 13.8 12.7  HCT 48.7* 44.6 44.0 40.6  MCV 81.3 81.1 80.9 82.4  PLT 398 405* 448* 672*   Basic Metabolic Panel: Recent Labs  Lab 06/08/19 0028 06/08/19 1225 06/09/19 0429 06/11/19  0045  NA 139 140 138 139  K 3.5 3.2* 4.8 3.8  CL 101 105 105 107  CO2 24 24 23 23   GLUCOSE 130* 122* 166* 146*  BUN 20 21 20 23   CREATININE 1.22* 1.03* 0.95 0.91  CALCIUM 8.9 8.4* 8.4* 8.3*   GFR: Estimated Creatinine Clearance: 95.3 mL/min (by C-G formula based on SCr of 0.91 mg/dL). Liver Function Tests: Recent Labs  Lab 06/08/19 2302 06/09/19 0429 06/11/19 0045   AST 34 41 24  ALT 34 29 27  ALKPHOS 75 71 71  BILITOT 1.3* 2.0* 0.8  PROT 6.6 6.5 6.3*  ALBUMIN 2.4* 2.5* 2.7*   No results for input(s): LIPASE, AMYLASE in the last 168 hours. No results for input(s): AMMONIA in the last 168 hours. Coagulation Profile: No results for input(s): INR, PROTIME in the last 168 hours. Cardiac Enzymes: No results for input(s): CKTOTAL, CKMB, CKMBINDEX, TROPONINI in the last 168 hours. BNP (last 3 results) No results for input(s): PROBNP in the last 8760 hours. HbA1C: No results for input(s): HGBA1C in the last 72 hours. CBG: No results for input(s): GLUCAP in the last 168 hours. Lipid Profile: No results for input(s): CHOL, HDL, LDLCALC, TRIG, CHOLHDL, LDLDIRECT in the last 72 hours. Thyroid Function Tests: No results for input(s): TSH, T4TOTAL, FREET4, T3FREE, THYROIDAB in the last 72 hours. Anemia Panel: Recent Labs    06/10/19 1250 06/11/19 0045  FERRITIN 219 181   Urine analysis:    Component Value Date/Time   COLORURINE AMBER (A) 06/08/2019 0026   APPEARANCEUR CLOUDY (A) 06/08/2019 0026   LABSPEC 1.025 06/08/2019 0026   PHURINE 6.0 06/08/2019 0026   GLUCOSEU NEGATIVE 06/08/2019 0026   HGBUR LARGE (A) 06/08/2019 0026   BILIRUBINUR MODERATE (A) 06/08/2019 0026   KETONESUR 15 (A) 06/08/2019 0026   PROTEINUR 100 (A) 06/08/2019 0026   UROBILINOGEN 0.2 11/23/2008 1126   NITRITE POSITIVE (A) 06/08/2019 0026   LEUKOCYTESUR LARGE (A) 06/08/2019 0026   Sepsis Labs: @LABRCNTIP (procalcitonin:4,lacticidven:4)  ) Recent Results (from the past 240 hour(s))  SARS Coronavirus 2 (CEPHEID - Performed in [facility] hospital lab), Hosp Order     Status: Abnormal   Collection Time: 06/08/19  4:15 AM   Specimen: Nasopharyngeal Swab  Result Value Ref Range Status   SARS Coronavirus 2 POSITIVE (A) NEGATIVE Final    Comment: RESULT CALLED TO, READ BACK BY AND VERIFIED WITH: [name] 0605 06/08/2019 T. TYSOR (NOTE) If result is NEGATIVE  SARS-CoV-2 target nucleic acids are NOT DETECTED. The SARS-CoV-2 RNA is generally detectable in upper and lower  respiratory specimens during the acute phase of infection. The lowest  concentration of SARS-CoV-2 viral copies this assay can detect is 250  copies / mL. A negative result does not preclude SARS-CoV-2 infection  and should not be used as the sole basis for treatment or other  patient management decisions.  A negative result may occur with  improper specimen collection / handling, submission of specimen other  than nasopharyngeal swab, presence of viral mutation(s) within the  areas targeted by this assay, and inadequate number of viral copies  (<250 copies / mL). A negative result must be combined with clinical  observations, patient history, and epidemiological information. If result is POSITIVE SARS-CoV-2 target nucleic acids are DETECTED.  The SARS-CoV-2 RNA is generally detectable in upper and lower  respiratory specimens during the acute phase of infection.  Positive  results are indicative of active infection with SARS-CoV-2.  Clinical  correlation with patient history  and other diagnostic information is  necessary to determine patient infection status.  Positive results do  not rule out bacterial infection or co-infection with other viruses. If result is PRESUMPTIVE POSTIVE SARS-CoV-2 nucleic acids MAY BE PRESENT.   A presumptive positive result was obtained on the submitted specimen  and confirmed on repeat testing.  While 2019 novel coronavirus  (SARS-CoV-2) nucleic acids may be present in the submitted sample  additional confirmatory testing may be necessary for epidemiological  and / or clinical management purposes  to differentiate between  SARS-CoV-2 and other Sarbecovirus currently known to infect humans.  If clinically indicated additional testing with an alternate test  methodology [phone])  is advised. The SARS-CoV-2 RNA is generally  detectable in upper  and lower respiratory specimens during the acute  phase of infection. The expected result is Negative. Fact Sheet for Patients:  StrictlyIdeas.no Fact Sheet for Healthcare Providers: BankingDealers.co.za This test is not yet approved or cleared by the [place] FDA and has been authorized for detection and/or diagnosis of SARS-CoV-2 by FDA under an Emergency Use Authorization (EUA).  This EUA will remain in effect (meaning this test can be used) for the duration of the COVID-19 declaration under Section 564(b)(1) of the Act, 21 U.S.C. section 360bbb-3(b)(1), unless the authorization is terminated or revoked sooner. Performed at [facility], [place] [address], [place], [place] [address]   Culture, blood (Routine X 2) w Reflex to ID Panel     Status: None (Preliminary result)   Collection Time: 06/08/19  4:20 AM   Specimen: BLOOD RIGHT ARM  Result Value Ref Range Status   Specimen Description BLOOD RIGHT ARM  Final   Special Requests   Final    BOTTLES DRAWN AEROBIC AND ANAEROBIC Blood Culture adequate volume   Culture   Final    NO GROWTH 4 DAYS Performed at [facility], [place] [address] [place], [place] [address]    Report Status PENDING  Incomplete  Culture, blood (Routine X 2) w Reflex to ID Panel     Status: None (Preliminary result)   Collection Time: 06/08/19  4:34 AM   Specimen: BLOOD LEFT ARM  Result Value Ref Range Status   Specimen Description BLOOD LEFT ARM  Final   Special Requests   Final    BOTTLES DRAWN AEROBIC ONLY Blood Culture adequate volume   Culture   Final    NO GROWTH 4 DAYS Performed at [facility], [address], [place], [place] [address]    Report Status PENDING  Incomplete      Studies: No results found.  Scheduled Meds: . enoxaparin (LOVENOX) injection  40 mg Subcutaneous Q12H  . methylPREDNISolone (SOLU-MEDROL) injection  60 mg Intravenous Q12H  . pantoprazole  40 mg Oral  Q1200  . sodium chloride flush  3 mL Intravenous Once  . vitamin C  500 mg Oral Daily  . zinc sulfate  220 mg Oral Daily    Continuous Infusions: . remdesivir 100 mg in NS 250 mL Stopped (06/11/19 2218)     LOS: 3 days     Annita Brod, MD [facility]  To reach me or the doctor on call, go to: [email or web address] Password [facility]  06/12/2019, 11:58 AM

## 2019-06-12 NOTE — NC FL2 (Addendum)
[place] LEVEL OF CARE SCREENING TOOL     IDENTIFICATION  Patient Name: Kristen Shaffer Birthdate: June 19, 1956 Sex: female Admission Date (Current Location): 06/08/2019  [facility] and [place] Number:  Herbalist and Address:  [facility] [place], [place] [address], [place], [place] [address]      Provider Number: [id number]  Attending Physician Name and Address:  Annita Brod, MD  Relative Name and Phone Number:  [phone]    Current Level of Care: Hospital Recommended Level of Care: [place] Prior Approval Number:    Date Approved/Denied:   PASRR Number: [id number]   Discharge Plan: SNF    Current Diagnoses: Patient Active Problem List   Diagnosis Date Noted  . Morbid obesity ([place]) 06/11/2019  . Weakness   . Acute respiratory failure with hypoxia ([place]) 06/09/2019  . COVID-19 virus infection 06/08/2019  . GERD (gastroesophageal reflux disease)   . UTI (urinary tract infection)   . AKI (acute kidney injury) ([place])   . Arthritis   . Hypertension   . Depression   . Fibroid     Orientation RESPIRATION BLADDER Height & Weight     Self, Time, Situation, Place  O2(2 L/min nasal cannula intermittently) Continent Weight: (!) 306 lb 9.6 oz (139.1 kg)(scale a) Height:  5\' 10"  (177.8 cm)  BEHAVIORAL SYMPTOMS/MOOD NEUROLOGICAL BOWEL NUTRITION STATUS      Continent Diet(see discharge summary)  AMBULATORY STATUS COMMUNICATION OF NEEDS Skin   Limited Assist(rolling walker) Verbally Normal                       Personal Care Assistance Level of Assistance  Bathing, Feeding, Dressing, Total care Bathing Assistance: Limited assistance Feeding assistance: Independent Dressing Assistance: Limited assistance Total Care Assistance: Limited assistance   Functional Limitations Info  Sight, Hearing, Speech Sight Info: Adequate Hearing Info: Adequate Speech Info: Adequate    SPECIAL CARE FACTORS  FREQUENCY  PT (By licensed PT), OT (By licensed OT)     PT Frequency: min 5x weekly OT Frequency: min 5x weekly            Contractures Contractures Info: Not present    Additional Factors Info  Code Status, Allergies, Isolation Precautions Code Status Info: full Allergies Info: No Known Allergies     Isolation Precautions Info: emerging pathogen air and contact precautions     Current Medications (06/12/2019):  This is the current hospital active medication list Current Facility-Administered Medications  Medication Dose Route Frequency Provider Last Rate Last Dose  . acetaminophen (TYLENOL) tablet 650 mg  650 mg Oral Q6H PRN Ivor Costa, MD       Or  . acetaminophen (TYLENOL) suppository 650 mg  650 mg Rectal Q6H PRN Ivor Costa, MD      . enoxaparin (LOVENOX) injection 40 mg  40 mg Subcutaneous Q12H Rumbarger, Valeda Malm, RPH   40 mg at 06/12/19 0845  . hydrALAZINE (APRESOLINE) injection 5 mg  5 mg Intravenous Q2H PRN Ivor Costa, MD      . methylPREDNISolone sodium succinate (SOLU-MEDROL) 125 mg/2 mL injection 60 mg  60 mg Intravenous Q12H Domenic Polite, MD   60 mg at 06/12/19 0152  . ondansetron (ZOFRAN) tablet 4 mg  4 mg Oral Q6H PRN Ivor Costa, MD       Or  . ondansetron (ZOFRAN) injection 4 mg  4 mg Intravenous Q6H PRN Ivor Costa, MD      . pantoprazole ([place]) EC  tablet 40 mg  40 mg Oral Q1200 Ivor Costa, MD   40 mg at 06/11/19 1222  . remdesivir 100 mg in sodium chloride 0.9 % 250 mL IVPB  100 mg Intravenous Q24H Einar Grad, [facility]   Stopped at 06/11/19 2218  . sodium chloride flush (NS) 0.9 % injection 10-40 mL  10-40 mL Intracatheter PRN Ivor Costa, MD      . sodium chloride flush (NS) 0.9 % injection 3 mL  3 mL Intravenous Once Ivor Costa, MD      . vitamin C (ASCORBIC ACID) tablet 500 mg  500 mg Oral Daily Ivor Costa, MD   500 mg at 06/12/19 0845  . zinc sulfate capsule 220 mg  220 mg Oral Daily Ivor Costa, MD   220 mg at 06/12/19 0845     Discharge  Medications: Please see discharge summary for a list of discharge medications.  Relevant Imaging Results:  Relevant Lab Results:   Additional Information SSN: [id number]  Alberteen Sam, LCSW

## 2019-06-13 LAB — D-DIMER, QUANTITATIVE: D-Dimer, Quant: 1.15 ug/mL-FEU — ABNORMAL HIGH (ref 0.00–0.50)

## 2019-06-13 LAB — CULTURE, BLOOD (ROUTINE X 2)
Culture: NO GROWTH
Culture: NO GROWTH
Special Requests: ADEQUATE
Special Requests: ADEQUATE

## 2019-06-13 LAB — FERRITIN: Ferritin: 154 ng/mL (ref 11–307)

## 2019-06-13 LAB — C-REACTIVE PROTEIN: CRP: <0.8 mg/dL (ref <1.0)

## 2019-06-13 MED ORDER — ENOXAPARIN SODIUM 80 MG/0.8ML ~~LOC~~ SOLN
70.0000 mg | SUBCUTANEOUS | Status: DC
  Administered 2019-06-13 – 2019-06-15 (×3): 70 mg via SUBCUTANEOUS
  Filled 2019-06-13 (×3): qty 0.8

## 2019-06-13 NOTE — Evaluation (Signed)
Occupational Therapy Evaluation Patient Details Name: Kristen Shaffer MRN: [id number] DOB: 1956/06/23 Today's Date: 06/13/2019    History of Present Illness Pt adm with weakness, UTI, and Covid positive. Pt also developed hypoxia. PMH - obesity, depression, arthritis, back pain, htn   Clinical Impression   PTA, pt was living alone and was performing ADLs, simple IADLs, and using rollator for functional mobility; daughter assisting with grocery shopping. Pt currently requiring Min Guard A for ADLs in standing, Min A for LB ADLs, and Min A for functional mobility with rollator. Pt presenting with decreased activity tolerance as seen by fatigue and required seated rest breaks with simple activity. Pt would benefit from further acute OT to facilitate safe dc. Recommend dc to SNF for further OT to optimize safety, independence with ADLs, and return to PLOF.      Follow Up Recommendations  SNF;Supervision/Assistance - 24 hour    Equipment Recommendations  Other (comment)(Defer to next venue)    Recommendations for Other Services PT consult     Precautions / Restrictions Precautions Precautions: Fall Restrictions Weight Bearing Restrictions: No      Mobility Bed Mobility Overal bed mobility: Needs Assistance Bed Mobility: Sidelying to Sit;Sit to Supine   Sidelying to sit: Supervision;HOB elevated       General bed mobility comments: Incr time and effort and use of rail to come to sitting. Assist to bring legs back up into bed to return to supine.  Transfers Overall transfer level: Needs assistance Equipment used: Rolling walker (2 wheeled) Transfers: Sit to/from Stand Sit to Stand: +2 safety/equipment;Min assist         General transfer comment: Min A for gaining balance once in standing    Balance Overall balance assessment: Needs assistance Sitting-balance support: No upper extremity supported;Feet supported Sitting balance-Leahy Scale: Fair     Standing balance  support: Single extremity supported;During functional activity Standing balance-Leahy Scale: Poor Standing balance comment: UE support and min guard for static standing                           ADL either performed or assessed with clinical judgement   ADL Overall ADL's : Needs assistance/impaired Eating/Feeding: Supervision/ safety;Set up;Sitting   Grooming: Wash/dry face;Min guard;Standing Grooming Details (indicate cue type and reason): Fatigues quickly Upper Body Bathing: Min guard;Standing Upper Body Bathing Details (indicate cue type and reason): Pt washing her axillary areas with Min Guard A for safety. Pt fatigues quickly and required seated rest break Lower Body Bathing: Minimal assistance;Sit to/from stand   Upper Body Dressing : Min guard;Sitting Upper Body Dressing Details (indicate cue type and reason): donned secong gown like a jacket Lower Body Dressing: Minimal assistance;Sit to/from stand   Toilet Transfer: Min guard;Ambulation(rollator and simulated to recliner) Toilet Transfer Details (indicate cue type and reason): Min Guard A for safety         Functional mobility during ADLs: Min guard(rollator) General ADL Comments: Pt presenting with decreased activity tolerance and fatigued quickly     Vision Baseline Vision/History: Wears glasses Wears Glasses: At all times Patient Visual Report: No change from baseline       Perception     Praxis      Pertinent Vitals/Pain Pain Assessment: Faces Faces Pain Scale: Hurts a little bit Pain Location: Generalized Pain Descriptors / Indicators: Constant;Discomfort Pain Intervention(s): Monitored during session;Repositioned     Hand Dominance Right   Extremity/Trunk Assessment Upper Extremity Assessment Upper Extremity Assessment: Generalized  weakness   Lower Extremity Assessment Lower Extremity Assessment: Generalized weakness   Cervical / Trunk Assessment Cervical / Trunk Assessment: Other  exceptions Cervical / Trunk Exceptions: Increased body habitus   Communication Communication Communication: No difficulties   Cognition Arousal/Alertness: Awake/alert Behavior During Therapy: Flat affect Overall Cognitive Status: Within Functional Limits for tasks assessed                                 General Comments: Agreeable to therapy   General Comments  SpO2 >96% on RA    Exercises     Shoulder Instructions      Home Living Family/patient expects to be discharged to:: Private residence Living Arrangements: Alone Available Help at Discharge: Family;Available PRN/intermittently Type of Home: Apartment Home Access: Stairs to enter Entrance Stairs-Number of Steps: 3(3+1+1) Entrance Stairs-Rails: None Home Layout: One level           Bathroom Accessibility: No   Home Equipment: Walker - 4 wheels(bariatric)          Prior Functioning/Environment Level of Independence: Needs assistance  Gait / Transfers Assistance Needed: Modified independent household distances with rollator. Rollator won't fit in bathroom. ADL's / Homemaking Assistance Needed: Daughter does grocery shopping    Comments: Pt reports she has been depressed and doesn't leave her apt. Keeps blinds drawn.         OT Problem List: Decreased strength;Decreased range of motion;Decreased activity tolerance;Impaired balance (sitting and/or standing);Decreased knowledge of use of DME or AE;Decreased knowledge of precautions      OT Treatment/Interventions: Self-care/ADL training;Therapeutic exercise;Energy conservation;DME and/or AE instruction;Therapeutic activities;Patient/family education    OT Goals(Current goals can be found in the care plan section) Acute Rehab OT Goals Patient Stated Goal: not stated OT Goal Formulation: With patient Time For Goal Achievement: 06/27/19 Potential to Achieve Goals: Good  OT Frequency: Min 2X/week   Barriers to D/C:             Co-evaluation PT/OT/SLP Co-Evaluation/Treatment: Yes Reason for Co-Treatment: To address functional/ADL transfers;Complexity of the patient's impairments (multi-system involvement)   OT goals addressed during session: ADL's and self-care      AM-PAC OT "6 Clicks" Daily Activity     Outcome Measure Help from another person eating meals?: None Help from another person taking care of personal grooming?: A Little Help from another person toileting, which includes using toliet, bedpan, or urinal?: A Little Help from another person bathing (including washing, rinsing, drying)?: A Little Help from another person to put on and taking off regular upper body clothing?: A Little Help from another person to put on and taking off regular lower body clothing?: A Little 6 Click Score: 19   End of Session Equipment Utilized During Treatment: Other (comment)(Rollator) Nurse Communication: Mobility status  Activity Tolerance: Patient tolerated treatment well Patient left: in chair;with call bell/phone within reach  OT Visit Diagnosis: Unsteadiness on feet (R26.81);Other abnormalities of gait and mobility (R26.89);Muscle weakness (generalized) (M62.81);History of falling (Z91.81)                Time: 5885-0277 OT Time Calculation (min): 20 min Charges:  OT General Charges $OT Visit: 1 Visit OT Evaluation $OT Eval Moderate Complexity: [place], OTR/L Acute Rehab Pager: [phone] Office: [place] 06/13/2019, 10:35 AM

## 2019-06-13 NOTE — Progress Notes (Signed)
Physical Therapy Treatment Patient Details Name: Kristen Shaffer MRN: [id number] DOB: 1956-08-14 Today's Date: 06/13/2019    History of Present Illness Pt adm with weakness, UTI, and Covid positive. Pt also developed hypoxia. PMH - obesity, depression, arthritis, back pain, htn    PT Comments    The patient is improving in mobility. Mobil;iuzing on RA with SaO2 > 90%. Patient will benefit  From SNF to return to mod. Independent level.   Follow Up Recommendations  SNF;Supervision/Assistance - 24 hour     Equipment Recommendations  None recommended by PT    Recommendations for Other Services       Precautions / Restrictions Precautions Precautions: Fall    Mobility  Bed Mobility   Bed Mobility: Sidelying to Sit;Sit to Supine   Sidelying to sit: Supervision;HOB elevated       General bed mobility comments: Incr time and effort and use of rail to come to sitting. Assist to bring legs back up into bed to return to supine.  Transfers Overall transfer level: Needs assistance Equipment used: 4-wheeled walker Transfers: Sit to/from Stand Sit to Stand: +2 safety/equipment;Min assist         General transfer comment: Min A for gaining balance once in standing  Ambulation/Gait Ambulation/Gait assistance: Min assist;+2 safety/equipment Gait Distance (Feet): 15 Feet(then30') Assistive device: 4-wheeled walker Gait Pattern/deviations: Decreased step length - right;Decreased step length - left;Step-through pattern;Shuffle;Trunk flexed Gait velocity: decr   General Gait Details: patient turned and sat on Rollator after standing at sink. Cues for braking and unbraking. manages Rollator in turns and reverse.   Stairs             Wheelchair Mobility    Modified Rankin (Stroke Patients Only)       Balance   Sitting-balance support: No upper extremity supported;Feet supported Sitting balance-Leahy Scale: Fair     Standing balance support: Single extremity  supported;During functional activity Standing balance-Leahy Scale: Poor Standing balance comment: UE support and min guard for static standing                            Cognition Arousal/Alertness: Awake/alert Behavior During Therapy: Flat affect Overall Cognitive Status: Within Functional Limits for tasks assessed                                        Exercises      General Comments        Pertinent Vitals/Pain Faces Pain Scale: Hurts a little bit Pain Location: Generalized Pain Descriptors / Indicators: Constant;Discomfort Pain Intervention(s): Monitored during session    Home Living                      Prior Function            PT Goals (current goals can now be found in the care plan section) Progress towards PT goals: Progressing toward goals    Frequency    Min 2X/week      PT Plan Current plan remains appropriate    Co-evaluation              AM-PAC PT "6 Clicks" Mobility   Outcome Measure  Help needed turning from your back to your side while in a flat bed without using bedrails?: None Help needed moving from lying on your back to sitting on the  side of a flat bed without using bedrails?: None Help needed moving to and from a bed to a chair (including a wheelchair)?: A Little Help needed standing up from a chair using your arms (e.g., wheelchair or bedside chair)?: A Little Help needed to walk in hospital room?: A Little Help needed climbing 3-5 steps with a railing? : A Lot 6 Click Score: 19    End of Session Equipment Utilized During Treatment: Gait belt Activity Tolerance: Patient tolerated treatment well Patient left: in chair;with call bell/phone within reach Nurse Communication: Mobility status PT Visit Diagnosis: Other abnormalities of gait and mobility (R26.89);Unsteadiness on feet (R26.81);Muscle weakness (generalized) (M62.81)     Time: 0017-4944 PT Time Calculation (min) (ACUTE ONLY): 21  min  Charges:  $Gait Training: 8-22 mins                     [place]  Office [phone]    [place], [place] 06/13/2019, 2:07 PM

## 2019-06-13 NOTE — Progress Notes (Signed)
Physical Therapy Treatment Patient Details Name: Kristen Shaffer MRN: [id number] DOB: 11-15-1956 Today's Date: 06/13/2019    History of Present Illness Pt adm with weakness, UTI, and Covid positive. Pt also developed hypoxia. PMH - obesity, depression, arthritis, back pain, htn    PT Comments    The patient  Assisted  Self mostly to [facility] and then to the bed, supporting on armrests and bed. Assisted with pericare. Patient will benefit from Short rehab stay at SNF   Follow Up Recommendations  SNF;Supervision/Assistance - 24 hour     Equipment Recommendations  None recommended by PT    Recommendations for Other Services       Precautions / Restrictions Precautions Precautions: Fall    Mobility  Bed Mobility Overal bed mobility: Modified Independent Bed Mobility: Sit to Supine   Sidelying to sit: Supervision;HOB elevated       General bed mobility comments: no assistance required  Transfers Overall transfer level: Needs assistance Equipment used: 4-wheeled walker Transfers: Sit to/from [facility] Sit to Stand: Min guard Stand pivot transfers: Min guard       General transfer comment: patient  assisted self to [facility] then to the bed, held to armrests and bed for support.  Ambulation/Gait Ambulation/Gait assistance: Min assist;+2 safety/equipment Gait Distance (Feet): 15 Feet(then30') Assistive device: 4-wheeled walker Gait Pattern/deviations: Decreased step length - right;Decreased step length - left;Step-through pattern;Shuffle;Trunk flexed Gait velocity: decr   General Gait Details: patient turned and sat on Rollator after standing at sink. Cues for braking and unbraking. manages Rollator in turns and reverse.   Stairs             Wheelchair Mobility    Modified Rankin (Stroke Patients Only)       Balance   Sitting-balance support: No upper extremity supported;Feet supported Sitting balance-Leahy Scale: Fair     Standing balance  support: Single extremity supported;During functional activity Standing balance-Leahy Scale: Poor Standing balance comment: UE support and min guard for static standing                            Cognition Arousal/Alertness: Awake/alert Behavior During Therapy: Flat affect Overall Cognitive Status: Within Functional Limits for tasks assessed                                        Exercises      General Comments        Pertinent Vitals/Pain Faces Pain Scale: No hurt Pain Location: Generalized Pain Descriptors / Indicators: Constant;Discomfort Pain Intervention(s): Monitored during session    Home Living                      Prior Function            PT Goals (current goals can now be found in the care plan section) Progress towards PT goals: Progressing toward goals    Frequency    Min 2X/week      PT Plan Current plan remains appropriate    Co-evaluation              AM-PAC PT "6 Clicks" Mobility   Outcome Measure  Help needed turning from your back to your side while in a flat bed without using bedrails?: None Help needed moving from lying on your back to sitting on the side of a  flat bed without using bedrails?: None Help needed moving to and from a bed to a chair (including a wheelchair)?: A Little Help needed standing up from a chair using your arms (e.g., wheelchair or bedside chair)?: A Little Help needed to walk in hospital room?: A Little Help needed climbing 3-5 steps with a railing? : A Lot 6 Click Score: 19    End of Session Equipment Utilized During Treatment: Gait belt Activity Tolerance: Patient tolerated treatment well Patient left: in bed;with call bell/phone within reach Nurse Communication: Mobility status PT Visit Diagnosis: Other abnormalities of gait and mobility (R26.89);Unsteadiness on feet (R26.81);Muscle weakness (generalized) (M62.81)     Time: 1696-7893 PT Time Calculation (min)  (ACUTE ONLY): 19 min  Charges:  $Gait Training: 8-22 mins $Therapeutic Activity: 8-22 mins                     Kristen Shaffer PT Acute Rehabilitation Services  Office [phone]    Kristen Shaffer, Kristen Shaffer 06/13/2019, 4:37 PM

## 2019-06-13 NOTE — Progress Notes (Signed)
PROGRESS NOTE  Kristen Shaffer TMA:[id number] DOB: June 09, 1956 DOA: 06/08/2019 PCP: Nolene Ebbs, MD  HPI/Recap of past 19 hours: 63 year old morbidly obese female with history of hypertension, asthma, depression, GERD, rheumatic fever in her childhood presented on 7/22 to the emergency room with generalized weakness and increased urinary frequency with mild hematuria for 3-4 days progressively getting worse.    No chest pain or shortness of breath.   In the ED she was found to be tachycardic and tachypneic with O2 sat of 96% on room air, afebrile.  UA was suggestive of UTI with mild acute kidney injury and WBC of 10.3.  Chest x-ray negative, but CT noted bilateral infiltrates consistent with pneumonia, especially seen in COVID. Patient placed on telemetry.  COVID-19 was tested given her generalized weakness which was positive.  On day 2 she was noted to desaturate her oxygen in the 80s and required 4 L via nasal cannula.  Given concerns for respiratory decompensation, patient transferred to [place] due to COVID infection on night of 7/23.  Over the last few days, patient has dramatically improved.  Was able to wean off of oxygen by 7/26.  She has no complaints.  Working well with PT/OT who continue to recommend SNF.  Assessment/Plan: Principal Problem: COVID-19 infection causing acute respiratory failure with hypoxia ([place]): Stable, currently on 2 L nasal cannula.  Continue Solu-Medrol and day 4/5 of Remdisivir.  Has received 1 dose of Actemra.  Inflammatory markers normalized Active Problems:   Hypertension: Blood pressure stable   GERD (gastroesophageal reflux disease)   UTI (urinary tract infection): No urine culture drawn.  Polyuria has since ceased.  Completed course of empiric Rocephin   AKI (acute kidney injury) ([place]): Secondary to UTI.  Resolved with IV fluids.  Morbid obesity: Patient meets criteria with BMI greater than 40  Code Status: Full code  Family Communication:  Updated daughter by phone  Disposition Plan: Likely discharge hopefully this week. PT recommending SNF.  Daughter agrees.  Pt would need negative test or facility willing to take her with a positive test.   Consultants:  None   Procedures:  None   Antimicrobials:  IV rocephin 7/22-7/24  DVT prophylaxis: Lovenox   Objective: Vitals:   06/12/19 2032 06/13/19 0932  BP: 132/84 (!) 127/95  Pulse: 72 86  Resp: 20   Temp: 97.7 F (36.5 C) (!) 97.5 F (36.4 C)  SpO2: 93% 96%   No intake or output data in the 24 hours ending 06/13/19 1240 Filed Weights   06/08/19 0025 06/08/19 0608  Weight: 136.1 kg (!) 139.1 kg   Body mass index is 43.99 kg/m.  Exam:   General: Alert and oriented x3, no acute distress  HEENT: Normocephalic and atraumatic, mucous membranes are moist  Neck: Thick, narrow airway  Cardiovascular: Regular rate and rhythm, S1-S2  Respiratory: Decreased breath sounds throughout secondary to body habitus, otherwise clear  Abdomen: Soft, nontender, nondistended, positive bowel sounds  Musculoskeletal: No clubbing or cyanosis, trace pitting edema  Skin: No skin breaks, tears or lesions  Neuro: No focal deficits  Psychiatry: Appropriate, no evidence of psychoses   Data Reviewed: CBC: Recent Labs  Lab 06/08/19 0028 06/08/19 1225 06/09/19 0429 06/11/19 0045  WBC 10.3 8.9 7.5 10.9*  HGB 15.7* 14.2 13.8 12.7  HCT 48.7* 44.6 44.0 40.6  MCV 81.3 81.1 80.9 82.4  PLT 398 405* 448* 256*   Basic Metabolic Panel: Recent Labs  Lab 06/08/19 0028 06/08/19 1225 06/09/19 0429 06/11/19 0045  NA 139 140 138 139  K 3.5 3.2* 4.8 3.8  CL 101 105 105 107  CO2 24 24 23 23   GLUCOSE 130* 122* 166* 146*  BUN 20 21 20 23   CREATININE 1.22* 1.03* 0.95 0.91  CALCIUM 8.9 8.4* 8.4* 8.3*   GFR: Estimated Creatinine Clearance: 95.3 mL/min (by C-G formula based on SCr of 0.91 mg/dL). Liver Function Tests: Recent Labs  Lab 06/08/19 2302 06/09/19 0429  06/11/19 0045  AST 34 41 24  ALT 34 29 27  ALKPHOS 75 71 71  BILITOT 1.3* 2.0* 0.8  PROT 6.6 6.5 6.3*  ALBUMIN 2.4* 2.5* 2.7*   No results for input(s): LIPASE, AMYLASE in the last 168 hours. No results for input(s): AMMONIA in the last 168 hours. Coagulation Profile: No results for input(s): INR, PROTIME in the last 168 hours. Cardiac Enzymes: No results for input(s): CKTOTAL, CKMB, CKMBINDEX, TROPONINI in the last 168 hours. BNP (last 3 results) No results for input(s): PROBNP in the last 8760 hours. HbA1C: No results for input(s): HGBA1C in the last 72 hours. CBG: No results for input(s): GLUCAP in the last 168 hours. Lipid Profile: No results for input(s): CHOL, HDL, LDLCALC, TRIG, CHOLHDL, LDLDIRECT in the last 72 hours. Thyroid Function Tests: No results for input(s): TSH, T4TOTAL, FREET4, T3FREE, THYROIDAB in the last 72 hours. Anemia Panel: Recent Labs    06/11/19 0045 06/13/19 0555  FERRITIN 181 154   Urine analysis:    Component Value Date/Time   COLORURINE AMBER (A) 06/08/2019 0026   APPEARANCEUR CLOUDY (A) 06/08/2019 0026   LABSPEC 1.025 06/08/2019 0026   PHURINE 6.0 06/08/2019 0026   GLUCOSEU NEGATIVE 06/08/2019 0026   HGBUR LARGE (A) 06/08/2019 0026   BILIRUBINUR MODERATE (A) 06/08/2019 0026   KETONESUR 15 (A) 06/08/2019 0026   PROTEINUR 100 (A) 06/08/2019 0026   UROBILINOGEN 0.2 11/23/2008 1126   NITRITE POSITIVE (A) 06/08/2019 0026   LEUKOCYTESUR LARGE (A) 06/08/2019 0026   Sepsis Labs: @LABRCNTIP (procalcitonin:4,lacticidven:4)  ) Recent Results (from the past 240 hour(s))  SARS Coronavirus 2 (CEPHEID - Performed in [facility] hospital lab), Hosp Order     Status: Abnormal   Collection Time: 06/08/19  4:15 AM   Specimen: Nasopharyngeal Swab  Result Value Ref Range Status   SARS Coronavirus 2 POSITIVE (A) NEGATIVE Final    Comment: RESULT CALLED TO, READ BACK BY AND VERIFIED WITH: [name] 0605 06/08/2019 T. TYSOR (NOTE) If result is  NEGATIVE SARS-CoV-2 target nucleic acids are NOT DETECTED. The SARS-CoV-2 RNA is generally detectable in upper and lower  respiratory specimens during the acute phase of infection. The lowest  concentration of SARS-CoV-2 viral copies this assay can detect is 250  copies / mL. A negative result does not preclude SARS-CoV-2 infection  and should not be used as the sole basis for treatment or other  patient management decisions.  A negative result may occur with  improper specimen collection / handling, submission of specimen other  than nasopharyngeal swab, presence of viral mutation(s) within the  areas targeted by this assay, and inadequate number of viral copies  (<250 copies / mL). A negative result must be combined with clinical  observations, patient history, and epidemiological information. If result is POSITIVE SARS-CoV-2 target nucleic acids are DETECTED.  The SARS-CoV-2 RNA is generally detectable in upper and lower  respiratory specimens during the acute phase of infection.  Positive  results are indicative of active infection with SARS-CoV-2.  Clinical  correlation with patient history and other  diagnostic information is  necessary to determine patient infection status.  Positive results do  not rule out bacterial infection or co-infection with other viruses. If result is PRESUMPTIVE POSTIVE SARS-CoV-2 nucleic acids MAY BE PRESENT.   A presumptive positive result was obtained on the submitted specimen  and confirmed on repeat testing.  While 2019 novel coronavirus  (SARS-CoV-2) nucleic acids may be present in the submitted sample  additional confirmatory testing may be necessary for epidemiological  and / or clinical management purposes  to differentiate between  SARS-CoV-2 and other Sarbecovirus currently known to infect humans.  If clinically indicated additional testing with an alternate test  methodology [phone])  is advised. The SARS-CoV-2 RNA is generally  detectable  in upper and lower respiratory specimens during the acute  phase of infection. The expected result is Negative. Fact Sheet for Patients:  StrictlyIdeas.no Fact Sheet for Healthcare Providers: BankingDealers.co.za This test is not yet approved or cleared by the [place] FDA and has been authorized for detection and/or diagnosis of SARS-CoV-2 by FDA under an Emergency Use Authorization (EUA).  This EUA will remain in effect (meaning this test can be used) for the duration of the COVID-19 declaration under Section 564(b)(1) of the Act, 21 U.S.C. section 360bbb-3(b)(1), unless the authorization is terminated or revoked sooner. Performed at [facility], [place] [address], [place], [place] [address]   Culture, blood (Routine X 2) w Reflex to ID Panel     Status: None   Collection Time: 06/08/19  4:20 AM   Specimen: BLOOD RIGHT ARM  Result Value Ref Range Status   Specimen Description BLOOD RIGHT ARM  Final   Special Requests   Final    BOTTLES DRAWN AEROBIC AND ANAEROBIC Blood Culture adequate volume   Culture   Final    NO GROWTH 5 DAYS Performed at [facility], [place] [address], [place], [place] [address]    Report Status 06/13/2019 FINAL  Final  Culture, blood (Routine X 2) w Reflex to ID Panel     Status: None   Collection Time: 06/08/19  4:34 AM   Specimen: BLOOD LEFT ARM  Result Value Ref Range Status   Specimen Description BLOOD LEFT ARM  Final   Special Requests   Final    BOTTLES DRAWN AEROBIC ONLY Blood Culture adequate volume   Culture   Final    NO GROWTH 5 DAYS Performed at [facility], [place] [address] [place], [place] [address]    Report Status 06/13/2019 FINAL  Final      Studies: No results found.  Scheduled Meds: . enoxaparin (LOVENOX) injection  40 mg Subcutaneous Q12H  . pantoprazole  40 mg Oral Q1200  . predniSONE  50 mg Oral Q breakfast  . sodium chloride flush  3 mL Intravenous Once   . vitamin C  500 mg Oral Daily  . zinc sulfate  220 mg Oral Daily    Continuous Infusions:    LOS: 4 days     Annita Brod, MD [facility]  To reach me or the doctor on call, go to: [email or web address] Password [facility]  06/13/2019, 12:40 PM

## 2019-06-14 DIAGNOSIS — M5432 Sciatica, left side: Secondary | ICD-10-CM

## 2019-06-14 LAB — D-DIMER, QUANTITATIVE: D-Dimer, Quant: 1.08 ug/mL-FEU — ABNORMAL HIGH (ref 0.00–0.50)

## 2019-06-14 MED FILL — remdesivir 100mg solr: INTRAVENOUS | Qty: 40 | Status: CN

## 2019-06-14 MED FILL — remdesivir 100mg solr: INTRAVENOUS | Qty: 40 | Status: AC

## 2019-06-14 NOTE — Progress Notes (Signed)
PROGRESS NOTE  Kristen Shaffer SWH:[id number] DOB: [birth year] DOA: 06/08/2019 PCP: Nolene Ebbs, MD  HPI/Recap of past 63 hours: 63 year old morbidly obese female with history of hypertension, asthma, depression, GERD, rheumatic fever in her childhood presented on 7/22 to the emergency room with generalized weakness and increased urinary frequency with mild hematuria for 3-4 days progressively getting worse.    No chest pain or shortness of breath.   In the ED she was found to be tachycardic and tachypneic with O2 sat of 96% on room air, afebrile.  UA was suggestive of UTI with mild acute kidney injury and WBC of 10.3.  Chest x-ray negative, but CT noted bilateral infiltrates consistent with pneumonia, especially seen in COVID. Patient placed on telemetry.  COVID-19 was tested given her generalized weakness which was positive.  On day 2 she was noted to desaturate her oxygen in the 80s and required 4 L via nasal cannula.  Given concerns for respiratory decompensation, patient transferred to [facility] due to COVID infection on night of 7/23.  Over the last few days, patient has dramatically improved.  Was able to wean off of oxygen by 7/26.  Her only complaint is of some left buttock pain going down to the side of her leg.  This feels similar to her sciatica.  It does not radiate down her entire leg.  Working well with PT/OT who continue to recommend SNF.  Assessment/Plan: Principal Problem: COVID-19 infection causing acute respiratory failure with hypoxia ([place]): Stable, currently on 2 L nasal cannula.  Have changed her steroids to p.o.  She completed her Remdisivir on 7/28. Has received 1 dose of Actemra.  Inflammatory markers normalized Active Problems: Backside/leg pain: Suspect that she is having some sciatica.  She says that she normally gets this on her right side, but she says she is bent significant amount of time in the chair continuously yesterday.  Advised some stretching and to  try to put pressure or weight more on the right side.    Hypertension: Blood pressure stable    GERD (gastroesophageal reflux disease)    UTI (urinary tract infection): No urine culture drawn.  Polyuria has since ceased.  Completed course of empiric Rocephin    AKI (acute kidney injury) ([place]): Secondary to UTI.  Resolved with IV fluids.  Morbid obesity: Patient meets criteria with BMI greater than 40  Code Status: Full code  Family Communication: Updated daughter by phone  Disposition Plan: Likely discharge hopefully this week. PT recommending SNF.  Daughter agrees.  Pt would need negative test or facility willing to take her with a positive test.   Consultants:  None   Procedures:  None   Antimicrobials:  IV rocephin 7/22-7/24  DVT prophylaxis: Lovenox   Objective: Vitals:   06/14/19 0841 06/14/19 0843  BP: 132/83   Pulse: 86   Resp:    Temp:  97.6 F (36.4 C)  SpO2: 97%     Intake/Output Summary (Last 24 hours) at 06/14/2019 1257 Last data filed at 06/14/2019 1246 Gross per 24 hour  Intake 840 ml  Output 1 ml  Net 839 ml   Filed Weights   06/08/19 0025 06/08/19 0608  Weight: 136.1 kg (!) 139.1 kg   Body mass index is 43.99 kg/m.  Exam:   General: Alert and oriented x3, no acute distress  HEENT: Normocephalic and atraumatic, mucous membranes are moist  Neck: Thick, narrow airway  Cardiovascular: Regular rate and rhythm, S1-S2  Respiratory: Decreased breath sounds throughout  secondary to body habitus, otherwise clear  Abdomen: Soft, nontender, nondistended, positive bowel sounds  Musculoskeletal: No clubbing or cyanosis, trace pitting edema.  Examined area of left buttock and hip.  No focal points of tenderness, only subjective.  Skin: No skin breaks, tears or lesions  Neuro: No focal deficits  Psychiatry: Appropriate, no evidence of psychoses   Data Reviewed: CBC: Recent Labs  Lab 06/08/19 0028 06/08/19 1225 06/09/19 0429  06/11/19 0045  WBC 10.3 8.9 7.5 10.9*  HGB 15.7* 14.2 13.8 12.7  HCT 48.7* 44.6 44.0 40.6  MCV 81.3 81.1 80.9 82.4  PLT 398 405* 448* 628*   Basic Metabolic Panel: Recent Labs  Lab 06/08/19 0028 06/08/19 1225 06/09/19 0429 06/11/19 0045  NA 139 140 138 139  K 3.5 3.2* 4.8 3.8  CL 101 105 105 107  CO2 24 24 23 23   GLUCOSE 130* 122* 166* 146*  BUN 20 21 20 23   CREATININE 1.22* 1.03* 0.95 0.91  CALCIUM 8.9 8.4* 8.4* 8.3*   GFR: Estimated Creatinine Clearance: 95.3 mL/min (by C-G formula based on SCr of 0.91 mg/dL). Liver Function Tests: Recent Labs  Lab 06/08/19 2302 06/09/19 0429 06/11/19 0045  AST 34 41 24  ALT 34 29 27  ALKPHOS 75 71 71  BILITOT 1.3* 2.0* 0.8  PROT 6.6 6.5 6.3*  ALBUMIN 2.4* 2.5* 2.7*   No results for input(s): LIPASE, AMYLASE in the last 168 hours. No results for input(s): AMMONIA in the last 168 hours. Coagulation Profile: No results for input(s): INR, PROTIME in the last 168 hours. Cardiac Enzymes: No results for input(s): CKTOTAL, CKMB, CKMBINDEX, TROPONINI in the last 168 hours. BNP (last 3 results) No results for input(s): PROBNP in the last 8760 hours. HbA1C: No results for input(s): HGBA1C in the last 72 hours. CBG: No results for input(s): GLUCAP in the last 168 hours. Lipid Profile: No results for input(s): CHOL, HDL, LDLCALC, TRIG, CHOLHDL, LDLDIRECT in the last 72 hours. Thyroid Function Tests: No results for input(s): TSH, T4TOTAL, FREET4, T3FREE, THYROIDAB in the last 72 hours. Anemia Panel: Recent Labs    06/13/19 0555  FERRITIN 154   Urine analysis:    Component Value Date/Time   COLORURINE AMBER (A) 06/08/2019 0026   APPEARANCEUR CLOUDY (A) 06/08/2019 0026   LABSPEC 1.025 06/08/2019 0026   PHURINE 6.0 06/08/2019 0026   GLUCOSEU NEGATIVE 06/08/2019 0026   HGBUR LARGE (A) 06/08/2019 0026   BILIRUBINUR MODERATE (A) 06/08/2019 0026   KETONESUR 15 (A) 06/08/2019 0026   PROTEINUR 100 (A) 06/08/2019 0026    UROBILINOGEN 0.2 11/23/2008 1126   NITRITE POSITIVE (A) 06/08/2019 0026   LEUKOCYTESUR LARGE (A) 06/08/2019 0026   Sepsis Labs: @LABRCNTIP (procalcitonin:4,lacticidven:4)  ) Recent Results (from the past 240 hour(s))  SARS Coronavirus 2 (CEPHEID - Performed in [facility] hospital lab), Hosp Order     Status: Abnormal   Collection Time: 06/08/19  4:15 AM   Specimen: Nasopharyngeal Swab  Result Value Ref Range Status   SARS Coronavirus 2 POSITIVE (A) NEGATIVE Final    Comment: RESULT CALLED TO, READ BACK BY AND VERIFIED WITH: [name] 0605 06/08/2019 T. TYSOR (NOTE) If result is NEGATIVE SARS-CoV-2 target nucleic acids are NOT DETECTED. The SARS-CoV-2 RNA is generally detectable in upper and lower  respiratory specimens during the acute phase of infection. The lowest  concentration of SARS-CoV-2 viral copies this assay can detect is 250  copies / mL. A negative result does not preclude SARS-CoV-2 infection  and should not be used  as the sole basis for treatment or other  patient management decisions.  A negative result may occur with  improper specimen collection / handling, submission of specimen other  than nasopharyngeal swab, presence of viral mutation(s) within the  areas targeted by this assay, and inadequate number of viral copies  (<250 copies / mL). A negative result must be combined with clinical  observations, patient history, and epidemiological information. If result is POSITIVE SARS-CoV-2 target nucleic acids are DETECTED.  The SARS-CoV-2 RNA is generally detectable in upper and lower  respiratory specimens during the acute phase of infection.  Positive  results are indicative of active infection with SARS-CoV-2.  Clinical  correlation with patient history and other diagnostic information is  necessary to determine patient infection status.  Positive results do  not rule out bacterial infection or co-infection with other viruses. If result is PRESUMPTIVE POSTIVE  SARS-CoV-2 nucleic acids MAY BE PRESENT.   A presumptive positive result was obtained on the submitted specimen  and confirmed on repeat testing.  While 2019 novel coronavirus  (SARS-CoV-2) nucleic acids may be present in the submitted sample  additional confirmatory testing may be necessary for epidemiological  and / or clinical management purposes  to differentiate between  SARS-CoV-2 and other Sarbecovirus currently known to infect humans.  If clinically indicated additional testing with an alternate test  methodology [id number])  is advised. The SARS-CoV-2 RNA is generally  detectable in upper and lower respiratory specimens during the acute  phase of infection. The expected result is Negative. Fact Sheet for Patients:  StrictlyIdeas.no Fact Sheet for Healthcare Providers: BankingDealers.co.za This test is not yet approved or cleared by the [place] FDA and has been authorized for detection and/or diagnosis of SARS-CoV-2 by FDA under an Emergency Use Authorization (EUA).  This EUA will remain in effect (meaning this test can be used) for the duration of the COVID-19 declaration under Section 564(b)(1) of the Act, 21 U.S.C. section 360bbb-3(b)(1), unless the authorization is terminated or revoked sooner. Performed at [facility], [place] [address] [place], [place] [address]   Culture, blood (Routine X 2) w Reflex to ID Panel     Status: None   Collection Time: 06/08/19  4:20 AM   Specimen: BLOOD RIGHT ARM  Result Value Ref Range Status   Specimen Description BLOOD RIGHT ARM  Final   Special Requests   Final    BOTTLES DRAWN AEROBIC AND ANAEROBIC Blood Culture adequate volume   Culture   Final    NO GROWTH 5 DAYS Performed at [facility], [place] [address], [place], [place] [address]    Report Status 06/13/2019 FINAL  Final  Culture, blood (Routine X 2) w Reflex to ID Panel     Status: None   Collection Time: 06/08/19   4:34 AM   Specimen: BLOOD LEFT ARM  Result Value Ref Range Status   Specimen Description BLOOD LEFT ARM  Final   Special Requests   Final    BOTTLES DRAWN AEROBIC ONLY Blood Culture adequate volume   Culture   Final    NO GROWTH 5 DAYS Performed at [facility], [place] [address], [place],  [address]    Report Status 06/13/2019 FINAL  Final      Studies: No results found.  Scheduled Meds: . enoxaparin (LOVENOX) injection  70 mg Subcutaneous Q24H  . pantoprazole  40 mg Oral Q1200  . predniSONE  50 mg Oral Q breakfast  . sodium chloride  flush  3 mL Intravenous Once  . vitamin C  500 mg Oral Daily  . zinc sulfate  220 mg Oral Daily    Continuous Infusions:    LOS: 5 days     Annita Brod, MD [facility]  To reach me or the doctor on call, go to: [email or web address] Password [facility]  06/14/2019, 12:57 PM

## 2019-06-15 DIAGNOSIS — J9601 Acute respiratory failure with hypoxia: Secondary | ICD-10-CM

## 2019-06-15 LAB — D-DIMER, QUANTITATIVE: D-Dimer, Quant: 1.04 ug/mL-FEU — ABNORMAL HIGH (ref 0.00–0.50)

## 2019-06-15 LAB — NOVEL CORONAVIRUS, NAA (HOSP ORDER, SEND-OUT TO REF LAB; TAT 18-24 HRS): SARS-CoV-2, NAA: DETECTED — AB

## 2019-06-15 MED ORDER — DEXAMETHASONE 6 MG PO TABS
6.0000 mg | ORAL_TABLET | Freq: Every day | ORAL | Status: DC
  Administered 2019-06-15 – 2019-06-16 (×2): 6 mg via ORAL
  Filled 2019-06-15 (×2): qty 1

## 2019-06-15 NOTE — TOC Initial Note (Signed)
Transition of Care [facility]) - Initial/Assessment Note    Patient Details  Name: GERRE RANUM MRN: [id number] Date of Birth: [birth year]  [facility] CM/SW Contact:    Weston Anna, LCSW Phone Number: 06/15/2019, 2:26 PM  Clinical Narrative:                  CSW spoke with patient daughter, Threasa Beards, via phone to discuss discharge planning. CSW informed daughter of bed offer at [facility]- daughter is agreeable to this facility and for insurance authorization. CSW notified [facility] to start authorization.   CSW will continue to update.   Expected Discharge Plan: Skilled Nursing Facility Barriers to Discharge: Insurance Authorization   Patient Goals and CMS Choice   CMS Medicare.gov Compare Post Acute Care list provided to:: Patient Represenative (must comment)(Daughter- melanie)    Expected Discharge Plan and Services Expected Discharge Plan: [place] In-house Referral: Clinical Social Work Discharge Planning Services: NA Post Acute Care Choice: NA Living arrangements for the past 2 months: Single Family Home                 DME Arranged: N/A DME Agency: NA       HH Arranged: NA HH Agency: NA        Prior Living Arrangements/Services Living arrangements for the past 2 months: Single Family Home Lives with:: Self Patient language and need for interpreter reviewed:: Yes Do you feel safe going back to the place where you live?: Yes      Need for Family Participation in Patient Care: Yes (Comment) Care giver support system in place?: Yes (comment)      Activities of Daily Living Home Assistive Devices/Equipment: Walker (specify type) ADL Screening (condition at time of admission) Patient's cognitive ability adequate to safely complete daily activities?: Yes Is the patient deaf or have difficulty hearing?: No Does the patient have difficulty seeing, even when wearing glasses/contacts?: No Does the patient have difficulty  concentrating, remembering, or making decisions?: No Patient able to express need for assistance with ADLs?: Yes Does the patient have difficulty dressing or bathing?: No Independently performs ADLs?: Yes (appropriate for developmental age) Does the patient have difficulty walking or climbing stairs?: Yes Weakness of Legs: Both Weakness of Arms/Hands: None  Permission Sought/Granted                  Emotional Assessment Appearance:: Appears older than stated age, Appears stated age, Appears younger than stated age     Orientation: : Oriented to Self, Oriented to Place, Oriented to  Time, Oriented to Situation   Psych Involvement: No (comment)  Admission diagnosis:  Weakness [R53.1] Renal insufficiency [N28.9] Urinary tract infection without hematuria, site unspecified [N39.0] Acute respiratory failure with hypoxia ([place]) [J96.01] Patient Active Problem List   Diagnosis Date Noted  . Morbid obesity ([place]) 06/11/2019  . Weakness   . Acute respiratory failure with hypoxia ([place]) 06/09/2019  . COVID-19 virus infection 06/08/2019  . GERD (gastroesophageal reflux disease)   . UTI (urinary tract infection)   . AKI (acute kidney injury) ([place])   . Arthritis   . Hypertension   . Depression   . Fibroid    PCP:  Nolene Ebbs, MD Pharmacy:   [facility] [address], [place] [address] [place] [address] Phone: [phone] Fax: [phone]     Social Determinants of Health (SDOH) Interventions    Readmission Risk Interventions No flowsheet data found.

## 2019-06-15 NOTE — Progress Notes (Signed)
Physical Therapy Treatment Patient Details Name: Kristen Shaffer MRN: [id number] DOB: May 21, 1956 Today's Date: 06/15/2019    History of Present Illness Pt adm with weakness, UTI, and Covid positive. Pt also developed hypoxia. PMH - obesity, depression, arthritis, back pain, htn    PT Comments    The patient is improving in mobility. Ambulated  To BR and back, performed self care after toiletting while standing. Continue to recommend SNF to return to [facility].   Follow Up Recommendations  SNF;Supervision/Assistance - 24 hour     Equipment Recommendations  None recommended by PT    Recommendations for Other Services       Precautions / Restrictions Precautions Precautions: Fall    Mobility  Bed Mobility   Bed Mobility: Supine to Sit   Sidelying to sit: Supervision;HOB elevated       General bed mobility comments: no assistance required  Transfers   Equipment used: 4-wheeled walker Transfers: Sit to/from Stand Sit to Stand: Min guard         General transfer comment: no assist needed to stnd from bed using rollator, used rail in BR  Ambulation/Gait Ambulation/Gait assistance: Min guard Gait Distance (Feet): 25 Feet(x 2) Assistive device: 4-wheeled walker   Gait velocity: decr   General Gait Details: gait steady, leans forward over Rw, manage around door frame and turned intiny BR space.   Stairs             Wheelchair Mobility    Modified Rankin (Stroke Patients Only)       Balance     Sitting balance-Leahy Scale: Good     Standing balance support: Single extremity supported;During functional activity Standing balance-Leahy Scale: Good Standing balance comment: self pericare after toileting. stood x 3 for ` 2 minutes each to complete self care.                            Cognition Arousal/Alertness: Awake/alert Behavior During Therapy: WFL for tasks assessed/performed Overall Cognitive Status: Within  Functional Limits for tasks assessed                                        Exercises      General Comments        Pertinent Vitals/Pain Faces Pain Scale: No hurt    Home Living                      Prior Function            PT Goals (current goals can now be found in the care plan section) Progress towards PT goals: Progressing toward goals    Frequency    Min 2X/week      PT Plan Current plan remains appropriate    Co-evaluation              AM-PAC PT "6 Clicks" Mobility   Outcome Measure  Help needed turning from your back to your side while in a flat bed without using bedrails?: None Help needed moving from lying on your back to sitting on the side of a flat bed without using bedrails?: None Help needed moving to and from a bed to a chair (including a wheelchair)?: A Little Help needed standing up from a chair using your arms (e.g., wheelchair or bedside chair)?: A Little Help needed to walk  in hospital room?: A Little Help needed climbing 3-5 steps with a railing? : A Lot 6 Click Score: 19    End of Session   Activity Tolerance: Patient tolerated treatment well Patient left: in chair;with call bell/phone within reach Nurse Communication: Mobility status PT Visit Diagnosis: Other abnormalities of gait and mobility (R26.89);Unsteadiness on feet (R26.81);Muscle weakness (generalized) (M62.81)     Time: 0757-3225 PT Time Calculation (min) (ACUTE ONLY): 42 min  Charges:  $Gait Training: 8-22 mins $Self Care/Home Management: [place]  Office [phone]    Analy, [place] 06/15/2019, 4:19 PM

## 2019-06-15 NOTE — Progress Notes (Signed)
[facility] PROGRESS NOTE    Progress Note  Kristen Shaffer  CVE:[id number] DOB: 1956-06-28 DOA: 06/08/2019 PCP: Nolene Ebbs, MD     Brief Narrative:   Kristen Shaffer is an 63 y.o. female past medical history of essential hypertension, rheumatic fever as a child who presented on 06/08/2019 with generalized weakness and urinary frequency, the patient described hematuria.  4 days prior to admission which was progressively getting worse. The ED she was found to be tachycardic satting 96% on room air, she was mild acute kidney injury with a mild elevated white blood cell count chest x-ray was negative, but CT noted bilateral infiltrates consistent with pneumonia.  Her SARS-CoV-2 test was positive on 06/07/2019, she then she developed oxygen to keep saturations above 90%.  Requirements  Assessment/Plan:   Acute respiratory failure with hypoxia due to   COVID-19 virus infection: On admission she was started on IV steroids, IV Remdesivir (for which she has completed her treatment), and single dose of IV Actemra on 06/08/2019. She is currently on oral steroids, which will change to oral dexamethasone. She is currently satting greater than 92% on room air. Her inflammatory markers are significantly improved compared to admission. Physical therapy evaluated the patient the recommended skilled nursing facility, have consulted social worker for skilled nursing facility placement.  Essential Hypertension: She is on no new antihypertensive medication her blood pressure seems to be fairly controlled.  Leg pain: Consult physical therapy, no alarming symptoms.  Possible UTI: She completed a course of IV empiric antibiotics.  Acute kidney injury: With a baseline creatinine of less than 1, likely prerenal azotemia resolved with IV fluid hydration.  Morbid obesity:   DVT prophylaxis: lovenox Family Communication:daughter Disposition Plan/Barrier to D/C: Hopefully skilled nursing facility  tomorrow. Code Status:     Code Status Orders  (From admission, onward)         Start     Ordered   06/08/19 0745  Full code  Continuous     06/08/19 0744        Code Status History    This patient has a current code status but no historical code status.   Advance Care Planning Activity        IV Access:    Peripheral IV   Procedures and diagnostic studies:   No results found.   Medical Consultants:    None.  Anti-Infectives:   None  Subjective:    Kristen Shaffer she relates she feels better than yesterday.  Objective:    Vitals:   06/14/19 1524 06/14/19 2033 06/15/19 0415 06/15/19 0430  BP:  116/72 (!) 144/105 (!) 144/95  Pulse:  84 79   Resp:  (!) 21 20   Temp: 97.7 F (36.5 C) (!) 97 F (36.1 C) 97.6 F (36.4 C)   TempSrc: Oral Axillary Oral   SpO2:  96% 97%   Weight:      Height:       SpO2: 97 % O2 Flow Rate (L/min): 2 L/min   Intake/Output Summary (Last 24 hours) at 06/15/2019 0809 Last data filed at 06/15/2019 0600 Gross per 24 hour  Intake 1920 ml  Output 5 ml  Net 1915 ml   Filed Weights   06/08/19 0025 06/08/19 0608  Weight: 136.1 kg (!) 139.1 kg    Exam: General exam: In no acute distress. Respiratory system: Good air movement and clear to auscultation. Cardiovascular system: S1 & S2 heard, RRR. No JVD. Gastrointestinal system: Abdomen is nondistended, soft  and nontender.  Central nervous system: Alert and oriented. No focal neurological deficits. Extremities: No pedal edema. Skin: No rashes, lesions or ulcers Psychiatry: Judgement and insight appear normal. Mood & affect appropriate.   Data Reviewed:    Labs: Basic Metabolic Panel: Recent Labs  Lab 06/08/19 1225 06/09/19 0429 06/11/19 0045  NA 140 138 139  K 3.2* 4.8 3.8  CL 105 105 107  CO2 24 23 23   GLUCOSE 122* 166* 146*  BUN 21 20 23   CREATININE 1.03* 0.95 0.91  CALCIUM 8.4* 8.4* 8.3*   GFR Estimated Creatinine Clearance: 95.3 mL/min (by C-G  formula based on SCr of 0.91 mg/dL). Liver Function Tests: Recent Labs  Lab 06/08/19 2302 06/09/19 0429 06/11/19 0045  AST 34 41 24  ALT 34 29 27  ALKPHOS 75 71 71  BILITOT 1.3* 2.0* 0.8  PROT 6.6 6.5 6.3*  ALBUMIN 2.4* 2.5* 2.7*   No results for input(s): LIPASE, AMYLASE in the last 168 hours. No results for input(s): AMMONIA in the last 168 hours. Coagulation profile No results for input(s): INR, PROTIME in the last 168 hours. COVID-19 Labs  Recent Labs    06/13/19 0555 06/14/19 0445 06/15/19 0554  DDIMER 1.15* 1.08* 1.04*  FERRITIN 154  --   --   CRP <0.8  --   --     Lab Results  Component Value Date   SARSCOV2NAA DETECTED (A) 06/13/2019   SARSCOV2NAA POSITIVE (A) 06/08/2019    CBC: Recent Labs  Lab 06/08/19 1225 06/09/19 0429 06/11/19 0045  WBC 8.9 7.5 10.9*  HGB 14.2 13.8 12.7  HCT 44.6 44.0 40.6  MCV 81.1 80.9 82.4  PLT 405* 448* 461*   Cardiac Enzymes: No results for input(s): CKTOTAL, CKMB, CKMBINDEX, TROPONINI in the last 168 hours. BNP (last 3 results) No results for input(s): PROBNP in the last 8760 hours. CBG: No results for input(s): GLUCAP in the last 168 hours. D-Dimer: Recent Labs    06/14/19 0445 06/15/19 0554  DDIMER 1.08* 1.04*   Hgb A1c: No results for input(s): HGBA1C in the last 72 hours. Lipid Profile: No results for input(s): CHOL, HDL, LDLCALC, TRIG, CHOLHDL, LDLDIRECT in the last 72 hours. Thyroid function studies: No results for input(s): TSH, T4TOTAL, T3FREE, THYROIDAB in the last 72 hours.  Invalid input(s): FREET3 Anemia work up: Recent Labs    06/13/19 0555  FERRITIN 154   Sepsis Labs: Recent Labs  Lab 06/08/19 1225 06/09/19 0429 06/11/19 0045  PROCALCITON 0.30  --   --   WBC 8.9 7.5 10.9*   Microbiology Recent Results (from the past 240 hour(s))  SARS Coronavirus 2 (CEPHEID - Performed in [facility] hospital lab), Hosp Order     Status: Abnormal   Collection Time: 06/08/19  4:15 AM   Specimen:  Nasopharyngeal Swab  Result Value Ref Range Status   SARS Coronavirus 2 POSITIVE (A) NEGATIVE Final    Comment: RESULT CALLED TO, READ BACK BY AND VERIFIED WITH: [name] 0605 06/08/2019 T. TYSOR (NOTE) If result is NEGATIVE SARS-CoV-2 target nucleic acids are NOT DETECTED. The SARS-CoV-2 RNA is generally detectable in upper and lower  respiratory specimens during the acute phase of infection. The lowest  concentration of SARS-CoV-2 viral copies this assay can detect is 250  copies / mL. A negative result does not preclude SARS-CoV-2 infection  and should not be used as the sole basis for treatment or other  patient management decisions.  A negative result may occur with  improper specimen collection /  handling, submission of specimen other  than nasopharyngeal swab, presence of viral mutation(s) within the  areas targeted by this assay, and inadequate number of viral copies  (<250 copies / mL). A negative result must be combined with clinical  observations, patient history, and epidemiological information. If result is POSITIVE SARS-CoV-2 target nucleic acids are DETECTED.  The SARS-CoV-2 RNA is generally detectable in upper and lower  respiratory specimens during the acute phase of infection.  Positive  results are indicative of active infection with SARS-CoV-2.  Clinical  correlation with patient history and other diagnostic information is  necessary to determine patient infection status.  Positive results do  not rule out bacterial infection or co-infection with other viruses. If result is PRESUMPTIVE POSTIVE SARS-CoV-2 nucleic acids MAY BE PRESENT.   A presumptive positive result was obtained on the submitted specimen  and confirmed on repeat testing.  While 2019 novel coronavirus  (SARS-CoV-2) nucleic acids may be present in the submitted sample  additional confirmatory testing may be necessary for epidemiological  and / or clinical management purposes  to differentiate  between  SARS-CoV-2 and other Sarbecovirus currently known to infect humans.  If clinically indicated additional testing with an alternate test  methodology [phone])  is advised. The SARS-CoV-2 RNA is generally  detectable in upper and lower respiratory specimens during the acute  phase of infection. The expected result is Negative. Fact Sheet for Patients:  StrictlyIdeas.no Fact Sheet for Healthcare Providers: BankingDealers.co.za This test is not yet approved or cleared by the [place] FDA and has been authorized for detection and/or diagnosis of SARS-CoV-2 by FDA under an Emergency Use Authorization (EUA).  This EUA will remain in effect (meaning this test can be used) for the duration of the COVID-19 declaration under Section 564(b)(1) of the Act, 21 U.S.C. section 360bbb-3(b)(1), unless the authorization is terminated or revoked sooner. Performed at [facility], [place] [address], [place], [place] [address]   Culture, blood (Routine X 2) w Reflex to ID Panel     Status: None   Collection Time: 06/08/19  4:20 AM   Specimen: BLOOD RIGHT ARM  Result Value Ref Range Status   Specimen Description BLOOD RIGHT ARM  Final   Special Requests   Final    BOTTLES DRAWN AEROBIC AND ANAEROBIC Blood Culture adequate volume   Culture   Final    NO GROWTH 5 DAYS Performed at [facility], [place] [address], [place], [place] [address]    Report Status 06/13/2019 FINAL  Final  Culture, blood (Routine X 2) w Reflex to ID Panel     Status: None   Collection Time: 06/08/19  4:34 AM   Specimen: BLOOD LEFT ARM  Result Value Ref Range Status   Specimen Description BLOOD LEFT ARM  Final   Special Requests   Final    BOTTLES DRAWN AEROBIC ONLY Blood Culture adequate volume   Culture   Final    NO GROWTH 5 DAYS Performed at [facility], [place] [address], [place], [place] [address]    Report Status 06/13/2019 FINAL  Final  Novel  Coronavirus, NAA (hospital order; send-out to ref lab)     Status: Abnormal   Collection Time: 06/13/19  8:07 AM   Specimen: Nasopharyngeal Swab; Respiratory  Result Value Ref Range Status   SARS-CoV-2, NAA DETECTED (A) NOT DETECTED Final    Comment: (NOTE)  Client Requested Flag This test was developed and its performance characteristics determined by [employer]. This test has not been FDA cleared or approved. This test has been authorized by FDA under an Emergency Use Authorization (EUA). This test is only authorized for the duration of time the declaration that circumstances exist justifying the authorization of the emergency use of in vitro diagnostic tests for detection of SARS-CoV-2 virus and/or diagnosis of COVID-19 infection under section 564(b)(1) of the Act, 21 U.S.C. 119ERD-4(Y)(8), unless the authorization is terminated or revoked sooner. When diagnostic testing is negative, the possibility of a false negative result should be considered in the context of a patient's recent exposures and the presence of clinical signs and symptoms consistent with COVID-19. An individual without symptoms of COVID-19 and who is not shedding SARS-CoV-2 virus would expect to have a negative (not det ected) result in this assay. Performed At: [facility] [place], [place] [id number] Rush Farmer MD JS:[id number]    [place]  Final    Comment: Performed at [facility] [address] [place], [place] [address]     Medications:    enoxaparin (LOVENOX) injection  70 mg Subcutaneous Q24H   pantoprazole  40 mg Oral Q1200   predniSONE  50 mg Oral Q breakfast   sodium chloride flush  3 mL Intravenous Once   vitamin C  500 mg Oral Daily   zinc sulfate  220 mg Oral Daily   Continuous Infusions:    LOS: 6 days   Charlynne Cousins  [facility]  06/15/2019, 8:09 AM

## 2019-06-16 NOTE — TOC Transition Note (Signed)
Transition of Care [facility]) - CM/SW Discharge Note   Patient Details  Name: Kristen Shaffer MRN: [id number] Date of Birth: Jun 03, 1956  Transition of Care [facility]) CM/SW Contact:  Alberteen Sam, LCSW Phone Number: 06/16/2019, 12:36 PM   Clinical Narrative:     Patient will DC to: [facility] Anticipated DC date: 06/16/2019 Family notified:Melanie Transport HQ:UIQN  Per MD patient ready for DC to [place]. RN, patient, patient's family, and facility notified of DC. Discharge Summary sent to facility. RN given number for report  [phone]  . DC packet on chart. Ambulance transport requested for patient.  CSW signing off.  [place], [place]   Final next level of care: Skilled Nursing Facility Barriers to Discharge: No Barriers Identified   Patient Goals and CMS Choice   CMS Medicare.gov Compare Post Acute Care list provided to:: Patient Represenative (must comment)(daughter Melanie) Choice offered to / list presented to : Adult Children(Melanie)  Discharge Placement PASRR number recieved: 06/14/19            Patient chooses bed at: [facility] Patient to be transferred to facility by: [facility] Name of family member notified: Melanie Patient and family notified of of transfer: 06/16/19  Discharge Plan and Services In-house Referral: Clinical Social Work Discharge Planning Services: NA Post Acute Care Choice: NA          DME Arranged: N/A DME Agency: NA       HH Arranged: NA HH Agency: NA        Social Determinants of Health (SDOH) Interventions     Readmission Risk Interventions No flowsheet data found.

## 2019-06-16 NOTE — Discharge Summary (Signed)
Physician Discharge Summary  Kristen Shaffer NTI:[id number] DOB: 02-20-1956 DOA: 06/08/2019  PCP: Nolene Ebbs, MD  Admit date: 06/08/2019 Discharge date: 06/16/2019  Admitted From: Home Disposition:  SNF  Recommendations for Outpatient Follow-up:  1. Follow up with PCP in 1-2 weeks 2. Please obtain BMP/CBC in one week 3. Please follow up on the following pending results:  Home Health:No Equipment/Devices:None  Discharge Condition:Stable CODE STATUS:Full Diet recommendation: Heart Healthy   Brief/Interim Summary:  63 y.o. female past medical history of essential hypertension, rheumatic fever as a child who presented on 06/08/2019 with generalized weakness and urinary frequency, the patient described hematuria.  4 days prior to admission which was progressively getting worse. The ED she was found to be tachycardic satting 96% on room air, she was mild acute kidney injury with a mild elevated white blood cell count chest x-ray was negative, but CT noted bilateral infiltrates consistent with pneumonia.  Her SARS-CoV-2 test was positive on 06/07/2019, she then she developed oxygen to keep saturations above 90%.   Discharge Diagnoses:  Principal Problem:   Acute respiratory failure with hypoxia (HCC) Active Problems:   Hypertension   GERD (gastroesophageal reflux disease)   UTI (urinary tract infection)   AKI (acute kidney injury) ([place])   COVID-19 virus infection   Weakness   Morbid obesity ([place])  Acute respiratory failure with hypoxia due to COVID-19 viral infection: She was started on IV steroids IV Remdesivir which she completed her treatment in house as she was severely hypoxic on admission. She received 1 dose of IV Actemra, her oxygen requirement improved.  On the day of discharge she was satting 90 to 94% on room air.  Physical therapy evaluated the patient recommended skilled nursing facility.  Essential hypertension: No changes were made.  Leg pain: Physical therapy was  consulted, it was more muscular.  Possible UTI: She completed a course of IV antibiotics in house.  Acute kidney injury: With a baseline creatinine of less than 1 likely prerenal azotemia resolved with IV fluid hydration.  Morbid obesity counseling was done.   Discharge Instructions  Discharge Instructions    Diet - low sodium heart healthy   Complete by: As directed    Increase activity slowly   Complete by: As directed      Allergies as of 06/16/2019   No Known Allergies     Medication List    TAKE these medications   CELEBREX PO Take 1 tablet by mouth daily as needed (pain).   cetirizine 10 MG tablet Commonly known as: ZYRTEC Take 10 mg by mouth daily as needed for allergies.   clobetasol cream 0.05 % Commonly known as: Temovate Apply nightly to affected area x one mos. Then every few days and then stop. Then use prn.   ibuprofen 200 MG tablet Commonly known as: ADVIL Take 600 mg by mouth daily as needed for moderate pain.   oxyCODONE-acetaminophen 5-325 MG tablet Commonly known as: Roxicet Take 1 tablet by mouth every 4 (four) hours as needed for severe pain.   potassium chloride SA 20 MEQ tablet Commonly known as: K-DUR Take 1 tablet (20 mEq total) by mouth daily.       No Known Allergies  Consultations:  None   Procedures/Studies: Ct Chest Wo Contrast  Result Date: 06/08/2019 CLINICAL DATA:  COVID-19 positive, bowel worsening hypoxia, negative chest radiograph, history childhood asthma, hypertension, GERD EXAM: CT CHEST WITHOUT CONTRAST TECHNIQUE: Multidetector CT imaging of the chest was performed following the standard protocol without IV  contrast. Sagittal and coronal MPR images reconstructed from axial data set. COMPARISON:  None. FINDINGS: Cardiovascular: Aorta upper normal caliber with mild atherosclerotic calcification. Heart normal size. No pericardial effusion. Mediastinum/Nodes: Scattered normal and upper normal sized mediastinal lymph  nodes. No definite thoracic adenopathy. Esophagus unremarkable. Base of cervical region normal appearance. Lungs/Pleura: Peribronchial thickening. Scattered hazy pulmonary infiltrates consistent with pneumonia. Bibasilar atelectasis. No pleural effusion, pneumothorax, or focal mass. Upper Abdomen: Multiple low-attenuation hepatic lesions likely cysts, largest 3.3 x 3.0 cm. Remaining visualized upper abdomen unremarkable. Musculoskeletal: No acute osseous findings. IMPRESSION: Scattered BILATERAL hazy pulmonary infiltrates consistent with pneumonia. Peribronchial thickening and scattered subsegmental atelectasis. Probable hepatic cysts up to 3.3 cm greatest size, recommend follow-up sonographic confirmation. Aortic Atherosclerosis (ICD10-I70.0). Electronically Signed   By: Lavonia Dana M.D.   On: 06/08/2019 18:27   Dg Chest Port 1 View  Result Date: 06/08/2019 CLINICAL DATA:  Weakness EXAM: PORTABLE CHEST 1 VIEW COMPARISON:  07/26/2011 FINDINGS: The heart size and mediastinal contours are within normal limits. Both lungs are clear. The visualized skeletal structures are unremarkable. IMPRESSION: No active disease. Electronically Signed   By: Ulyses Jarred M.D.   On: 06/08/2019 04:00     Subjective: No complaints feels great.  Discharge Exam: Vitals:   06/15/19 2159 06/16/19 0420  BP: 122/80 112/87  Pulse: 85 88  Resp: 18 20  Temp: (!) 97.5 F (36.4 C) 98.1 F (36.7 C)  SpO2: 97% 96%   Vitals:   06/15/19 0430 06/15/19 1600 06/15/19 2159 06/16/19 0420  BP: (!) 144/95 102/77 122/80 112/87  Pulse:  92 85 88  Resp:  20 18 20   Temp:  98 F (36.7 C) (!) 97.5 F (36.4 C) 98.1 F (36.7 C)  TempSrc:  Oral Oral Oral  SpO2:  95% 97% 96%  Weight:      Height:        General: Pt is alert, awake, not in acute distress Cardiovascular: RRR, S1/S2 +, no rubs, no gallops Respiratory: CTA bilaterally, no wheezing, no rhonchi Abdominal: Soft, NT, ND, bowel sounds + Extremities: no edema, no  cyanosis    The results of significant diagnostics from this hospitalization (including imaging, microbiology, ancillary and laboratory) are listed below for reference.     Microbiology: Recent Results (from the past 240 hour(s))  SARS Coronavirus 2 (CEPHEID - Performed in [facility] hospital lab), Hosp Order     Status: Abnormal   Collection Time: 06/08/19  4:15 AM   Specimen: Nasopharyngeal Swab  Result Value Ref Range Status   SARS Coronavirus 2 POSITIVE (A) NEGATIVE Final    Comment: RESULT CALLED TO, READ BACK BY AND VERIFIED WITH: [name] 0605 06/08/2019 T. TYSOR (NOTE) If result is NEGATIVE SARS-CoV-2 target nucleic acids are NOT DETECTED. The SARS-CoV-2 RNA is generally detectable in upper and lower  respiratory specimens during the acute phase of infection. The lowest  concentration of SARS-CoV-2 viral copies this assay can detect is 250  copies / mL. A negative result does not preclude SARS-CoV-2 infection  and should not be used as the sole basis for treatment or other  patient management decisions.  A negative result may occur with  improper specimen collection / handling, submission of specimen other  than nasopharyngeal swab, presence of viral mutation(s) within the  areas targeted by this assay, and inadequate number of viral copies  (<250 copies / mL). A negative result must be combined with clinical  observations, patient history, and epidemiological information. If result is POSITIVE SARS-CoV-2  target nucleic acids are DETECTED.  The SARS-CoV-2 RNA is generally detectable in upper and lower  respiratory specimens during the acute phase of infection.  Positive  results are indicative of active infection with SARS-CoV-2.  Clinical  correlation with patient history and other diagnostic information is  necessary to determine patient infection status.  Positive results do  not rule out bacterial infection or co-infection with other viruses. If result is  PRESUMPTIVE POSTIVE SARS-CoV-2 nucleic acids MAY BE PRESENT.   A presumptive positive result was obtained on the submitted specimen  and confirmed on repeat testing.  While 2019 novel coronavirus  (SARS-CoV-2) nucleic acids may be present in the submitted sample  additional confirmatory testing may be necessary for epidemiological  and / or clinical management purposes  to differentiate between  SARS-CoV-2 and other Sarbecovirus currently known to infect humans.  If clinically indicated additional testing with an alternate test  methodology [phone])  is advised. The SARS-CoV-2 RNA is generally  detectable in upper and lower respiratory specimens during the acute  phase of infection. The expected result is Negative. Fact Sheet for Patients:  StrictlyIdeas.no Fact Sheet for Healthcare Providers: BankingDealers.co.za This test is not yet approved or cleared by the [place] FDA and has been authorized for detection and/or diagnosis of SARS-CoV-2 by FDA under an Emergency Use Authorization (EUA).  This EUA will remain in effect (meaning this test can be used) for the duration of the COVID-19 declaration under Section 564(b)(1) of the Act, 21 U.S.C. section 360bbb-3(b)(1), unless the authorization is terminated or revoked sooner. Performed at [facility], [place] [address] [place], [place] [address]   Culture, blood (Routine X 2) w Reflex to ID Panel     Status: None   Collection Time: 06/08/19  4:20 AM   Specimen: BLOOD RIGHT ARM  Result Value Ref Range Status   Specimen Description BLOOD RIGHT ARM  Final   Special Requests   Final    BOTTLES DRAWN AEROBIC AND ANAEROBIC Blood Culture adequate volume   Culture   Final    NO GROWTH 5 DAYS Performed at [facility], [place] [address], [place], [place] [address]    Report Status 06/13/2019 FINAL  Final  Culture, blood (Routine X 2) w Reflex to ID Panel     Status: None    Collection Time: 06/08/19  4:34 AM   Specimen: BLOOD LEFT ARM  Result Value Ref Range Status   Specimen Description BLOOD LEFT ARM  Final   Special Requests   Final    BOTTLES DRAWN AEROBIC ONLY Blood Culture adequate volume   Culture   Final    NO GROWTH 5 DAYS Performed at [facility], [place] [address] [place], [place] [address]    Report Status 06/13/2019 FINAL  Final  Novel Coronavirus, NAA (hospital order; send-out to ref lab)     Status: Abnormal   Collection Time: 06/13/19  8:07 AM   Specimen: Nasopharyngeal Swab; Respiratory  Result Value Ref Range Status   SARS-CoV-2, NAA DETECTED (A) NOT DETECTED Final    Comment: (NOTE)                  Client Requested Flag This test was developed and its performance characteristics determined by [employer]. This test has not been FDA cleared or approved. This test has been authorized by FDA under an Emergency Use Authorization (EUA). This test is only authorized for the duration of time the declaration that circumstances  exist justifying the authorization of the emergency use of in vitro diagnostic tests for detection of SARS-CoV-2 virus and/or diagnosis of COVID-19 infection under section 564(b)(1) of the Act, 21 U.S.C. 431VQM-0(Q)(6), unless the authorization is terminated or revoked sooner. When diagnostic testing is negative, the possibility of a false negative result should be considered in the context of a patient's recent exposures and the presence of clinical signs and symptoms consistent with COVID-19. An individual without symptoms of COVID-19 and who is not shedding SARS-CoV-2 virus would expect to have a negative (not det ected) result in this assay. Performed At: [facility] [place], [place] [id number] Rush Farmer MD IZ:[id number]    [place]  Final    Comment: Performed at [facility] [address], [place], [place] [address]      Labs: BNP (last 3 results) Recent Labs    06/08/19 1227  BNP 53.9   Basic Metabolic Panel: Recent Labs  Lab 06/11/19 0045  NA 139  K 3.8  CL 107  CO2 23  GLUCOSE 146*  BUN 23  CREATININE 0.91  CALCIUM 8.3*   Liver Function Tests: Recent Labs  Lab 06/11/19 0045  AST 24  ALT 27  ALKPHOS 71  BILITOT 0.8  PROT 6.3*  ALBUMIN 2.7*   No results for input(s): LIPASE, AMYLASE in the last 168 hours. No results for input(s): AMMONIA in the last 168 hours. CBC: Recent Labs  Lab 06/11/19 0045  WBC 10.9*  HGB 12.7  HCT 40.6  MCV 82.4  PLT 461*   Cardiac Enzymes: No results for input(s): CKTOTAL, CKMB, CKMBINDEX, TROPONINI in the last 168 hours. BNP: Invalid input(s): POCBNP CBG: No results for input(s): GLUCAP in the last 168 hours. D-Dimer Recent Labs    06/14/19 0445 06/15/19 0554  DDIMER 1.08* 1.04*   Hgb A1c No results for input(s): HGBA1C in the last 72 hours. Lipid Profile No results for input(s): CHOL, HDL, LDLCALC, TRIG, CHOLHDL, LDLDIRECT in the last 72 hours. Thyroid function studies No results for input(s): TSH, T4TOTAL, T3FREE, THYROIDAB in the last 72 hours.  Invalid input(s): FREET3 Anemia work up No results for input(s): VITAMINB12, FOLATE, FERRITIN, TIBC, IRON, RETICCTPCT in the last 72 hours. Urinalysis    Component Value Date/Time   COLORURINE AMBER (A) 06/08/2019 0026   APPEARANCEUR CLOUDY (A) 06/08/2019 0026   LABSPEC 1.025 06/08/2019 0026   PHURINE 6.0 06/08/2019 0026   GLUCOSEU NEGATIVE 06/08/2019 0026   HGBUR LARGE (A) 06/08/2019 0026   BILIRUBINUR MODERATE (A) 06/08/2019 0026   KETONESUR 15 (A) 06/08/2019 0026   PROTEINUR 100 (A) 06/08/2019 0026   UROBILINOGEN 0.2 11/23/2008 1126   NITRITE POSITIVE (A) 06/08/2019 0026   LEUKOCYTESUR LARGE (A) 06/08/2019 0026   Sepsis Labs Invalid input(s): PROCALCITONIN,  WBC,  LACTICIDVEN Microbiology Recent Results (from the past 240 hour(s))  SARS Coronavirus 2 (CEPHEID - Performed  in [facility] hospital lab), Hosp Order     Status: Abnormal   Collection Time: 06/08/19  4:15 AM   Specimen: Nasopharyngeal Swab  Result Value Ref Range Status   SARS Coronavirus 2 POSITIVE (A) NEGATIVE Final    Comment: RESULT CALLED TO, READ BACK BY AND VERIFIED WITH: [name] 0605 06/08/2019 T. TYSOR (NOTE) If result is NEGATIVE SARS-CoV-2 target nucleic acids are NOT DETECTED. The SARS-CoV-2 RNA is generally detectable in upper and lower  respiratory specimens during the acute phase of infection. The lowest  concentration of SARS-CoV-2 viral copies this assay can  detect is 250  copies / mL. A negative result does not preclude SARS-CoV-2 infection  and should not be used as the sole basis for treatment or other  patient management decisions.  A negative result may occur with  improper specimen collection / handling, submission of specimen other  than nasopharyngeal swab, presence of viral mutation(s) within the  areas targeted by this assay, and inadequate number of viral copies  (<250 copies / mL). A negative result must be combined with clinical  observations, patient history, and epidemiological information. If result is POSITIVE SARS-CoV-2 target nucleic acids are DETECTED.  The SARS-CoV-2 RNA is generally detectable in upper and lower  respiratory specimens during the acute phase of infection.  Positive  results are indicative of active infection with SARS-CoV-2.  Clinical  correlation with patient history and other diagnostic information is  necessary to determine patient infection status.  Positive results do  not rule out bacterial infection or co-infection with other viruses. If result is PRESUMPTIVE POSTIVE SARS-CoV-2 nucleic acids MAY BE PRESENT.   A presumptive positive result was obtained on the submitted specimen  and confirmed on repeat testing.  While 2019 novel coronavirus  (SARS-CoV-2) nucleic acids may be present in the submitted sample  additional  confirmatory testing may be necessary for epidemiological  and / or clinical management purposes  to differentiate between  SARS-CoV-2 and other Sarbecovirus currently known to infect humans.  If clinically indicated additional testing with an alternate test  methodology [phone])  is advised. The SARS-CoV-2 RNA is generally  detectable in upper and lower respiratory specimens during the acute  phase of infection. The expected result is Negative. Fact Sheet for Patients:  StrictlyIdeas.no Fact Sheet for Healthcare Providers: BankingDealers.co.za This test is not yet approved or cleared by the [place] FDA and has been authorized for detection and/or diagnosis of SARS-CoV-2 by FDA under an Emergency Use Authorization (EUA).  This EUA will remain in effect (meaning this test can be used) for the duration of the COVID-19 declaration under Section 564(b)(1) of the Act, 21 U.S.C. section 360bbb-3(b)(1), unless the authorization is terminated or revoked sooner. Performed at [facility], [place] [address] [place], [place] [address]   Culture, blood (Routine X 2) w Reflex to ID Panel     Status: None   Collection Time: 06/08/19  4:20 AM   Specimen: BLOOD RIGHT ARM  Result Value Ref Range Status   Specimen Description BLOOD RIGHT ARM  Final   Special Requests   Final    BOTTLES DRAWN AEROBIC AND ANAEROBIC Blood Culture adequate volume   Culture   Final    NO GROWTH 5 DAYS Performed at [facility], [place] [address] [place], [place] [address]    Report Status 06/13/2019 FINAL  Final  Culture, blood (Routine X 2) w Reflex to ID Panel     Status: None   Collection Time: 06/08/19  4:34 AM   Specimen: BLOOD LEFT ARM  Result Value Ref Range Status   Specimen Description BLOOD LEFT ARM  Final   Special Requests   Final    BOTTLES DRAWN AEROBIC ONLY Blood Culture adequate volume   Culture   Final    NO GROWTH 5 DAYS Performed at  [facility], [place] [address], [place], [place] [address]    Report Status 06/13/2019 FINAL  Final  Novel Coronavirus, NAA (hospital order; send-out to ref lab)     Status: Abnormal   Collection Time: 06/13/19  8:07 AM   Specimen: Nasopharyngeal Swab; Respiratory  Result Value Ref Range Status   SARS-CoV-2, NAA DETECTED (A) NOT DETECTED Final    Comment: (NOTE)                  Client Requested Flag This test was developed and its performance characteristics determined by [employer]. This test has not been FDA cleared or approved. This test has been authorized by FDA under an Emergency Use Authorization (EUA). This test is only authorized for the duration of time the declaration that circumstances exist justifying the authorization of the emergency use of in vitro diagnostic tests for detection of SARS-CoV-2 virus and/or diagnosis of COVID-19 infection under section 564(b)(1) of the Act, 21 U.S.C. 449PNP-0(Y)(5), unless the authorization is terminated or revoked sooner. When diagnostic testing is negative, the possibility of a false negative result should be considered in the context of a patient's recent exposures and the presence of clinical signs and symptoms consistent with COVID-19. An individual without symptoms of COVID-19 and who is not shedding SARS-CoV-2 virus would expect to have a negative (not det ected) result in this assay. Performed At: [facility] [place], [place] [id number] Rush Farmer MD VA:[id number]    [place]  Final    Comment: Performed at [place] [address] [place], [place] [address]     Time coordinating discharge: Over 40 minutes  SIGNED:   Charlynne Cousins, MD  [facility] 06/16/2019, 11:54 AM Pager   If 7PM-7AM, please contact night-coverage [email or web address] Password [email or web address]

## 2019-07-13 ENCOUNTER — Other Ambulatory Visit: Payer: Self-pay | Admitting: Internal Medicine

## 2019-07-13 ENCOUNTER — Other Ambulatory Visit: Payer: Self-pay

## 2019-07-13 ENCOUNTER — Other Ambulatory Visit: Payer: Medicare HMO

## 2019-07-13 DIAGNOSIS — R300 Dysuria: Secondary | ICD-10-CM

## 2019-07-13 MED ORDER — CIPROFLOXACIN HCL 500 MG PO TABS: 500.0000 mg | ORAL_TABLET | Freq: Two times a day (BID) | ORAL | 0 refills | Status: DC

## 2019-07-13 NOTE — Addendum Note (Signed)
Addended by: Lurlean Nanny on: 07/13/2019 04:34 PM   Modules accepted: Orders

## 2019-07-15 LAB — URINE CULTURE
MICRO NUMBER:: 814127
SPECIMEN QUALITY:: ADEQUATE

## 2019-07-17 ENCOUNTER — Emergency Department (HOSPITAL_COMMUNITY): Payer: Medicare HMO

## 2019-07-17 ENCOUNTER — Encounter (HOSPITAL_COMMUNITY): Payer: Self-pay | Admitting: Emergency Medicine

## 2019-07-17 ENCOUNTER — Inpatient Hospital Stay (HOSPITAL_COMMUNITY)
Admission: EM | Admit: 2019-07-17 | Discharge: 2019-07-22 | DRG: 602 | Disposition: A | Discharge status: Home Health | Payer: Medicare HMO | Attending: Internal Medicine | Admitting: Internal Medicine

## 2019-07-17 ENCOUNTER — Other Ambulatory Visit: Payer: Self-pay

## 2019-07-17 DIAGNOSIS — Z800 Family history of malignant neoplasm of digestive organs: Secondary | ICD-10-CM

## 2019-07-17 DIAGNOSIS — I10 Essential (primary) hypertension: Secondary | ICD-10-CM | POA: Diagnosis present

## 2019-07-17 DIAGNOSIS — M1990 Unspecified osteoarthritis, unspecified site: Secondary | ICD-10-CM | POA: Diagnosis present

## 2019-07-17 DIAGNOSIS — M7989 Other specified soft tissue disorders: Secondary | ICD-10-CM | POA: Diagnosis not present

## 2019-07-17 DIAGNOSIS — J9601 Acute respiratory failure with hypoxia: Secondary | ICD-10-CM | POA: Diagnosis present

## 2019-07-17 DIAGNOSIS — L03115 Cellulitis of right lower limb: Principal | ICD-10-CM | POA: Diagnosis present

## 2019-07-17 DIAGNOSIS — U071 COVID-19: Secondary | ICD-10-CM | POA: Diagnosis present

## 2019-07-17 DIAGNOSIS — G8929 Other chronic pain: Secondary | ICD-10-CM | POA: Diagnosis present

## 2019-07-17 DIAGNOSIS — I110 Hypertensive heart disease with heart failure: Secondary | ICD-10-CM | POA: Diagnosis present

## 2019-07-17 DIAGNOSIS — M5440 Lumbago with sciatica, unspecified side: Secondary | ICD-10-CM | POA: Diagnosis present

## 2019-07-17 DIAGNOSIS — M549 Dorsalgia, unspecified: Secondary | ICD-10-CM | POA: Diagnosis present

## 2019-07-17 DIAGNOSIS — Z833 Family history of diabetes mellitus: Secondary | ICD-10-CM

## 2019-07-17 DIAGNOSIS — I5033 Acute on chronic diastolic (congestive) heart failure: Secondary | ICD-10-CM | POA: Diagnosis present

## 2019-07-17 DIAGNOSIS — N390 Urinary tract infection, site not specified: Secondary | ICD-10-CM | POA: Diagnosis present

## 2019-07-17 DIAGNOSIS — Z8249 Family history of ischemic heart disease and other diseases of the circulatory system: Secondary | ICD-10-CM

## 2019-07-17 DIAGNOSIS — Z6841 Body Mass Index (BMI) 40.0 and over, adult: Secondary | ICD-10-CM

## 2019-07-17 DIAGNOSIS — L03116 Cellulitis of left lower limb: Secondary | ICD-10-CM | POA: Diagnosis present

## 2019-07-17 DIAGNOSIS — Z23 Encounter for immunization: Secondary | ICD-10-CM

## 2019-07-17 DIAGNOSIS — E876 Hypokalemia: Secondary | ICD-10-CM | POA: Diagnosis present

## 2019-07-17 DIAGNOSIS — I4510 Unspecified right bundle-branch block: Secondary | ICD-10-CM | POA: Diagnosis present

## 2019-07-17 DIAGNOSIS — K219 Gastro-esophageal reflux disease without esophagitis: Secondary | ICD-10-CM | POA: Diagnosis present

## 2019-07-17 LAB — CBC
HCT: 45.2 % (ref 36.0–46.0)
Hemoglobin: 14.4 g/dL (ref 12.0–15.0)
MCH: 27.1 pg (ref 26.0–34.0)
MCHC: 31.9 g/dL (ref 30.0–36.0)
MCV: 85 fL (ref 80.0–100.0)
Platelets: 311 10*3/uL (ref 150–400)
RBC: 5.32 MIL/uL — ABNORMAL HIGH (ref 3.87–5.11)
RDW: 17.9 % — ABNORMAL HIGH (ref 11.5–15.5)
WBC: 11.4 10*3/uL — ABNORMAL HIGH (ref 4.0–10.5)
nRBC: 0 % (ref 0.0–0.2)

## 2019-07-17 MED ORDER — SODIUM CHLORIDE 0.9% FLUSH: 3.0000 mL | Freq: Once | INTRAVENOUS | Status: DC

## 2019-07-17 NOTE — ED Triage Notes (Signed)
Pt c/o redness, rash and edema to bilat lower extremities. shob not any worse than normal. COVID+ 30d ago.

## 2019-07-18 ENCOUNTER — Other Ambulatory Visit: Payer: Self-pay

## 2019-07-18 ENCOUNTER — Emergency Department (HOSPITAL_COMMUNITY): Payer: Medicare HMO

## 2019-07-18 ENCOUNTER — Inpatient Hospital Stay (HOSPITAL_COMMUNITY): Payer: Medicare HMO

## 2019-07-18 ENCOUNTER — Encounter (HOSPITAL_COMMUNITY): Payer: Self-pay | Admitting: Internal Medicine

## 2019-07-18 DIAGNOSIS — R609 Edema, unspecified: Secondary | ICD-10-CM | POA: Diagnosis not present

## 2019-07-18 DIAGNOSIS — I110 Hypertensive heart disease with heart failure: Secondary | ICD-10-CM | POA: Diagnosis present

## 2019-07-18 DIAGNOSIS — M549 Dorsalgia, unspecified: Secondary | ICD-10-CM | POA: Diagnosis present

## 2019-07-18 DIAGNOSIS — Z8249 Family history of ischemic heart disease and other diseases of the circulatory system: Secondary | ICD-10-CM | POA: Diagnosis not present

## 2019-07-18 DIAGNOSIS — L03119 Cellulitis of unspecified part of limb: Secondary | ICD-10-CM

## 2019-07-18 DIAGNOSIS — Z23 Encounter for immunization: Secondary | ICD-10-CM | POA: Diagnosis present

## 2019-07-18 DIAGNOSIS — I4510 Unspecified right bundle-branch block: Secondary | ICD-10-CM | POA: Diagnosis present

## 2019-07-18 DIAGNOSIS — U071 COVID-19: Secondary | ICD-10-CM | POA: Diagnosis present

## 2019-07-18 DIAGNOSIS — I5033 Acute on chronic diastolic (congestive) heart failure: Secondary | ICD-10-CM | POA: Diagnosis present

## 2019-07-18 DIAGNOSIS — N3001 Acute cystitis with hematuria: Secondary | ICD-10-CM

## 2019-07-18 DIAGNOSIS — G8929 Other chronic pain: Secondary | ICD-10-CM | POA: Diagnosis present

## 2019-07-18 DIAGNOSIS — I5022 Chronic systolic (congestive) heart failure: Secondary | ICD-10-CM

## 2019-07-18 DIAGNOSIS — L03115 Cellulitis of right lower limb: Secondary | ICD-10-CM | POA: Diagnosis present

## 2019-07-18 DIAGNOSIS — E876 Hypokalemia: Secondary | ICD-10-CM | POA: Diagnosis present

## 2019-07-18 DIAGNOSIS — L03116 Cellulitis of left lower limb: Secondary | ICD-10-CM | POA: Diagnosis present

## 2019-07-18 DIAGNOSIS — Z6841 Body Mass Index (BMI) 40.0 and over, adult: Secondary | ICD-10-CM | POA: Diagnosis not present

## 2019-07-18 DIAGNOSIS — Z833 Family history of diabetes mellitus: Secondary | ICD-10-CM | POA: Diagnosis not present

## 2019-07-18 DIAGNOSIS — M7989 Other specified soft tissue disorders: Secondary | ICD-10-CM | POA: Diagnosis present

## 2019-07-18 DIAGNOSIS — J9601 Acute respiratory failure with hypoxia: Secondary | ICD-10-CM | POA: Diagnosis present

## 2019-07-18 DIAGNOSIS — N390 Urinary tract infection, site not specified: Secondary | ICD-10-CM | POA: Diagnosis present

## 2019-07-18 DIAGNOSIS — K219 Gastro-esophageal reflux disease without esophagitis: Secondary | ICD-10-CM | POA: Diagnosis present

## 2019-07-18 DIAGNOSIS — M1990 Unspecified osteoarthritis, unspecified site: Secondary | ICD-10-CM | POA: Diagnosis present

## 2019-07-18 DIAGNOSIS — I10 Essential (primary) hypertension: Secondary | ICD-10-CM

## 2019-07-18 DIAGNOSIS — Z800 Family history of malignant neoplasm of digestive organs: Secondary | ICD-10-CM | POA: Diagnosis not present

## 2019-07-18 DIAGNOSIS — M5440 Lumbago with sciatica, unspecified side: Secondary | ICD-10-CM | POA: Diagnosis present

## 2019-07-18 LAB — URINALYSIS, ROUTINE W REFLEX MICROSCOPIC
Bacteria, UA: NONE SEEN
Bilirubin Urine: NEGATIVE
Glucose, UA: NEGATIVE mg/dL
Ketones, ur: 5 mg/dL — AB
Nitrite: NEGATIVE
Protein, ur: 100 mg/dL — AB
Specific Gravity, Urine: 1.026 (ref 1.005–1.030)
pH: 5 (ref 5.0–8.0)

## 2019-07-18 LAB — CBC WITH DIFFERENTIAL/PLATELET
Abs Immature Granulocytes: 0.03 10*3/uL (ref 0.00–0.07)
Basophils Absolute: 0.1 10*3/uL (ref 0.0–0.1)
Basophils Relative: 1 %
Eosinophils Absolute: 0.2 10*3/uL (ref 0.0–0.5)
Eosinophils Relative: 2 %
HCT: 45 % (ref 36.0–46.0)
Hemoglobin: 14.2 g/dL (ref 12.0–15.0)
Immature Granulocytes: 0 %
Lymphocytes Relative: 24 %
Lymphs Abs: 2 10*3/uL (ref 0.7–4.0)
MCH: 26.9 pg (ref 26.0–34.0)
MCHC: 31.6 g/dL (ref 30.0–36.0)
MCV: 85.4 fL (ref 80.0–100.0)
Monocytes Absolute: 0.6 10*3/uL (ref 0.1–1.0)
Monocytes Relative: 8 %
Neutro Abs: 5.4 10*3/uL (ref 1.7–7.7)
Neutrophils Relative %: 65 %
Platelets: 282 10*3/uL (ref 150–400)
RBC: 5.27 MIL/uL — ABNORMAL HIGH (ref 3.87–5.11)
RDW: 17.9 % — ABNORMAL HIGH (ref 11.5–15.5)
WBC: 8.3 10*3/uL (ref 4.0–10.5)
nRBC: 0 % (ref 0.0–0.2)

## 2019-07-18 LAB — COMPREHENSIVE METABOLIC PANEL
ALT: 29 U/L (ref 0–44)
AST: 37 U/L (ref 15–41)
Albumin: 3.5 g/dL (ref 3.5–5.0)
Alkaline Phosphatase: 103 U/L (ref 38–126)
Anion gap: 12 (ref 5–15)
BUN: 11 mg/dL (ref 8–23)
CO2: 24 mmol/L (ref 22–32)
Calcium: 9.2 mg/dL (ref 8.9–10.3)
Chloride: 106 mmol/L (ref 98–111)
Creatinine, Ser: 0.76 mg/dL (ref 0.44–1.00)
GFR calc Af Amer: >60 mL/min (ref >60)
GFR calc non Af Amer: >60 mL/min (ref >60)
Glucose, Bld: 131 mg/dL — ABNORMAL HIGH (ref 70–99)
Potassium: 3.3 mmol/L — ABNORMAL LOW (ref 3.5–5.1)
Sodium: 142 mmol/L (ref 135–145)
Total Bilirubin: 1.5 mg/dL — ABNORMAL HIGH (ref 0.3–1.2)
Total Protein: 6.5 g/dL (ref 6.5–8.1)

## 2019-07-18 LAB — TSH: TSH: 1.459 u[IU]/mL (ref 0.350–4.500)

## 2019-07-18 LAB — BASIC METABOLIC PANEL
Anion gap: 13 (ref 5–15)
BUN: 13 mg/dL (ref 8–23)
CO2: 21 mmol/L — ABNORMAL LOW (ref 22–32)
Calcium: 9.3 mg/dL (ref 8.9–10.3)
Chloride: 105 mmol/L (ref 98–111)
Creatinine, Ser: 0.75 mg/dL (ref 0.44–1.00)
GFR calc Af Amer: >60 mL/min (ref >60)
GFR calc non Af Amer: >60 mL/min (ref >60)
Glucose, Bld: 105 mg/dL — ABNORMAL HIGH (ref 70–99)
Potassium: 3.3 mmol/L — ABNORMAL LOW (ref 3.5–5.1)
Sodium: 139 mmol/L (ref 135–145)

## 2019-07-18 LAB — ECHOCARDIOGRAM COMPLETE
Height: 70 in
Weight: 4924.19 oz

## 2019-07-18 LAB — FERRITIN: Ferritin: 94 ng/mL (ref 11–307)

## 2019-07-18 LAB — PROCALCITONIN: Procalcitonin: <0.1 ng/mL

## 2019-07-18 LAB — MAGNESIUM: Magnesium: 2 mg/dL (ref 1.7–2.4)

## 2019-07-18 LAB — TROPONIN I (HIGH SENSITIVITY)
Troponin I (High Sensitivity): 16 ng/L (ref <18)
Troponin I (High Sensitivity): 15 ng/L (ref <18)

## 2019-07-18 LAB — LACTATE DEHYDROGENASE: LDH: 239 U/L — ABNORMAL HIGH (ref 98–192)

## 2019-07-18 LAB — D-DIMER, QUANTITATIVE: D-Dimer, Quant: 1.22 ug/mL-FEU — ABNORMAL HIGH (ref 0.00–0.50)

## 2019-07-18 LAB — C-REACTIVE PROTEIN: CRP: 4.9 mg/dL — ABNORMAL HIGH (ref <1.0)

## 2019-07-18 LAB — BRAIN NATRIURETIC PEPTIDE: B Natriuretic Peptide: 33.5 pg/mL (ref 0.0–100.0)

## 2019-07-18 LAB — SARS CORONAVIRUS 2 BY RT PCR (HOSPITAL ORDER, PERFORMED IN ~~LOC~~ HOSPITAL LAB): SARS Coronavirus 2: POSITIVE — AB

## 2019-07-18 MED ORDER — ONDANSETRON HCL 4 MG/2ML IJ SOLN: 4.0000 mg | Freq: Four times a day (QID) | INTRAMUSCULAR | Status: DC | PRN

## 2019-07-18 MED ORDER — ENOXAPARIN SODIUM 40 MG/0.4ML ~~LOC~~ SOLN
40.0000 mg | Freq: Every day | SUBCUTANEOUS | Status: DC
  Filled 2019-07-18: qty 0.4

## 2019-07-18 MED ORDER — PIPERACILLIN-TAZOBACTAM 3.375 G IVPB: 3.3750 g | Freq: Three times a day (TID) | INTRAVENOUS | Status: DC

## 2019-07-18 MED ORDER — ONDANSETRON HCL 4 MG PO TABS: 4.0000 mg | ORAL_TABLET | Freq: Four times a day (QID) | ORAL | Status: DC | PRN

## 2019-07-18 MED ORDER — VANCOMYCIN HCL 10 G IV SOLR
2000.0000 mg | Freq: Once | INTRAVENOUS | Status: AC
  Administered 2019-07-18: 2000 mg via INTRAVENOUS
  Filled 2019-07-18: qty 2000

## 2019-07-18 MED ORDER — POTASSIUM CHLORIDE CRYS ER 20 MEQ PO TBCR
40.0000 meq | EXTENDED_RELEASE_TABLET | Freq: Once | ORAL | Status: AC
  Administered 2019-07-18: 13:00:00 40 meq via ORAL
  Filled 2019-07-18: qty 2

## 2019-07-18 MED ORDER — HYDRALAZINE HCL 20 MG/ML IJ SOLN: 10.0000 mg | INTRAMUSCULAR | Status: DC | PRN

## 2019-07-18 MED ORDER — SODIUM CHLORIDE 0.9 % IV SOLN
2.0000 g | Freq: Three times a day (TID) | INTRAVENOUS | Status: DC
  Administered 2019-07-18 – 2019-07-19 (×4): 2 g via INTRAVENOUS
  Filled 2019-07-18 (×5): qty 2

## 2019-07-18 MED ORDER — FUROSEMIDE 10 MG/ML IJ SOLN
40.0000 mg | Freq: Once | INTRAMUSCULAR | Status: AC
  Administered 2019-07-18: 40 mg via INTRAVENOUS
  Filled 2019-07-18: qty 4

## 2019-07-18 MED ORDER — POTASSIUM CHLORIDE CRYS ER 20 MEQ PO TBCR
20.0000 meq | EXTENDED_RELEASE_TABLET | Freq: Once | ORAL | Status: AC
  Administered 2019-07-18: 15:00:00 20 meq via ORAL
  Filled 2019-07-18: qty 1

## 2019-07-18 MED ORDER — FUROSEMIDE 10 MG/ML IJ SOLN
20.0000 mg | Freq: Once | INTRAMUSCULAR | Status: AC
  Administered 2019-07-18: 20 mg via INTRAVENOUS
  Filled 2019-07-18: qty 2

## 2019-07-18 MED ORDER — OXYCODONE-ACETAMINOPHEN 5-325 MG PO TABS
1.0000 | ORAL_TABLET | ORAL | Status: DC | PRN
  Administered 2019-07-18 – 2019-07-19 (×6): 1 via ORAL
  Filled 2019-07-18 (×7): qty 1

## 2019-07-18 MED ORDER — POTASSIUM CHLORIDE CRYS ER 20 MEQ PO TBCR
40.0000 meq | EXTENDED_RELEASE_TABLET | Freq: Once | ORAL | Status: AC
  Administered 2019-07-18: 10:00:00 40 meq via ORAL
  Filled 2019-07-18: qty 2

## 2019-07-18 MED ORDER — ALBUTEROL SULFATE HFA 108 (90 BASE) MCG/ACT IN AERS
2.0000 | INHALATION_SPRAY | Freq: Four times a day (QID) | RESPIRATORY_TRACT | Status: DC | PRN
  Filled 2019-07-18: qty 6.7

## 2019-07-18 MED ORDER — PNEUMOCOCCAL VAC POLYVALENT 25 MCG/0.5ML IJ INJ
0.5000 mL | INJECTION | INTRAMUSCULAR | Status: AC
  Administered 2019-07-19: 0.5 mL via INTRAMUSCULAR
  Filled 2019-07-18 (×2): qty 0.5

## 2019-07-18 MED ORDER — ACETAMINOPHEN 325 MG PO TABS
650.0000 mg | ORAL_TABLET | Freq: Four times a day (QID) | ORAL | Status: DC | PRN
  Administered 2019-07-18 – 2019-07-19 (×2): 650 mg via ORAL
  Filled 2019-07-18 (×2): qty 2

## 2019-07-18 MED ORDER — ACETAMINOPHEN 650 MG RE SUPP: 650.0000 mg | Freq: Four times a day (QID) | RECTAL | Status: DC | PRN

## 2019-07-18 MED ORDER — VANCOMYCIN HCL 10 G IV SOLR
1500.0000 mg | Freq: Two times a day (BID) | INTRAVENOUS | Status: DC
  Administered 2019-07-18: 1500 mg via INTRAVENOUS
  Filled 2019-07-18 (×2): qty 1500

## 2019-07-18 NOTE — ED Notes (Signed)
IV Team bedside 

## 2019-07-18 NOTE — ED Notes (Signed)
Pt ambulated using pulse-ox, maintaining 02 sat 93-96%. Pt gait is stable using walker but pt with c/o of pain to legs bilaterally. Swelling and redness to both calves noted.

## 2019-07-18 NOTE — Progress Notes (Signed)
Lower extremity venous has been completed.   Preliminary results in CV Proc.   Abram Sander 07/18/2019 11:56 AM

## 2019-07-18 NOTE — Progress Notes (Signed)
The patient was admitted early this morning after midnight and H&P has been reviewed and I am in current agreement with assessment and plan done by Dr. Gean Birchwood.  Additional changes of the plan of care been made accordingly.  The patient is a morbidly obese female with past medical history significant for but not limited to hypertension, arthritis, asthma, history of herniated disc, history of rheumatic fever in childhood, as well as colonic polyps and other comorbidities who presented with increasing bilateral lower extremity edema that is been going on for the last 2 days with some erythema and pain.  She also has been having chronic shortness of breath and was admitted and discharged on July 30th 2020 after being treated for COVID-19 pneumonia.  Since discharge she has been having persistent dysuria and was treated for UTI at that time.  Denies any productive cough or chest pain but states that she continues to have shortness of breath and for last 2 days she noticed increased swelling in both lower legs and she was advised to be checked for DVT.  She has erythema on both legs worse on the left compared to the right chest x-ray shows cardiomegaly with congestion concerning for fluid overload versus pneumonia.  In the ED she is not hypoxic and saturating close to 100% on room air.  She was started on antibiotics for cellulitis with IV vancomycin and IV Zosyn and we have stopped the Zosyn and started the patient with cefepime to try to avoid nephrotoxicity as she is going to be diuresed as well.  She is given a dose of 20 mg of IV Lasix for possible fluid overload however on examination she still continues to be volume overload so given additional 40 mg today.  BMP was normal but has little value as the patient is morbidly obese.  She tested positive again for COVID-19 disease and she is currently gone to be transferred to [place] for further evaluation and treatment under the care of  Dr. Chauncy Lean.  She will be admitted for the following treated for but not limited to:  Bilateral Lower extremity edema with erythema concerning for cellulitis  -Other differential includes DVT or CHF.   -On empiric antibiotics for now and changed to IV vancomycin and IV cefepime -ER physician had given 1 dose of Lasix.  Given additional 40 mg -UA showed 100 protein and will need to continue to monitor -We will need to check a urine protein creatinine ratio -Check Dopplers.  Possible CHF (unknown type) -Patient was given 1 dose of Lasix of 20 mg in the ED and ordered and additional 40 mg.   -Cardiomegaly seen in the chest x-ray.  BNP is normal.   -May need further dose of Lasix if patient shows signs of fluid overload and will defer to [facility] in AM -Checking Dopplers to r/o Swelling from DVT.   -Obtaining ECHOCARDIOGRAM -Strict I's and O's, daily weights -Continue monitoring for signs and symptoms of volume overload  COVID-19 Test Positive  -presently afebrile.  No different signs of any inflammation from COVID-19 infection.   -Will check ferritin CRP LDH procalcitonin levels.   -Ferritin was 94, LDH was 239, CRP was 4.9, procalcitonin last level was less than 0.10, d-dimer was 1.22 -Based on which will have further recommendations for COVID-19 infection.  Hypertension uncontrolled  -Patient does not seems to be on any anti-hypertensives.  -We will keep patient on PRN IV hydralazine and closely follow blood pressure trends.  UTI with Dysuria -presently on empiric antibiotics as an outpatient with Ciprofloxacin - cultures  Morbid Obesity -Estimated body mass index is 44.6 kg/m as calculated from the following:   Height as of this encounter: 5\' 10"  (1.778 m).   Weight as of this encounter: 141 kg. -Weight Loss and Dietary Counseling given   Hyperbilirubinemia -Mild and T bili is 1.5 Likely reactive -Continue to monitor trend Repeat CMP in  a.m.  Hypokalemia -K+ was 3.3 on admission -Replete with p.o. potassium chloride 40 mEq twice daily x2 doses Plan continue monitor replete as necessary -Repeat CMP in the a.m.  We will continue monitor patient's clinical response intervention reviewed blood work and imaging studies in the a.m.  Further care based on results of Echo and Dopplers pending that are pending

## 2019-07-18 NOTE — Progress Notes (Signed)
  Echocardiogram 2D Echocardiogram has been performed.  Kristen Shaffer 07/18/2019, 4:17 PM

## 2019-07-18 NOTE — ED Notes (Signed)
Lunch Tray Ordered @ 1050. 

## 2019-07-18 NOTE — ED Provider Notes (Signed)
[facility] EMERGENCY DEPARTMENT Provider Note   CSN: [id number] Arrival date & time: 07/17/19  2206     History   Chief Complaint Chief Complaint  Patient presents with  . Leg Swelling    HPI Kristen Shaffer is a 63 y.o. female.     Patient with history of morbid obesity, depression, asthma, rheumatic fever as a child presenting with a 2-day history of bilateral leg pain and leg swelling with redness and erythema.  Denies any recent falls or trauma.  States she was hospitalized in July for coronavirus and a UTI.  States she was discharged to rehab which did not help her.  Denies any increase in her usual shortness of breath.  Denies any history of CHF.  States she is had leg swelling in the past and been checked for blood clots and was negative.  She is not had a redness in the past.  No history of gout.  No fevers, chills, nausea or vomiting.  She states her breathing is at baseline.  Denies any focal weakness, numbness or tingling.  No cough or fever.  No chest pain.  She has not been doing thing for her legs at home.  She does not take diuretics on a regular basis  The history is provided by the patient.    Past Medical History:  Diagnosis Date  . Anemia    history  . Arthritis    Osteoarthritis  . Asthma    Childhood  . Bilateral leg edema   . Colon polyps   . 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 Depression   . Fibroid   . GERD (gastroesophageal reflux disease)   . Herniated disc   . Hypertension    no medications at this time  . Lichen sclerosus   . PONV (postoperative nausea and vomiting)   . Rheumatic fever    in childhood  . Sciatic leg pain   . Vocal cord nodule     Patient Active Problem List   Diagnosis Date Noted  . Morbid obesity ([place]) 06/11/2019  . Weakness   . Acute respiratory failure with hypoxia ([place]) 06/09/2019  . COVID-19 virus infection 06/08/2019  . GERD (gastroesophageal  reflux disease)   . UTI (urinary tract infection)   . AKI (acute kidney injury) ([place])   . Arthritis   . Hypertension   . Depression   . Fibroid     Past Surgical History:  Procedure Laterality Date  . COLONOSCOPY    . DILATATION & CURETTAGE/HYSTEROSCOPY WITH MYOSURE N/A 10/23/2015   Procedure: DILATATION & CURETTAGE/HYSTEROSCOPY WITH MYOSURE;  Surgeon: Anastasio Auerbach, MD;  Location: [facility];  Service: Gynecology;  Laterality: N/A;  . DILATION AND CURETTAGE OF UTERUS    . HYSTEROSCOPY    . MYOMECTOMY  1999  . TONSILLECTOMY       OB History    Gravida  2   Para      Term      Preterm      AB  2   Living  0     SAB      TAB      Ectopic      Multiple      Live Births               Home Medications    Prior to Admission medications   Medication Sig Start Date End Date Taking? Authorizing Provider  Celecoxib (CELEBREX  PO) Take 1 tablet by mouth daily as needed (pain).    [provider]  cetirizine (ZYRTEC) 10 MG tablet Take 10 mg by mouth daily as needed for allergies.    [provider]  ciprofloxacin (CIPRO) 500 MG tablet Take 1 tablet (500 mg total) by mouth 2 (two) times daily. 07/13/19   Jearld Fenton, NP  clobetasol cream (TEMOVATE) 0.05 % Apply nightly to affected area x one mos. Then every few days and then stop. Then use prn. Patient not taking: Reported on 06/08/2019 09/10/15   Fontaine, Belinda Block, MD  ibuprofen (ADVIL,MOTRIN) 200 MG tablet Take 600 mg by mouth daily as needed for moderate pain.    [provider]  oxyCODONE-acetaminophen (ROXICET) 5-325 MG tablet Take 1 tablet by mouth every 4 (four) hours as needed for severe pain. Patient not taking: Reported on 11/06/2015 10/23/15   Fontaine, Belinda Block, MD  potassium chloride SA (K-DUR,KLOR-CON) 20 MEQ tablet Take 1 tablet (20 mEq total) by mouth daily. Patient not taking: Reported on 06/08/2019 10/23/15   Fontaine, Belinda Block, MD    Family History Family History   Problem Relation Age of Onset  . Diabetes Mother   . Hypertension Mother   . Diabetes Father   . Heart disease Sister   . Hypertension Maternal Grandmother   . Diabetes Maternal Grandmother   . Cancer Sister        Colon  . Cancer Brother        Stomach    Social History Social History   Tobacco Use  . Smoking status: Never Smoker  . Smokeless tobacco: Never Used  Substance Use Topics  . Alcohol use: Yes    Alcohol/week: 0.0 standard drinks    Comment: occ  . Drug use: No     Allergies   Patient has no known allergies.   Review of Systems Review of Systems  Constitutional: Negative for activity change, appetite change and fever.  HENT: Negative for congestion and rhinorrhea.   Eyes: Negative for visual disturbance.  Respiratory: Negative for shortness of breath.   Cardiovascular: Positive for leg swelling.  Gastrointestinal: Negative for nausea and vomiting.  Genitourinary: Negative for dysuria and hematuria.  Musculoskeletal: Negative for arthralgias and myalgias.  Skin: Positive for rash and wound.  Neurological: Negative for dizziness, weakness and headaches.   all other systems are negative except as noted in the HPI and PMH.     Physical Exam Updated Vital Signs BP (!) 175/111 (BP Location: Right Arm)   Pulse (!) 119   Temp 98.3 F (36.8 C) (Oral)   Resp 20   Ht 5\' 10"  (1.778 m)   Wt (!) 141 kg   SpO2 100%   BMI 44.60 kg/m   Physical Exam Vitals signs and nursing note reviewed.  Constitutional:      General: She is not in acute distress.    Appearance: She is well-developed. She is obese.  HENT:     Head: Normocephalic and atraumatic.     Mouth/Throat:     Pharynx: No oropharyngeal exudate.  Eyes:     Conjunctiva/sclera: Conjunctivae normal.     Pupils: Pupils are equal, round, and reactive to light.  Neck:     Musculoskeletal: Normal range of motion and neck supple.     Comments: No meningismus. Cardiovascular:     Rate and Rhythm:  Normal rate and regular rhythm.     Heart sounds: Normal heart sounds. No murmur.  Pulmonary:  Effort: Pulmonary effort is normal. No respiratory distress.     Breath sounds: Normal breath sounds.  Abdominal:     Palpations: Abdomen is soft.     Tenderness: There is no abdominal tenderness. There is no guarding or rebound.  Musculoskeletal: Normal range of motion.        General: No tenderness.     Right lower leg: Edema present.     Left lower leg: Edema present.     Comments: +2 edema to knees bilaterally, no asymmetry.  Diffuse erythema of her anterior lateral ankles.  Intact DP pulses to palpation. Calf tenderness bilaterally.  Full range of motion bilateral hips, ankles and knees.  Skin:    General: Skin is warm.     Capillary Refill: Capillary refill takes less than 2 seconds.     Findings: Lesion and rash present.  Neurological:     General: No focal deficit present.     Mental Status: She is alert and oriented to person, place, and time. Mental status is at baseline.     Cranial Nerves: No cranial nerve deficit.     Motor: No abnormal muscle tone.     Coordination: Coordination normal.     Comments:  5/5 strength throughout. CN 2-12 intact.Equal grip strength.   Psychiatric:        Behavior: Behavior normal.      ED Treatments / Results  Labs (all labs ordered are listed, but only abnormal results are displayed) Labs Reviewed  SARS CORONAVIRUS 2 (HOSPITAL ORDER, [place] [facility]) - Abnormal; Notable for the following components:      Result Value   SARS Coronavirus 2 POSITIVE (*)    All other components within normal limits  BASIC METABOLIC PANEL - Abnormal; Notable for the following components:   Potassium 3.3 (*)    CO2 21 (*)    Glucose, Bld 105 (*)    All other components within normal limits  CBC - Abnormal; Notable for the following components:   WBC 11.4 (*)    RBC 5.32 (*)    RDW 17.9 (*)    All other components within normal  limits  D-DIMER, QUANTITATIVE (NOT AT [facility]) - Abnormal; Notable for the following components:   D-Dimer, Quant 1.22 (*)    All other components within normal limits  URINALYSIS, ROUTINE W REFLEX MICROSCOPIC - Abnormal; Notable for the following components:   Color, Urine AMBER (*)    APPearance HAZY (*)    Hgb urine dipstick MODERATE (*)    Ketones, ur 5 (*)    Protein, ur 100 (*)    Leukocytes,Ua MODERATE (*)    All other components within normal limits  URINE CULTURE  MRSA PCR SCREENING  BRAIN NATRIURETIC PEPTIDE  CBC  TSH  MAGNESIUM  C-REACTIVE PROTEIN  COMPREHENSIVE METABOLIC PANEL  CBC WITH DIFFERENTIAL/PLATELET  FERRITIN  LACTATE DEHYDROGENASE  PROCALCITONIN  TROPONIN I (HIGH SENSITIVITY)  TROPONIN I (HIGH SENSITIVITY)    EKG EKG Interpretation  Date/Time:  Sunday July 17 2019 23:39:10 EDT Ventricular Rate:  117 PR Interval:  140 QRS Duration: 122 QT Interval:  338 QTC Calculation: 471 R Axis:   9 Text Interpretation:  Sinus tachycardia Right bundle branch block Abnormal ECG No significant change was found Confirmed by Ezequiel Essex [phone]) on 07/18/2019 3:58:12 AM   Radiology Dg Chest 2 View  Result Date: 07/17/2019 CLINICAL DATA:  Lower extremity edema EXAM: CHEST - 2 VIEW COMPARISON:  June 08, 2019 FINDINGS: Heart size  is enlarged. There is generalized volume overload with possible early pulmonary edema. There appears to be a more focal opacity at the right lung base which may represent atelectasis or pneumonia. The lateral view is somewhat limited by motion and patient body habitus. There is no pneumothorax. IMPRESSION: 1. Cardiomegaly with mild volume overload. 2. Opacity at the right lung base may represent atelectasis or pneumonia. Electronically Signed   By: Constance Holster M.D.   On: 07/17/2019 23:02   Dg Ankle 2 Views Right  Result Date: 07/18/2019 CLINICAL DATA:  Bilateral lower extremity edema. EXAM: RIGHT ANKLE - 2 VIEW COMPARISON:  None.  FINDINGS: There is no evidence of fracture, dislocation, or joint effusion. Prominent Achilles tendon enthesophyte. There is no evidence of arthropathy or other focal bone abnormality. Generalized soft tissue edema. IMPRESSION: Generalized soft tissue edema. No acute osseous abnormality. Electronically Signed   By: Keith Rake M.D.   On: 07/18/2019 02:32   Dg Ankle Complete Left  Result Date: 07/18/2019 CLINICAL DATA:  Bilateral lower extremity edema. EXAM: LEFT ANKLE COMPLETE - 3+ VIEW COMPARISON:  None. FINDINGS: There is no evidence of fracture, dislocation, or joint effusion. There is no evidence of arthropathy or other focal bone abnormality. Achilles tendon enthesophyte. Generalized soft tissue edema. IMPRESSION: Generalized soft tissue edema. No osseous abnormality. Electronically Signed   By: Keith Rake M.D.   On: 07/18/2019 02:31    Procedures Procedures (including critical care time)  Medications Ordered in ED Medications  sodium chloride flush (NS) 0.9 % injection 3 mL (has no administration in time range)  furosemide (LASIX) injection 20 mg (has no administration in time range)     Initial Impression / Assessment and Plan / ED Course  I have reviewed the triage vital signs and the nursing notes.  Pertinent labs & imaging results that were available during my care of the patient were reviewed by me and considered in my medical decision making (see chart for details).       2 days of leg pain and leg swelling with erythema.  States she is not short of breath.  She does show congestion on her chest x-ray and persistent opacity at the right lung base.  No recent echocardiogram in the system  Patient with positive coronavirus test on July 21.  She was discharged from the hospital on July 30  She is currently on Cipro for UTI that was diagnosed on August 27.  She denies any fevers or cough.  Patient appears to have new onset CHF with increased edema on her chest x-ray.   There is no recent echocardiogram in the system.  She does have a history of rheumatic fever as a child. Does have possible component of cellulitis as well given IV antibiotics as well as IV lasix.  Coronavirus testing is still positive likely from the end of July.  She is not hypoxic he denies any new fevers..   Admission for LE cellulitis and swelling d/w Dr. Hal Hope. Will need LE dopplers as well as echocardiogram. Covid+ with admission last month for same. No increased work of breathing or hypoxia.   Kristen Shaffer was evaluated in Emergency Department on 07/18/2019 for the symptoms described in the history of present illness. She was evaluated in the context of the global COVID-19 pandemic, which necessitated consideration that the patient might be at risk for infection with the SARS-CoV-2 virus that causes COVID-19. Institutional protocols and algorithms that pertain to the evaluation of patients at risk for COVID-19  are in a state of rapid change based on information released by regulatory bodies including the CDC and federal and state organizations. These policies and algorithms were followed during the patient's care in the ED.    Final Clinical Impressions(s) / ED Diagnoses   Final diagnoses:  Leg swelling  COVID-19 virus detected    ED Discharge Orders    None       Katalena Malveaux, Annie Main, MD 07/18/19 [phone]

## 2019-07-18 NOTE — Progress Notes (Signed)
Patient on personal cell phone when RN entered room. Patient declined having questions and denied this RN calling family for updates.

## 2019-07-18 NOTE — Progress Notes (Addendum)
Pharmacy Antibiotic Note  NAVLEEN GAYMON is a 63 y.o. female admitted on 07/17/2019 with cellulitis.  Pharmacy has been consulted for Vancomycin/Zosyn dosing. WBC mildly elevated. Renal function good. COVID +.   Plan: Vancomycin 2000 mg IV x 1, then 1500 mg IV q12h >>Estimated AUC: 420 Zosyn 3.375G IV q8h to be infused over 4 hours Trend WBC, temp, renal function  F/U infectious work-up Drug levels as indicated  Height: 5\' 10"  (177.8 cm) Weight: (!) 310 lb 13.6 oz (141 kg) IBW/kg (Calculated) : 68.5  Temp (24hrs), Avg:98.3 F (36.8 C), Min:98.3 F (36.8 C), Max:98.3 F (36.8 C)  Recent Labs  Lab 07/17/19 2337  WBC 11.4*  CREATININE 0.75    Estimated Creatinine Clearance: 109.3 mL/min (by C-G formula based on SCr of 0.75 mg/dL).    No Known Allergies Narda Bonds, PharmD, BCPS Clinical Pharmacist Phone: [phone]

## 2019-07-18 NOTE — H&P (Signed)
History and Physical    Kristen Shaffer [id number] DOB: 1956-05-13 DOA: 07/17/2019  PCP: Nolene Ebbs, MD  Patient coming from: Home.  Chief Complaint: Lower extremity swelling.  HPI: Kristen Shaffer is a 63 y.o. female with history of hypertension, morbid obesity presents to the ER because of increasing bilateral lower extremity edema ongoing for last 2 days with some erythema and pain.  Patient also has been a chronic shortness of breath.  Patient was admitted last month and discharged on June 16, 2019 after being treated for COVID-19 pneumonia.  Patient states since discharge she has been having persistent dysuria at the time was treated for UTI.  Denies any productive cough chest pain.  Has chronic shortness of breath.  Last 2 days noticed increasing swelling of the both lower extremities and her primary care physician advised her to be checked for DVT.  ED Course: On exam patient has bilateral lower extremity edema and erythema extending up to the midcalf.  Chest x-ray shows cardiomegaly with congestion concerning for fluid overload versus pneumonia.  Patient was not hypoxic.  ER physician started patient on antibiotics for cellulitis and also give 1 dose of Lasix for possible fluid overload.  BNP was normal.  Labs otherwise showed leukocytosis of 11.4 potassium 3.3.  COVID-19 test came back positive again.  Review of Systems: As per HPI, rest all negative.   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   hysteroscopy D&C specimen   Depression    Fibroid    GERD (gastroesophageal reflux disease)    Herniated disc    Hypertension    no medications at this time   Lichen sclerosus    PONV (postoperative nausea and vomiting)    Rheumatic fever    in childhood   Sciatic leg pain    Vocal cord  nodule     Past Surgical History:  Procedure Laterality Date   COLONOSCOPY     DILATATION & CURETTAGE/HYSTEROSCOPY WITH MYOSURE N/A 10/23/2015   Procedure: DILATATION & CURETTAGE/HYSTEROSCOPY WITH MYOSURE;  Surgeon: Anastasio Auerbach, MD;  Location: [facility];  Service: Gynecology;  Laterality: N/A;   DILATION AND CURETTAGE OF UTERUS     HYSTEROSCOPY     MYOMECTOMY  1999   TONSILLECTOMY       reports that she has never smoked. She has never used smokeless tobacco. She reports current alcohol use. She reports that she does not use drugs.  No Known Allergies  Family History  Problem Relation Age of Onset   Diabetes Mother    Hypertension Mother    Diabetes Father    Heart disease Sister    Hypertension Maternal Grandmother    Diabetes Maternal Grandmother    Cancer Sister        Colon   Cancer Brother        Stomach    Prior to Admission medications   Medication Sig Start Date End Date Taking? Authorizing Provider  Celecoxib (CELEBREX PO) Take 1 tablet by mouth daily as needed (pain).    [provider]  cetirizine (ZYRTEC) 10 MG tablet Take 10 mg by mouth daily as needed for allergies.    [provider]  ciprofloxacin (CIPRO) 500 MG tablet Take 1 tablet (500 mg total) by mouth  2 (two) times daily. 07/13/19   Jearld Fenton, NP  clobetasol cream (TEMOVATE) 0.05 % Apply nightly to affected area x one mos. Then every few days and then stop. Then use prn. Patient not taking: Reported on 06/08/2019 09/10/15   Fontaine, Belinda Block, MD  ibuprofen (ADVIL,MOTRIN) 200 MG tablet Take 600 mg by mouth daily as needed for moderate pain.    [provider]  oxyCODONE-acetaminophen (ROXICET) 5-325 MG tablet Take 1 tablet by mouth every 4 (four) hours as needed for severe pain. Patient not taking: Reported on 11/06/2015 10/23/15   Fontaine, Belinda Block, MD  potassium chloride SA (K-DUR,KLOR-CON) 20 MEQ tablet Take 1 tablet (20 mEq total) by mouth  daily. Patient not taking: Reported on 06/08/2019 10/23/15   Fontaine, Belinda Block, MD    Physical Exam: Constitutional: Moderately built and nourished. Vitals:   07/17/19 2227 07/17/19 2228 07/18/19 0249 07/18/19 0510  BP: (!) 175/111  (!) 162/98 (!) 168/108  Pulse: (!) 119  (!) 111 (!) 113  Resp: 20  18 20   Temp: 98.3 F (36.8 C)     TempSrc: Oral     SpO2: 100%  100% 99%  Weight:  (!) 141 kg    Height:  5\' 10"  (1.778 m)     Eyes: Anicteric no pallor. ENMT: No discharge from the ears eyes nose or mouth. Neck: No mass felt.  No neck rigidity no JVD appreciated. Respiratory: No rhonchi or crepitations. Cardiovascular: S1-S2 heard. Abdomen: Soft nontender bowel sounds present. Musculoskeletal: Bilateral lower extremity edema present with erythema. Skin: Bilateral lower extremity erythema. Neurologic: Alert awake oriented to time place and person.  Moves all extremities. Psychiatric: Appears normal.  Normal affect.   Labs on Admission: I have personally reviewed following labs and imaging studies  CBC: Recent Labs  Lab 07/17/19 2337  WBC 11.4*  HGB 14.4  HCT 45.2  MCV 85.0  PLT [id number]   Basic Metabolic Panel: Recent Labs  Lab 07/17/19 2337  NA 139  K 3.3*  CL 105  CO2 21*  GLUCOSE 105*  BUN 13  CREATININE 0.75  CALCIUM 9.3   GFR: Estimated Creatinine Clearance: 109.3 mL/min (by C-G formula based on SCr of 0.75 mg/dL). Liver Function Tests: No results for input(s): AST, ALT, ALKPHOS, BILITOT, PROT, ALBUMIN in the last 168 hours. No results for input(s): LIPASE, AMYLASE in the last 168 hours. No results for input(s): AMMONIA in the last 168 hours. Coagulation Profile: No results for input(s): INR, PROTIME in the last 168 hours. Cardiac Enzymes: No results for input(s): CKTOTAL, CKMB, CKMBINDEX, TROPONINI in the last 168 hours. BNP (last 3 results) No results for input(s): PROBNP in the last 8760 hours. HbA1C: No results for input(s): HGBA1C in the last 72  hours. CBG: No results for input(s): GLUCAP in the last 168 hours. Lipid Profile: No results for input(s): CHOL, HDL, LDLCALC, TRIG, CHOLHDL, LDLDIRECT in the last 72 hours. Thyroid Function Tests: No results for input(s): TSH, T4TOTAL, FREET4, T3FREE, THYROIDAB in the last 72 hours. Anemia Panel: No results for input(s): VITAMINB12, FOLATE, FERRITIN, TIBC, IRON, RETICCTPCT in the last 72 hours. Urine analysis:    Component Value Date/Time   COLORURINE AMBER (A) 07/18/2019 0525   APPEARANCEUR HAZY (A) 07/18/2019 0525   LABSPEC 1.026 07/18/2019 0525   PHURINE 5.0 07/18/2019 0525   GLUCOSEU NEGATIVE 07/18/2019 0525   HGBUR MODERATE (A) 07/18/2019 0525   BILIRUBINUR NEGATIVE 07/18/2019 0525   KETONESUR 5 (A) 07/18/2019 0525   PROTEINUR 100 (A)  07/18/2019 0525   UROBILINOGEN 0.2 11/23/2008 1126   NITRITE NEGATIVE 07/18/2019 0525   LEUKOCYTESUR MODERATE (A) 07/18/2019 0525   Sepsis Labs: @LABRCNTIP (procalcitonin:4,lacticidven:4) ) Recent Results (from the past 240 hour(s))  Urine Culture     Status: Abnormal   Collection Time: 07/13/19  4:39 PM   Specimen: Urine  Result Value Ref Range Status   MICRO NUMBER: [id number]  Final   SPECIMEN QUALITY: Adequate  Final   Sample Source URINE  Final   STATUS: FINAL  Final   ISOLATE 1: Escherichia coli (A)  Final    Comment: Greater than 100,000 CFU/mL of Escherichia coli      Susceptibility   Escherichia coli - URINE CULTURE, REFLEX    AMOX/CLAVULANIC <=2 Sensitive     AMPICILLIN 4 Sensitive     AMPICILLIN/SULBACTAM <=2 Sensitive     CEFAZOLIN* <=4 Not Reportable      * For infections other than uncomplicated UTIcaused by E. coli, K. pneumoniae or P. mirabilis:Cefazolin is resistant if MIC > or = 8 mcg/mL.(Distinguishing susceptible versus intermediatefor isolates with MIC < or = 4 mcg/mL requiresadditional testing.)For uncomplicated UTI caused by E. coli,K. pneumoniae or P. mirabilis: Cefazolin issusceptible if MIC <32 mcg/mL and  predictssusceptible to the oral agents cefaclor, cefdinir,cefpodoxime, cefprozil, cefuroxime, cephalexinand loracarbef.    CEFEPIME <=1 Sensitive     CEFTRIAXONE <=1 Sensitive     CIPROFLOXACIN <=0.25 Sensitive     LEVOFLOXACIN <=0.12 Sensitive     ERTAPENEM <=0.5 Sensitive     GENTAMICIN <=1 Sensitive     IMIPENEM <=0.25 Sensitive     NITROFURANTOIN <=16 Sensitive     PIP/TAZO <=4 Sensitive     TOBRAMYCIN <=1 Sensitive     TRIMETH/SULFA* <=20 Sensitive      * For infections other than uncomplicated UTIcaused by E. coli, K. pneumoniae or P. mirabilis:Cefazolin is resistant if MIC > or = 8 mcg/mL.(Distinguishing susceptible versus intermediatefor isolates with MIC < or = 4 mcg/mL requiresadditional testing.)For uncomplicated UTI caused by E. coli,K. pneumoniae or P. mirabilis: Cefazolin issusceptible if MIC <32 mcg/mL and predictssusceptible to the oral agents cefaclor, cefdinir,cefpodoxime, cefprozil, cefuroxime, cephalexinand loracarbef.Legend:S = Susceptible  I = IntermediateR = Resistant  NS = Not susceptible* = Not tested  NR = Not reported**NN = See antimicrobic comments  SARS Coronavirus 2 [facility] order, Performed in [facility] hospital lab) Nasopharyngeal Nasopharyngeal Swab     Status: Abnormal   Collection Time: 07/18/19  2:10 AM   Specimen: Nasopharyngeal Swab  Result Value Ref Range Status   SARS Coronavirus 2 POSITIVE (A) NEGATIVE Final    Comment: RESULT CALLED TO, READ BACK BY AND VERIFIED WITH: B. BAILIFF,RN [id number] 07/18/2019 T. TYSOR (NOTE) If result is NEGATIVE SARS-CoV-2 target nucleic acids are NOT DETECTED. The SARS-CoV-2 RNA is generally detectable in upper and lower  respiratory specimens during the acute phase of infection. The lowest  concentration of SARS-CoV-2 viral copies this assay can detect is 250  copies / mL. A negative result does not preclude SARS-CoV-2 infection  and should not be used as the sole basis for treatment or other  patient management  decisions.  A negative result may occur with  improper specimen collection / handling, submission of specimen other  than nasopharyngeal swab, presence of viral mutation(s) within the  areas targeted by this assay, and inadequate number of viral copies  (<250 copies / mL). A negative result must be combined with clinical  observations, patient history, and epidemiological information. If result is POSITIVE SARS-CoV-2  target nucleic acids are DETECTED.  The SARS-CoV-2 RNA is generally detectable in upper and lower  respiratory specimens during the acute phase of infection.  Positive  results are indicative of active infection with SARS-CoV-2.  Clinical  correlation with patient history and other diagnostic information is  necessary to determine patient infection status.  Positive results do  not rule out bacterial infection or co-infection with other viruses. If result is PRESUMPTIVE POSTIVE SARS-CoV-2 nucleic acids MAY BE PRESENT.   A presumptive positive result was obtained on the submitted specimen  and confirmed on repeat testing.  While 2019 novel coronavirus  (SARS-CoV-2) nucleic acids may be present in the submitted sample  additional confirmatory testing may be necessary for epidemiological  and / or clinical management purposes  to differentiate between  SARS-CoV-2 and other Sarbecovirus currently known to infect humans.  If clinically indicated additional testing with an alternate test  methodology [phone]  is advised. The SARS-CoV-2 RNA is generally  detectable in upper and lower respiratory specimens during the acute  phase of infection. The expected result is Negative. Fact Sheet for Patients:  StrictlyIdeas.no Fact Sheet for Healthcare Providers: BankingDealers.co.za This test is not yet approved or cleared by the [place] FDA and has been authorized for detection and/or diagnosis of SARS-CoV-2 by FDA under an  Emergency Use Authorization (EUA).  This EUA will remain in effect (meaning this test can be used) for the duration of the COVID-19 declaration under Section 564(b)(1) of the Act, 21 U.S.C. section 360bbb-3(b)(1), unless the authorization is terminated or revoked sooner. Performed at [facility], [place] [address], [place], [place] [address]      Radiological Exams on Admission: Dg Chest 2 View  Result Date: 07/17/2019 CLINICAL DATA:  Lower extremity edema EXAM: CHEST - 2 VIEW COMPARISON:  June 08, 2019 FINDINGS: Heart size is enlarged. There is generalized volume overload with possible early pulmonary edema. There appears to be a more focal opacity at the right lung base which may represent atelectasis or pneumonia. The lateral view is somewhat limited by motion and patient body habitus. There is no pneumothorax. IMPRESSION: 1. Cardiomegaly with mild volume overload. 2. Opacity at the right lung base may represent atelectasis or pneumonia. Electronically Signed   By: Constance Holster M.D.   On: 07/17/2019 23:02   Dg Ankle 2 Views Right  Result Date: 07/18/2019 CLINICAL DATA:  Bilateral lower extremity edema. EXAM: RIGHT ANKLE - 2 VIEW COMPARISON:  None. FINDINGS: There is no evidence of fracture, dislocation, or joint effusion. Prominent Achilles tendon enthesophyte. There is no evidence of arthropathy or other focal bone abnormality. Generalized soft tissue edema. IMPRESSION: Generalized soft tissue edema. No acute osseous abnormality. Electronically Signed   By: Keith Rake M.D.   On: 07/18/2019 02:32   Dg Ankle Complete Left  Result Date: 07/18/2019 CLINICAL DATA:  Bilateral lower extremity edema. EXAM: LEFT ANKLE COMPLETE - 3+ VIEW COMPARISON:  None. FINDINGS: There is no evidence of fracture, dislocation, or joint effusion. There is no evidence of arthropathy or other focal bone abnormality. Achilles tendon enthesophyte. Generalized soft tissue edema. IMPRESSION: Generalized  soft tissue edema. No osseous abnormality. Electronically Signed   By: Keith Rake M.D.   On: 07/18/2019 02:31    EKG: Independently reviewed.  Sinus tachycardia with RBBB.  Assessment/Plan Principal Problem:   COVID-19 virus infection Active Problems:   Hypertension   UTI (urinary tract infection)   Acute respiratory failure with hypoxia (HCC)   Morbid obesity ([place])  1. Bilateral lower extremity edema with erythema concerning for cellulitis other differential includes DVT or CHF.  On empiric antibiotics for now ER physician had given 1 dose of Lasix.  Check Dopplers. 2. Possible CHF for which patient was given 1 dose of Lasix.  Cardiomegaly seen in the chest x-ray.  BNP is normal.  May need further dose of Lasix if patient shows signs of fluid overload and if Dopplers are negative.  May need to check 2D echo. 3. COVID-19 test positive -presently afebrile.  No different signs of any inflammation from COVID-19 infection.  Will check ferritin CRP LDH procalcitonin levels.  Based on which will have further recommendations for COVID-19 infection. 4. Hypertension uncontrolled -patient does not seems to be on any anti-hypertensives.  We will keep patient on PRN IV hydralazine and closely follow blood pressure trends. 5. UTI with dysuria -presently on empiric antibiotics follow cultures. 6. Morbid obesity will need counseling.  Given that patient has COVID-19 infection with cellulitis of the lower extremities and possible CHF will need inpatient status.   DVT prophylaxis: Lovenox. Code Status: Full code. Family Communication: Discussed with patient. Disposition Plan: Home. Consults called: None. Admission status: Inpatient.   Rise Patience MD [facility] Pager [phone].  If 7PM-7AM, please contact night-coverage [email or web address] Password [facility]  07/18/2019, 6:56 AM

## 2019-07-18 NOTE — Progress Notes (Signed)
Pharmacy Antibiotic Note  Kristen Shaffer is a 63 y.o. female admitted on 07/17/2019 with cellulitis.  Pharmacy has been consulted for Vancomycin/Zosyn dosing initially and now changing zosyn to cefepime.   Plan: Cefepime 2gm IV Q8H F/u renal fxn, C&S, clinical status  Height: 5\' 10"  (177.8 cm) Weight: (!) 310 lb 13.6 oz (141 kg) IBW/kg (Calculated) : 68.5  Temp (24hrs), Avg:98.3 F (36.8 C), Min:98.3 F (36.8 C), Max:98.3 F (36.8 C)  Recent Labs  Lab 07/17/19 2337  WBC 11.4*  CREATININE 0.75    Estimated Creatinine Clearance: 109.3 mL/min (by C-G formula based on SCr of 0.75 mg/dL).    Salome Arnt, PharmD, BCPS Please see AMION for all pharmacy numbers 07/18/2019 8:42 AM

## 2019-07-18 NOTE — Progress Notes (Signed)
The patient's initial MEWS score was 2 upon admission due to tachycardia. She is now at a 1 after two hours.  MD aware.  Will continue to monitor patient.

## 2019-07-19 LAB — BRAIN NATRIURETIC PEPTIDE: B Natriuretic Peptide: 22.1 pg/mL (ref 0.0–100.0)

## 2019-07-19 LAB — COMPREHENSIVE METABOLIC PANEL
ALT: 27 U/L (ref 0–44)
AST: 30 U/L (ref 15–41)
Albumin: 3.4 g/dL — ABNORMAL LOW (ref 3.5–5.0)
Alkaline Phosphatase: 90 U/L (ref 38–126)
Anion gap: 10 (ref 5–15)
BUN: 14 mg/dL (ref 8–23)
CO2: 24 mmol/L (ref 22–32)
Calcium: 9.2 mg/dL (ref 8.9–10.3)
Chloride: 107 mmol/L (ref 98–111)
Creatinine, Ser: 0.79 mg/dL (ref 0.44–1.00)
GFR calc Af Amer: >60 mL/min (ref >60)
GFR calc non Af Amer: >60 mL/min (ref >60)
Glucose, Bld: 85 mg/dL (ref 70–99)
Potassium: 3.6 mmol/L (ref 3.5–5.1)
Sodium: 141 mmol/L (ref 135–145)
Total Bilirubin: 1.1 mg/dL (ref 0.3–1.2)
Total Protein: 6.6 g/dL (ref 6.5–8.1)

## 2019-07-19 LAB — CBC WITH DIFFERENTIAL/PLATELET
Abs Immature Granulocytes: 0.02 10*3/uL (ref 0.00–0.07)
Basophils Absolute: 0 10*3/uL (ref 0.0–0.1)
Basophils Relative: 1 %
Eosinophils Absolute: 0.2 10*3/uL (ref 0.0–0.5)
Eosinophils Relative: 2 %
HCT: 43.5 % (ref 36.0–46.0)
Hemoglobin: 13.6 g/dL (ref 12.0–15.0)
Immature Granulocytes: 0 %
Lymphocytes Relative: 40 %
Lymphs Abs: 2.8 10*3/uL (ref 0.7–4.0)
MCH: 26.8 pg (ref 26.0–34.0)
MCHC: 31.3 g/dL (ref 30.0–36.0)
MCV: 85.6 fL (ref 80.0–100.0)
Monocytes Absolute: 0.9 10*3/uL (ref 0.1–1.0)
Monocytes Relative: 12 %
Neutro Abs: 3.2 10*3/uL (ref 1.7–7.7)
Neutrophils Relative %: 45 %
Platelets: 298 10*3/uL (ref 150–400)
RBC: 5.08 MIL/uL (ref 3.87–5.11)
RDW: 18.2 % — ABNORMAL HIGH (ref 11.5–15.5)
WBC: 7.1 10*3/uL (ref 4.0–10.5)
nRBC: 0 % (ref 0.0–0.2)

## 2019-07-19 LAB — FERRITIN: Ferritin: 84 ng/mL (ref 11–307)

## 2019-07-19 LAB — MAGNESIUM: Magnesium: 1.9 mg/dL (ref 1.7–2.4)

## 2019-07-19 LAB — D-DIMER, QUANTITATIVE: D-Dimer, Quant: 1.16 ug/mL-FEU — ABNORMAL HIGH (ref 0.00–0.50)

## 2019-07-19 LAB — C-REACTIVE PROTEIN: CRP: 4.9 mg/dL — ABNORMAL HIGH (ref <1.0)

## 2019-07-19 LAB — URINE CULTURE

## 2019-07-19 LAB — LACTATE DEHYDROGENASE: LDH: 187 U/L (ref 98–192)

## 2019-07-19 LAB — PROCALCITONIN: Procalcitonin: <0.1 ng/mL

## 2019-07-19 MED ORDER — DOXYCYCLINE HYCLATE 100 MG PO TABS
100.0000 mg | ORAL_TABLET | Freq: Two times a day (BID) | ORAL | Status: DC
  Administered 2019-07-19 – 2019-07-21 (×6): 100 mg via ORAL
  Filled 2019-07-19 (×11): qty 1

## 2019-07-19 MED ORDER — HYDROMORPHONE HCL 1 MG/ML IJ SOLN
1.0000 mg | Freq: Once | INTRAMUSCULAR | Status: AC | PRN
  Administered 2019-07-20: 1 mg via INTRAVENOUS
  Filled 2019-07-19: qty 1

## 2019-07-19 MED ORDER — ISOSORBIDE MONONITRATE ER 30 MG PO TB24
30.0000 mg | ORAL_TABLET | Freq: Every day | ORAL | Status: DC
  Administered 2019-07-19 – 2019-07-22 (×4): 30 mg via ORAL
  Filled 2019-07-19 (×4): qty 1

## 2019-07-19 MED ORDER — ENOXAPARIN SODIUM 80 MG/0.8ML ~~LOC~~ SOLN
70.0000 mg | SUBCUTANEOUS | Status: DC
  Administered 2019-07-19 – 2019-07-21 (×3): 70 mg via SUBCUTANEOUS
  Filled 2019-07-19 (×3): qty 0.8

## 2019-07-19 MED ORDER — POTASSIUM CHLORIDE 20 MEQ PO PACK
40.0000 meq | PACK | Freq: Once | ORAL | Status: AC
  Administered 2019-07-19: 40 meq via ORAL
  Filled 2019-07-19: qty 2

## 2019-07-19 MED ORDER — FUROSEMIDE 10 MG/ML IJ SOLN
40.0000 mg | Freq: Once | INTRAMUSCULAR | Status: AC
  Administered 2019-07-19: 40 mg via INTRAVENOUS
  Filled 2019-07-19: qty 4

## 2019-07-19 MED ORDER — CARVEDILOL 3.125 MG PO TABS
6.2500 mg | ORAL_TABLET | Freq: Two times a day (BID) | ORAL | Status: DC
  Administered 2019-07-19 – 2019-07-22 (×6): 6.25 mg via ORAL
  Filled 2019-07-19 (×7): qty 2

## 2019-07-19 NOTE — Evaluation (Signed)
Physical Therapy Evaluation Patient Details Name: Kristen Shaffer MRN: [id number] DOB: 03/16/1956 Today's Date: 07/19/2019   History of Present Illness  Kristen Shaffer is a 63 y.o. female with history of hypertension, morbid obesity presents to the ER 8/31  because of increasing bilateral lower extremity edema.Patient was admitted [place] with COVID -19 pneumonia and UTI in july, [place] to [facility] 7/30. Reports persistent dysuria , has a chronic shortness of breath.  Negative for DVT-bilateral legs. Patient remains with positive Covid test..  Clinical Impression  The patient reports having sat on [facility] for some time. Assisted to ambulate in room. Assisted donning Ted stockings. Patient will have difficulty donning, reports back pain and body habitus. OT consulted. Pt. From home after spent time at [facility] and refuses to return. Patient is limited ambulator in her home at baseline. Patient does have steps with no rail. encouraged to have one installed. Pt admitted with above diagnosis. Pt currently with functional limitations due to the deficits listed below (see PT Problem List). Pt will benefit from skilled PT to increase their independence and safety with mobility to allow discharge to the venue listed below.       Follow Up Recommendations No PT follow up    Equipment Recommendations  None recommended by PT    Recommendations for Other Services       Precautions / Restrictions Precautions Precautions: Fall Precaution Comments: fluid restriction, on lasix      Mobility  Bed Mobility               General bed mobility comments: on BSC  Transfers Overall transfer level: Needs assistance Equipment used: (3 wheeled RW)   Sit to Stand: Supervision         General transfer comment: from [facility], performed pericare/undies/pad standing  Ambulation/Gait Ambulation/Gait assistance: Min guard Gait Distance (Feet): 25 Feet Assistive device: (3 wheeled RW) Gait Pattern/deviations: Decreased  step length - right;Decreased step length - left;Step-through pattern;Shuffle;Trunk flexed Gait velocity: decr   General Gait Details: gait steady, leans forward over Rw, manage around foot of bed and turned in small space at recliner, moves slowly.  Stairs            Wheelchair Mobility    Modified Rankin (Stroke Patients Only)       Balance     Sitting balance-Leahy Scale: Good       Standing balance-Leahy Scale: Good Standing balance comment: self pericare after toileting. stood to complete self care.                             Pertinent Vitals/Pain Faces Pain Scale: Hurts even more Pain Location: left lateral lower leg when lotion and TED applied Pain Descriptors / Indicators: Burning;Discomfort;Sore;Tender Pain Intervention(s): Monitored during session    Home Living Family/patient expects to be discharged to:: Private residence Living Arrangements: Alone Available Help at Discharge: Family;Available PRN/intermittently Type of Home: Apartment Home Access: Stairs to enter Entrance Stairs-Rails: None Entrance Stairs-Number of Steps: 3 Home Layout: One level Home Equipment: Environmental consultant - 4 wheels(3 wheeled RW)      Prior Function Level of Independence: Needs assistance   Gait / Transfers Assistance Needed: Modified independent household distances with rollator.  ADL's / Homemaking Assistance Needed: Daughter does grocery shopping         Hand Dominance        Extremity/Trunk Assessment   Upper Extremity Assessment Upper Extremity Assessment: Defer to OT evaluation  Lower Extremity Assessment Lower Extremity Assessment: LLE deficits/detail;RLE deficits/detail RLE Deficits / Details: pitting edema present, feels tightness incalf, WFL for gait LLE Deficits / Details: lower lateral leg redness and slight skin irritation    Cervical / Trunk Assessment Cervical / Trunk Exceptions: Increased body habitus  Communication   Communication:  No difficulties  Cognition   Behavior During Therapy: WFL for tasks assessed/performed Overall Cognitive Status: Within Functional Limits for tasks assessed                                        General Comments      Exercises     Assessment/Plan    PT Assessment Patient needs continued PT services  PT Problem List Decreased strength;Decreased activity tolerance;Decreased balance;Decreased mobility;Obesity;Decreased skin integrity       PT Treatment Interventions DME instruction;Gait training;Functional mobility training;Therapeutic activities;Therapeutic exercise;Balance training;Patient/family education    PT Goals (Current goals can be found in the Care Plan section)  Acute Rehab PT Goals Patient Stated Goal: to go home PT Goal Formulation: With patient Time For Goal Achievement: 08/02/19 Potential to Achieve Goals: Good    Frequency Min 3X/week   Barriers to discharge Decreased caregiver support      Co-evaluation               AM-PAC PT "6 Clicks" Mobility  Outcome Measure Help needed turning from your back to your side while in a flat bed without using bedrails?: None Help needed moving from lying on your back to sitting on the side of a flat bed without using bedrails?: None Help needed moving to and from a bed to a chair (including a wheelchair)?: None Help needed standing up from a chair using your arms (e.g., wheelchair or bedside chair)?: A Little Help needed to walk in hospital room?: A Little Help needed climbing 3-5 steps with a railing? : A Lot 6 Click Score: 20    End of Session   Activity Tolerance: Patient tolerated treatment well Patient left: in chair;with call bell/phone within reach;with nursing/sitter in room Nurse Communication: Mobility status PT Visit Diagnosis: Other abnormalities of gait and mobility (R26.89);Unsteadiness on feet (R26.81);Muscle weakness (generalized) (M62.81)    Time: [id number] PT Time  Calculation (min) (ACUTE ONLY): 48 min   Charges:   PT Evaluation $PT Eval Moderate Complexity: 1 Mod PT Treatments $Gait Training: 8-22 mins $Self Care/Home Management: [place] Pager [phone] Office [phone]  Kristen Shaffer, Kristen Shaffer 07/19/2019, 2:15 PM

## 2019-07-19 NOTE — Progress Notes (Signed)
Night shift telemetry coverage.  The patient was given 1 mg of hydromorphone IVP after she mentioned that percocet 5/325 given at 2100 was not working for burning leg pain, which was very intense at the moment. Will consider adjusting oral medication schedule or dosage if symptoms persist.  Tennis Must, MD

## 2019-07-19 NOTE — Progress Notes (Signed)
PROGRESS NOTE                                                                                                                                                                                                             Patient Demographics:    Kristen Shaffer, is a 63 y.o. female, DOB - Dec 15, 1955, DDU:[id number]  Outpatient Primary MD for the patient is Nolene Ebbs, MD    LOS - 1  Admit date - 07/17/2019    Chief Complaint  Patient presents with   Leg Swelling       Brief Narrative  -    Subjective:    Kristen Shaffer today has, No headache, No chest pain, No abdominal pain - No Nausea, No new weakness tingling or numbness, No Cough - SOB.     Assessment  & Plan :    Principal Problem:   COVID-19 virus infection Active Problems:   Hypertension   UTI (urinary tract infection)   Acute respiratory failure with hypoxia (HCC)   Morbid obesity ([place])   1.   Covid 19 Viral Infection - was diagnosed several weeks ago and this is not an active issue.  No pulmonary problems.   COVID-19 Labs  Recent Labs    07/18/19 0213 07/18/19 0653 07/19/19 0155  DDIMER 1.22*  --  1.16*  FERRITIN  --  94 84  LDH  --  239* 187  CRP  --  4.9* 4.9*    Lab Results  Component Value Date   SARSCOV2NAA POSITIVE (A) 07/18/2019   SARSCOV2NAA DETECTED (A) 06/13/2019   SARSCOV2NAA POSITIVE (A) 06/08/2019     Hepatic Function Latest Ref Rng & Units 07/19/2019 07/18/2019 06/11/2019  Total Protein 6.5 - 8.1 g/dL 6.6 6.5 6.3(L)  Albumin 3.5 - 5.0 g/dL 3.4(L) 3.5 2.7(L)  AST 15 - 41 U/L 30 37 24  ALT 0 - 44 U/L 27 29 27   Alk Phosphatase 38 - 126 U/L 90 103 71  Total Bilirubin 0.3 - 1.2 mg/dL 1.1 1.5(H) 0.8  Bilirubin, Direct 0.0 - 0.2 mg/dL - - -        Component Value Date/Time   BNP 22.1 07/19/2019 0155      2.  Mild bilateral left greater than right lower extremity cellulitis with 2+ edema.  Large element is of edema causing  erythema, she was  never febrile and legs were never warm, she did have mild leukocytosis hence mild cellulitis cannot be completely ruled out.  Will taper off her antibiotics to oral doxycycline.  Stable procalcitonin.  Monitor clinically with diuresis and TED stockings.  3.  Acute on chronic diastolic CHF EF 83%.  Placed on Lasix daily for now.  Will monitor closely.  Will add beta-blocker as well.  4.  Hypertension.  Add beta-blocker, low-dose Imdur and monitor.  5.  Underlying history of depression.  Outpatient psych follow-up.  No acute issues.  6.  Morbid obesity BMI of 44.  Follow with PCP for weight loss.    Condition - Stable  Family Communication  :  None  Code Status :  Full  Diet :    Diet Order            Diet Heart Room service appropriate? Yes; Fluid consistency: Thin; Fluid restriction: 1200 mL Fluid  Diet effective now               Disposition Plan  : Home 1-2 days  Consults  :  None  Procedures  :    TTE   1. The left ventricle has normal systolic function, with an ejection fraction of 55-60%. The cavity size was normal. There is mildly increased left ventricular wall thickness. Left ventricular diastolic Doppler parameters are consistent with impaired  relaxation. No evidence of left ventricular regional wall motion abnormalities.  2. The aortic root is normal in size and structure.  3. The aortic valve is tricuspid. Mild calcification of the aortic valve. No stenosis of the aortic valve.  4. The right ventricle has normal systolic function. The cavity was normal. There is no increase in right ventricular wall thickness.  5. Trivial pericardial effusion is present.  6. No evidence of mitral valve stenosis. No mitral regurgitation.  7. The IVC was poorly visualized. Peak RV-RA gradient 26 mmHg.  8. Technically difficult study with poor acoustic windows.    Lower extremity venous duplex - no DVT.   PUD Prophylaxis :    DVT Prophylaxis  :  Lovenox  added  Lab Results  Component Value Date   PLT 298 07/19/2019    Inpatient Medications  Scheduled Meds:  pneumococcal 23 valent vaccine  0.5 mL Intramuscular Tomorrow-1000   sodium chloride flush  3 mL Intravenous Once   Continuous Infusions:  ceFEPime (MAXIPIME) IV 2 g (07/19/19 0826)   vancomycin Stopped (07/19/19 0000)   PRN Meds:.acetaminophen **OR** acetaminophen, albuterol, hydrALAZINE, ondansetron **OR** ondansetron (ZOFRAN) IV, oxyCODONE-acetaminophen  Antibiotics  :    Anti-infectives (From admission, onward)   Start     Dose/Rate Route Frequency Ordered Stop   07/18/19 2200  vancomycin (VANCOCIN) 1,500 mg in sodium chloride 0.9 % 500 mL IVPB     1,500 mg 250 mL/hr over 120 Minutes Intravenous Every 12 hours 07/18/19 0559     07/18/19 0900  ceFEPIme (MAXIPIME) 2 g in sodium chloride 0.9 % 100 mL IVPB     2 g 200 mL/hr over 30 Minutes Intravenous Every 8 hours 07/18/19 0841     07/18/19 0700  piperacillin-tazobactam (ZOSYN) IVPB 3.375 g  Status:  Discontinued     3.375 g 12.5 mL/hr over 240 Minutes Intravenous Every 8 hours 07/18/19 0658 07/18/19 0834   07/18/19 0330  vancomycin (VANCOCIN) 2,000 mg in sodium chloride 0.9 % 500 mL IVPB     2,000 mg 250 mL/hr over 120 Minutes Intravenous  Once 07/18/19 0329 07/18/19 1012  Time Spent in minutes  30   Lala Lund M.D on 07/19/2019 at 8:58 AM  To page go to [email or web address] - password [facility]  [facility] -  Office  [phone]  See all Orders from today for further details    Objective:   Vitals:   07/19/19 0511 07/19/19 0546 07/19/19 0547 07/19/19 0800  BP: (!) 159/96   (!) 159/95  Pulse: 93 65 69 90  Resp: 20 20 19 19   Temp: 97.9 F (36.6 C)   97.8 F (36.6 C)  TempSrc: Oral   Oral  SpO2: 95% 93% 95% 98%  Weight:      Height:        Wt Readings from Last 3 Encounters:  07/18/19 (!) 139.6 kg  06/08/19 (!) 139.1 kg  10/16/15 (!) 141.2 kg     Intake/Output Summary (Last 24 hours)  at 07/19/2019 0858 Last data filed at 07/19/2019 0600 Gross per 24 hour  Intake 1520.1 ml  Output 650 ml  Net 870.1 ml     Physical Exam  Awake Alert, Oriented X 3, No new F.N deficits, Normal affect [place].AT,PERRAL Supple Neck,No JVD, No cervical lymphadenopathy appriciated.  Symmetrical Chest wall movement, Good air movement bilaterally, CTAB RRR,No Gallops,Rubs or new Murmurs, No Parasternal Heave +ve B.Sounds, Abd Soft, No tenderness, No organomegaly appriciated, No rebound - guarding or rigidity. No Cyanosis, Clubbing, 2+ lower extremity edema with left leg redness around ankle but no warmth   Data Review:    CBC Recent Labs  Lab 07/17/19 2337 07/18/19 0653 07/19/19 0155  WBC 11.4* 8.3 7.1  HGB 14.4 14.2 13.6  HCT 45.2 45.0 43.5  PLT 311 282 298  MCV 85.0 85.4 85.6  MCH 27.1 26.9 26.8  MCHC 31.9 31.6 31.3  RDW 17.9* 17.9* 18.2*  LYMPHSABS  --  2.0 2.8  MONOABS  --  0.6 0.9  EOSABS  --  0.2 0.2  BASOSABS  --  0.1 0.0    Chemistries  Recent Labs  Lab 07/17/19 2337 07/18/19 0653 07/19/19 0155  NA 139 142 141  K 3.3* 3.3* 3.6  CL 105 106 107  CO2 21* 24 24  GLUCOSE 105* 131* 85  BUN 13 11 14   CREATININE 0.75 0.76 0.79  CALCIUM 9.3 9.2 9.2  MG  --  2.0 1.9  AST  --  37 30  ALT  --  29 27  ALKPHOS  --  103 90  BILITOT  --  1.5* 1.1   ------------------------------------------------------------------------------------------------------------------ No results for input(s): CHOL, HDL, LDLCALC, TRIG, CHOLHDL, LDLDIRECT in the last 72 hours.  No results found for: HGBA1C ------------------------------------------------------------------------------------------------------------------ Recent Labs    07/18/19 0654  TSH 1.459    Cardiac Enzymes No results for input(s): CKMB, TROPONINI, MYOGLOBIN in the last 168 hours.  Invalid input(s): CK ------------------------------------------------------------------------------------------------------------------      Component Value Date/Time   BNP 22.1 07/19/2019 0155    Micro Results Recent Results (from the past 240 hour(s))  Urine Culture     Status: Abnormal   Collection Time: 07/13/19  4:39 PM   Specimen: Urine  Result Value Ref Range Status   MICRO NUMBER: [id number]  Final   SPECIMEN QUALITY: Adequate  Final   Sample Source URINE  Final   STATUS: FINAL  Final   ISOLATE 1: Escherichia coli (A)  Final    Comment: Greater than 100,000 CFU/mL of Escherichia coli      Susceptibility   Escherichia coli - URINE CULTURE, REFLEX    AMOX/CLAVULANIC <=  2 Sensitive     AMPICILLIN 4 Sensitive     AMPICILLIN/SULBACTAM <=2 Sensitive     CEFAZOLIN* <=4 Not Reportable      * For infections other than uncomplicated UTIcaused by E. coli, K. pneumoniae or P. mirabilis:Cefazolin is resistant if MIC > or = 8 mcg/mL.(Distinguishing susceptible versus intermediatefor isolates with MIC < or = 4 mcg/mL requiresadditional testing.)For uncomplicated UTI caused by E. coli,K. pneumoniae or P. mirabilis: Cefazolin issusceptible if MIC <32 mcg/mL and predictssusceptible to the oral agents cefaclor, cefdinir,cefpodoxime, cefprozil, cefuroxime, cephalexinand loracarbef.    CEFEPIME <=1 Sensitive     CEFTRIAXONE <=1 Sensitive     CIPROFLOXACIN <=0.25 Sensitive     LEVOFLOXACIN <=0.12 Sensitive     ERTAPENEM <=0.5 Sensitive     GENTAMICIN <=1 Sensitive     IMIPENEM <=0.25 Sensitive     NITROFURANTOIN <=16 Sensitive     PIP/TAZO <=4 Sensitive     TOBRAMYCIN <=1 Sensitive     TRIMETH/SULFA* <=20 Sensitive      * For infections other than uncomplicated UTIcaused by E. coli, K. pneumoniae or P. mirabilis:Cefazolin is resistant if MIC > or = 8 mcg/mL.(Distinguishing susceptible versus intermediatefor isolates with MIC < or = 4 mcg/mL requiresadditional testing.)For uncomplicated UTI caused by E. coli,K. pneumoniae or P. mirabilis: Cefazolin issusceptible if MIC <32 mcg/mL and predictssusceptible to the oral agents cefaclor,  cefdinir,cefpodoxime, cefprozil, cefuroxime, cephalexinand loracarbef.Legend:S = Susceptible  I = IntermediateR = Resistant  NS = Not susceptible* = Not tested  NR = Not reported**NN = See antimicrobic comments  SARS Coronavirus 2 [facility] order, Performed in [facility] hospital lab) Nasopharyngeal Nasopharyngeal Swab     Status: Abnormal   Collection Time: 07/18/19  2:10 AM   Specimen: Nasopharyngeal Swab  Result Value Ref Range Status   SARS Coronavirus 2 POSITIVE (A) NEGATIVE Final    Comment: RESULT CALLED TO, READ BACK BY AND VERIFIED WITH: B. BAILIFF,RN 0331 07/18/2019 T. TYSOR (NOTE) If result is NEGATIVE SARS-CoV-2 target nucleic acids are NOT DETECTED. The SARS-CoV-2 RNA is generally detectable in upper and lower  respiratory specimens during the acute phase of infection. The lowest  concentration of SARS-CoV-2 viral copies this assay can detect is 250  copies / mL. A negative result does not preclude SARS-CoV-2 infection  and should not be used as the sole basis for treatment or other  patient management decisions.  A negative result may occur with  improper specimen collection / handling, submission of specimen other  than nasopharyngeal swab, presence of viral mutation(s) within the  areas targeted by this assay, and inadequate number of viral copies  (<250 copies / mL). A negative result must be combined with clinical  observations, patient history, and epidemiological information. If result is POSITIVE SARS-CoV-2 target nucleic acids are DETECTED.  The SARS-CoV-2 RNA is generally detectable in upper and lower  respiratory specimens during the acute phase of infection.  Positive  results are indicative of active infection with SARS-CoV-2.  Clinical  correlation with patient history and other diagnostic information is  necessary to determine patient infection status.  Positive results do  not rule out bacterial infection or co-infection with other viruses. If result is  PRESUMPTIVE POSTIVE SARS-CoV-2 nucleic acids MAY BE PRESENT.   A presumptive positive result was obtained on the submitted specimen  and confirmed on repeat testing.  While 2019 novel coronavirus  (SARS-CoV-2) nucleic acids may be present in the submitted sample  additional confirmatory testing may be necessary for epidemiological  and /  or clinical management purposes  to differentiate between  SARS-CoV-2 and other Sarbecovirus currently known to infect humans.  If clinically indicated additional testing with an alternate test  methodology [phone])  is advised. The SARS-CoV-2 RNA is generally  detectable in upper and lower respiratory specimens during the acute  phase of infection. The expected result is Negative. Fact Sheet for Patients:  StrictlyIdeas.no Fact Sheet for Healthcare Providers: BankingDealers.co.za This test is not yet approved or cleared by the [place] FDA and has been authorized for detection and/or diagnosis of SARS-CoV-2 by FDA under an Emergency Use Authorization (EUA).  This EUA will remain in effect (meaning this test can be used) for the duration of the COVID-19 declaration under Section 564(b)(1) of the Act, 21 U.S.C. section 360bbb-3(b)(1), unless the authorization is terminated or revoked sooner. Performed at [facility], [place] [address], [place], [place] [address]   Urine Culture     Status: Abnormal   Collection Time: 07/18/19  5:19 AM   Specimen: Urine, Clean Catch  Result Value Ref Range Status   Specimen Description URINE, CLEAN CATCH  Final   Special Requests   Final    NONE Performed at [facility], [place] [address] [place], [place] [address]    Culture MULTIPLE SPECIES PRESENT, SUGGEST RECOLLECTION (A)  Final   Report Status 07/19/2019 FINAL  Final    Radiology Reports Dg Chest 2 View  Result Date: 07/17/2019 CLINICAL DATA:  Lower extremity edema EXAM: CHEST - 2 VIEW  COMPARISON:  June 08, 2019 FINDINGS: Heart size is enlarged. There is generalized volume overload with possible early pulmonary edema. There appears to be a more focal opacity at the right lung base which may represent atelectasis or pneumonia. The lateral view is somewhat limited by motion and patient body habitus. There is no pneumothorax. IMPRESSION: 1. Cardiomegaly with mild volume overload. 2. Opacity at the right lung base may represent atelectasis or pneumonia. Electronically Signed   By: Constance Holster M.D.   On: 07/17/2019 23:02   Dg Ankle 2 Views Right  Result Date: 07/18/2019 CLINICAL DATA:  Bilateral lower extremity edema. EXAM: RIGHT ANKLE - 2 VIEW COMPARISON:  None. FINDINGS: There is no evidence of fracture, dislocation, or joint effusion. Prominent Achilles tendon enthesophyte. There is no evidence of arthropathy or other focal bone abnormality. Generalized soft tissue edema. IMPRESSION: Generalized soft tissue edema. No acute osseous abnormality. Electronically Signed   By: Keith Rake M.D.   On: 07/18/2019 02:32   Dg Ankle Complete Left  Result Date: 07/18/2019 CLINICAL DATA:  Bilateral lower extremity edema. EXAM: LEFT ANKLE COMPLETE - 3+ VIEW COMPARISON:  None. FINDINGS: There is no evidence of fracture, dislocation, or joint effusion. There is no evidence of arthropathy or other focal bone abnormality. Achilles tendon enthesophyte. Generalized soft tissue edema. IMPRESSION: Generalized soft tissue edema. No osseous abnormality. Electronically Signed   By: Keith Rake M.D.   On: 07/18/2019 02:31   Vas [place] Lower Extremity Venous (dvt)  Result Date: 07/18/2019  Lower Venous Study Indications: Edema.  Limitations: Poor ultrasound/tissue interface and body habitus. Comparison Study: no prior Performing Technologist: Abram Sander RVS  Examination Guidelines: A complete evaluation includes B-mode imaging, spectral Doppler, color Doppler, and power Doppler as needed of all  accessible portions of each vessel. Bilateral testing is considered an integral part of a complete examination. Limited examinations for reoccurring indications may be performed as noted.  +---------+---------------+---------+-----------+----------+--------------+  RIGHT     Compressibility Phasicity Spontaneity  Properties Thrombus Aging  +---------+---------------+---------+-----------+----------+--------------+  CFV       Full            Yes       Yes                                    +---------+---------------+---------+-----------+----------+--------------+  SFJ       Full                                                             +---------+---------------+---------+-----------+----------+--------------+  FV Prox   Full                                                             +---------+---------------+---------+-----------+----------+--------------+  FV Mid    Full                                                             +---------+---------------+---------+-----------+----------+--------------+  FV Distal Full                                                             +---------+---------------+---------+-----------+----------+--------------+  PFV       Full                                                             +---------+---------------+---------+-----------+----------+--------------+  POP       Full            Yes       Yes                                    +---------+---------------+---------+-----------+----------+--------------+  PTV       Full                                                             +---------+---------------+---------+-----------+----------+--------------+  PERO      Full                                                             +---------+---------------+---------+-----------+----------+--------------+   +---------+---------------+---------+-----------+----------+--------------+  LEFT      Compressibility Phasicity Spontaneity Properties Thrombus Aging   +---------+---------------+---------+-----------+----------+--------------+  CFV       Full            Yes       Yes                                    +---------+---------------+---------+-----------+----------+--------------+  SFJ       Full                                                             +---------+---------------+---------+-----------+----------+--------------+  FV Prox   Full                                                             +---------+---------------+---------+-----------+----------+--------------+  FV Mid    Full                                                             +---------+---------------+---------+-----------+----------+--------------+  FV Distal Full                                                             +---------+---------------+---------+-----------+----------+--------------+  PFV       Full                                                             +---------+---------------+---------+-----------+----------+--------------+  POP       Full            Yes       Yes                                    +---------+---------------+---------+-----------+----------+--------------+  PTV       Full                                                             +---------+---------------+---------+-----------+----------+--------------+  PERO  Not visualized  +---------+---------------+---------+-----------+----------+--------------+     Summary: Right: There is no evidence of deep vein thrombosis in the lower extremity. No cystic structure found in the popliteal fossa. Left: There is no evidence of deep vein thrombosis in the lower extremity. No cystic structure found in the popliteal fossa.  *See table(s) above for measurements and observations. Electronically signed by Monica Martinez MD on 07/18/2019 at 69:07:22 PM.    Final

## 2019-07-19 NOTE — TOC Initial Note (Signed)
Transition of Care [facility]) - Initial/Assessment Note    Patient Details  Name: Kristen Shaffer MRN: [id number] Date of Birth: 08/14/1956  Transition of Care [facility]) CM/SW Contact:    Purcell Mouton, RN Phone Number: 07/19/2019, 12:25 PM  Clinical Narrative:                 Pt readmitted 8/31 COVID +. PT discharged 7/30 COVID+ and transferred to [facility]. Pt states that she will not go back to a SNF, she will go home. Also she was not sure about HH at this time.    Expected Discharge Plan: Home/Self Care     Patient Goals and CMS Choice Patient states their goals for this hospitalization and ongoing recovery are:: To get better and return home. CMS Medicare.gov Compare Post Acute Care list provided to:: Patient    Expected Discharge Plan and Services Expected Discharge Plan: Home/Self Care   Discharge Planning Services: CM Consult     Expected Discharge Date: 07/22/19                                    Prior Living Arrangements/Services   Lives with:: Self Patient language and need for interpreter reviewed:: No                 Activities of Daily Living Home Assistive Devices/Equipment: Gilford Rile (specify type) ADL Screening (condition at time of admission) Patient's cognitive ability adequate to safely complete daily activities?: No Is the patient deaf or have difficulty hearing?: No Does the patient have difficulty seeing, even when wearing glasses/contacts?: No Does the patient have difficulty concentrating, remembering, or making decisions?: No Patient able to express need for assistance with ADLs?: Yes Does the patient have difficulty dressing or bathing?: No Independently performs ADLs?: Yes (appropriate for developmental age) Does the patient have difficulty walking or climbing stairs?: Yes Weakness of Legs: Both Weakness of Arms/Hands: None  Permission Sought/Granted                  Emotional Assessment Appearance:: Appears stated  age     Orientation: : Oriented to Self, Oriented to Place, Oriented to  Time, Oriented to Situation      Admission diagnosis:  Leg swelling [M79.89] COVID-19 virus detected [U07.1] COVID-19 virus infection [U07.1] Patient Active Problem List   Diagnosis Date Noted  . Leg swelling   . Morbid obesity ([place]) 06/11/2019  . Weakness   . Acute respiratory failure with hypoxia ([place]) 06/09/2019  . COVID-19 virus infection 06/08/2019  . GERD (gastroesophageal reflux disease)   . UTI (urinary tract infection)   . AKI (acute kidney injury) ([place])   . Arthritis   . Hypertension   . Depression   . Fibroid    PCP:  Nolene Ebbs, MD Pharmacy:   [facility] [address], [place] [address] [place] [address] Phone: [phone] Fax: [phone]     Social Determinants of Health (SDOH) Interventions    Readmission Risk Interventions No flowsheet data found.

## 2019-07-19 NOTE — Progress Notes (Signed)
ANTICOAGULATION CONSULT NOTE - Initial Consult  Pharmacy Consult for Lovenox Indication: VTE prophylaxis  No Known Allergies  Patient Measurements: Height: 5\' 10"  (177.8 cm) Weight: (!) 307 lb 12.2 oz (139.6 kg) IBW/kg (Calculated) : 68.5  Vital Signs: Temp: 97.8 F (36.6 C) (09/01 0800) Temp Source: Oral (09/01 0800) BP: 159/95 (09/01 0800) Pulse Rate: 90 (09/01 0800)  Labs: Recent Labs    07/17/19 2337 07/18/19 0213 07/18/19 0510 07/18/19 0653 07/19/19 0155  HGB 14.4  --   --  14.2 13.6  HCT 45.2  --   --  45.0 43.5  PLT 311  --   --  282 298  CREATININE 0.75  --   --  0.76 0.79  TROPONINIHS  --  16 15  --   --     Estimated Creatinine Clearance: 108.7 mL/min (by C-G formula based on SCr of 0.79 mg/dL).   Medical History: Past Medical History:  Diagnosis Date  . Anemia    history  . Arthritis    Osteoarthritis  . Asthma    Childhood  . Bilateral leg edema   . Colon polyps   . 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 Depression   . Fibroid   . GERD (gastroesophageal reflux disease)   . Herniated disc   . Hypertension    no medications at this time  . Lichen sclerosus   . PONV (postoperative nausea and vomiting)   . Rheumatic fever    in childhood  . Sciatic leg pain   . Vocal cord nodule     Medications:  Medications Prior to Admission  Medication Sig Dispense Refill Last Dose  . acetaminophen (TYLENOL) 650 MG CR tablet Take 650 mg by mouth every 8 (eight) hours as needed for pain.   07/17/2019 at Unknown time  . Celecoxib (CELEBREX PO) Take 1 tablet by mouth daily as needed (pain).   07/17/2019 at prn  . ciprofloxacin (CIPRO) 500 MG tablet Take 1 tablet (500 mg total) by mouth 2 (two) times daily. 14 tablet 0 07/17/2019 at Unknown time  . zinc gluconate 50 MG tablet Take 50 mg by mouth daily.   07/17/2019 at Unknown time    Assessment: 38 YOF with COVID-19. Pharmacy consulted to start Lovenox for VTE  prophylaxis. Of note, patient is obese with BMI 44. H/H and Plt wnl. SCr 0.79  Goal of Therapy:  VTE prevention Monitor platelets by anticoagulation protocol: Yes   Plan:  -Start Lovenox 0.5 mg/kg (70 mg) Q 24 hours per COVID protocol  -Monitor CBC and s/s of bleeding.  -Pharmacy to sign off and monitor peripherally   Albertina Parr, PharmD., BCPS Clinical Pharmacist Clinical phone for 07/19/19 until pm: [phone]

## 2019-07-19 NOTE — Progress Notes (Signed)
RN paged on-call for Pt chronic 5-6/10 chronic knee pain.Pt stated pain is unbearable

## 2019-07-20 LAB — COMPREHENSIVE METABOLIC PANEL
ALT: 28 U/L (ref 0–44)
AST: 33 U/L (ref 15–41)
Albumin: 3.4 g/dL — ABNORMAL LOW (ref 3.5–5.0)
Alkaline Phosphatase: 79 U/L (ref 38–126)
Anion gap: 9 (ref 5–15)
BUN: 16 mg/dL (ref 8–23)
CO2: 27 mmol/L (ref 22–32)
Calcium: 9.1 mg/dL (ref 8.9–10.3)
Chloride: 104 mmol/L (ref 98–111)
Creatinine, Ser: 0.73 mg/dL (ref 0.44–1.00)
GFR calc Af Amer: >60 mL/min (ref >60)
GFR calc non Af Amer: >60 mL/min (ref >60)
Glucose, Bld: 85 mg/dL (ref 70–99)
Potassium: 4 mmol/L (ref 3.5–5.1)
Sodium: 140 mmol/L (ref 135–145)
Total Bilirubin: 1.2 mg/dL (ref 0.3–1.2)
Total Protein: 6.4 g/dL — ABNORMAL LOW (ref 6.5–8.1)

## 2019-07-20 LAB — CBC WITH DIFFERENTIAL/PLATELET
Abs Immature Granulocytes: 0.03 10*3/uL (ref 0.00–0.07)
Basophils Absolute: 0 10*3/uL (ref 0.0–0.1)
Basophils Relative: 0 %
Eosinophils Absolute: 0.2 10*3/uL (ref 0.0–0.5)
Eosinophils Relative: 2 %
HCT: 40.4 % (ref 36.0–46.0)
Hemoglobin: 12.6 g/dL (ref 12.0–15.0)
Immature Granulocytes: 0 %
Lymphocytes Relative: 24 %
Lymphs Abs: 2.4 10*3/uL (ref 0.7–4.0)
MCH: 26.9 pg (ref 26.0–34.0)
MCHC: 31.2 g/dL (ref 30.0–36.0)
MCV: 86.1 fL (ref 80.0–100.0)
Monocytes Absolute: 0.9 10*3/uL (ref 0.1–1.0)
Monocytes Relative: 9 %
Neutro Abs: 6.5 10*3/uL (ref 1.7–7.7)
Neutrophils Relative %: 65 %
Platelets: 284 10*3/uL (ref 150–400)
RBC: 4.69 MIL/uL (ref 3.87–5.11)
RDW: 17.6 % — ABNORMAL HIGH (ref 11.5–15.5)
WBC: 10 10*3/uL (ref 4.0–10.5)
nRBC: 0 % (ref 0.0–0.2)

## 2019-07-20 LAB — BRAIN NATRIURETIC PEPTIDE: B Natriuretic Peptide: 20.7 pg/mL (ref 0.0–100.0)

## 2019-07-20 LAB — MAGNESIUM: Magnesium: 2.1 mg/dL (ref 1.7–2.4)

## 2019-07-20 MED ORDER — TRAMADOL HCL 50 MG PO TABS
50.0000 mg | ORAL_TABLET | Freq: Four times a day (QID) | ORAL | Status: DC | PRN
  Administered 2019-07-20 – 2019-07-21 (×5): 50 mg via ORAL
  Filled 2019-07-20 (×5): qty 1

## 2019-07-20 MED ORDER — GABAPENTIN 600 MG PO TABS
300.0000 mg | ORAL_TABLET | Freq: Three times a day (TID) | ORAL | Status: DC
  Filled 2019-07-20 (×2): qty 0.5

## 2019-07-20 MED ORDER — ZOLPIDEM TARTRATE 5 MG PO TABS
5.0000 mg | ORAL_TABLET | Freq: Every evening | ORAL | Status: DC | PRN
  Administered 2019-07-21: 5 mg via ORAL
  Filled 2019-07-20 (×2): qty 1

## 2019-07-20 MED ORDER — FUROSEMIDE 10 MG/ML IJ SOLN
40.0000 mg | Freq: Once | INTRAMUSCULAR | Status: AC
  Administered 2019-07-20: 09:00:00 40 mg via INTRAVENOUS
  Filled 2019-07-20: qty 4

## 2019-07-20 MED ORDER — GABAPENTIN 300 MG PO CAPS
300.0000 mg | ORAL_CAPSULE | Freq: Three times a day (TID) | ORAL | Status: DC
  Administered 2019-07-20 – 2019-07-22 (×7): 300 mg via ORAL
  Filled 2019-07-20 (×7): qty 1

## 2019-07-20 MED ORDER — POTASSIUM CHLORIDE 20 MEQ PO PACK
40.0000 meq | PACK | Freq: Once | ORAL | Status: DC
  Filled 2019-07-20: qty 2

## 2019-07-20 NOTE — Evaluation (Signed)
Occupational Therapy Evaluation Patient Details Name: Kristen Shaffer MRN: [id number] DOB: October 02, 1956 Today's Date: 07/20/2019    History of Present Illness Kristen Shaffer is a 63 y.o. female with history of hypertension, morbid obesity presents to the ER 8/31  because of increasing bilateral lower extremity edema.Patient was admitted [place] with COVID -19 pneumonia and UTI in july, [place] to [facility] 7/30. Reports persistent dysuria , has a chronic shortness of breath.  Negative for DVT-bilateral legs. Patient remains with positive Covid test..   Clinical Impression   Pt recently dc'ed from [place] and went to SNF, then went home, then returned to [place]. Today Pt on RA with Saturations >90% throughout session. Pt min guard for transfers with RW, and Pt able to complete toilet transfer, grooming, and full bath. Pt is requiring max A to don ted hose, and mod A for general LB ADL. She will benefit from skilled OT in the acute setting as well as HHsetting as she is currently declining SNF. She will need HHOT not only to ensure completion and safety with ADL (tub transfer is an issue per Pt report) but also for IADL (cooking in particular). OT will follow acutely and plan on next session for energy conservation, establishing HEP, and standing tasks for grooming.    Follow Up Recommendations  Home health OT;SNF(Pt is declining SNF placement)    Equipment Recommendations  3 in 1 bedside commode    Recommendations for Other Services PT consult     Precautions / Restrictions Precautions Precautions: Fall Precaution Comments: fluid restriction, on lasix Restrictions Weight Bearing Restrictions: No      Mobility Bed Mobility Overal bed mobility: Modified Independent Bed Mobility: Supine to Sit     Supine to sit: Modified independent (Device/Increase time)     General bed mobility comments: use of bed rail to assist, able to manage BLE  Transfers Overall transfer level: Needs assistance Equipment  used: (3 wheeled walker) Transfers: Sit to/from Stand Sit to Stand: Supervision         General transfer comment: from [facility], performed pericare/undies/pad standing    Balance Overall balance assessment: Needs assistance Sitting-balance support: No upper extremity supported;Feet supported Sitting balance-Leahy Scale: Good     Standing balance support: Single extremity supported;Bilateral upper extremity supported;During functional activity Standing balance-Leahy Scale: Fair                             ADL either performed or assessed with clinical judgement   ADL Overall ADL's : Needs assistance/impaired Eating/Feeding: Supervision/ safety;Set up;Sitting   Grooming: Wash/dry face;Min guard;Standing Grooming Details (indicate cue type and reason): Fatigues quickly Upper Body Bathing: Set up;Sitting   Lower Body Bathing: Minimal assistance;Sit to/from stand Lower Body Bathing Details (indicate cue type and reason): assist for back and rear peri care Upper Body Dressing : Min guard;Sitting Upper Body Dressing Details (indicate cue type and reason): to don new gown Lower Body Dressing: Maximal assistance;Sit to/from stand Lower Body Dressing Details (indicate cue type and reason): to don ted hose Toilet Transfer: Min guard;Ambulation(3 wheeled walker) Toilet Transfer Details (indicate cue type and reason): Min Guard A for safety Toileting- Clothing Manipulation and Hygiene: Moderate assistance;Sit to/from stand Toileting - Clothing Manipulation Details (indicate cue type and reason): able to perform front peri care, assit for thoroughness with rear peri care   Tub/Shower Transfer Details (indicate cue type and reason): says that her shower chair will not fit in her tub,  educated to adjust the legs and have one side in the tub and 2 legs out Functional mobility during ADLs: Min guard;Rolling walker General ADL Comments: decreased activity tolerance and poor insight into  deficits Pt "blames her depression"     Vision Baseline Vision/History: Wears glasses Wears Glasses: At all times Patient Visual Report: No change from baseline       Perception     Praxis      Pertinent Vitals/Pain Pain Assessment: 0-10 Pain Score: 5  Pain Location: L knee, back Pain Descriptors / Indicators: Constant;Burning;Sore Pain Intervention(s): Monitored during session;Repositioned     Hand Dominance Right   Extremity/Trunk Assessment Upper Extremity Assessment Upper Extremity Assessment: Generalized weakness       Cervical / Trunk Assessment Cervical / Trunk Assessment: Other exceptions Cervical / Trunk Exceptions: Increased body habitus   Communication Communication Communication: No difficulties   Cognition Arousal/Alertness: Awake/alert Behavior During Therapy: WFL for tasks assessed/performed Overall Cognitive Status: Within Functional Limits for tasks assessed                                     General Comments  VSS throughout session, including O2 on RA    Exercises     Shoulder Instructions      Home Living Family/patient expects to be discharged to:: Private residence Living Arrangements: Alone Available Help at Discharge: Family;Available PRN/intermittently Type of Home: Apartment Home Access: Stairs to enter Entrance Stairs-Number of Steps: 3 Entrance Stairs-Rails: None Home Layout: One level     Bathroom Shower/Tub: Teacher, early years/pre: Standard Bathroom Accessibility: Yes How Accessible: Accessible via walker Home Equipment: [employer] - 4 wheels;Shower seat;Hand held shower head(3 wheels; )          Prior Functioning/Environment Level of Independence: Needs assistance  Gait / Transfers Assistance Needed: Modified independent household distances with rollator. ADL's / Homemaking Assistance Needed: Daughter does grocery shopping    Comments: Pt reports she has been depressed and doesn't leave her  apt. Keeps blinds drawn.         OT Problem List: Decreased strength;Decreased range of motion;Decreased activity tolerance;Impaired balance (sitting and/or standing);Decreased knowledge of use of DME or AE;Decreased knowledge of precautions      OT Treatment/Interventions: Self-care/ADL training;Therapeutic exercise;Energy conservation;DME and/or AE instruction;Therapeutic activities;Patient/family education    OT Goals(Current goals can be found in the care plan section) Acute Rehab OT Goals Patient Stated Goal: to go home OT Goal Formulation: With patient Time For Goal Achievement: 08/03/19 Potential to Achieve Goals: Good ADL Goals Pt Will Perform Grooming: with supervision;standing Pt Will Perform Lower Body Bathing: with modified independence;with adaptive equipment;sit to/from stand Pt Will Perform Lower Body Dressing: with modified independence;sit to/from stand;with adaptive equipment Pt Will Transfer to Toilet: with modified independence;ambulating Pt Will Perform Toileting - Clothing Manipulation and hygiene: with modified independence;sit to/from stand Additional ADL Goal #1: Pt will verbalize 3 energy conservation strategies for ADL/IADL with min cues  OT Frequency: Min 2X/week   Barriers to D/C: Decreased caregiver support(Pt declining to return to again to SNF)          Co-evaluation              AM-PAC OT "6 Clicks" Daily Activity     Outcome Measure Help from another person eating meals?: None Help from another person taking care of personal grooming?: A Little Help from another person toileting, which includes  using toliet, bedpan, or urinal?: A Little Help from another person bathing (including washing, rinsing, drying)?: A Lot Help from another person to put on and taking off regular upper body clothing?: A Little Help from another person to put on and taking off regular lower body clothing?: A Lot 6 Click Score: 17   End of Session Equipment Utilized  During Treatment: (3 wheeled walker) Nurse Communication: Mobility status  Activity Tolerance: Patient tolerated treatment well Patient left: in bed;with call bell/phone within reach  OT Visit Diagnosis: Unsteadiness on feet (R26.81);Other abnormalities of gait and mobility (R26.89);Muscle weakness (generalized) (M62.81);History of falling (Z91.81)                Time: [id number] OT Time Calculation (min): 65 min Charges:  OT General Charges $OT Visit: 1 Visit OT Evaluation $OT Eval Moderate Complexity: 1 Mod OT Treatments $Self Care/Home Management : 8-22 mins $Therapeutic Activity: 8-22 mins  Hulda Humphrey OTR/L Acute Rehabilitation Services Pager: [phone] Office: [phone]  Merri Ray Quantavis Obryant 07/20/2019, 2:53 PM

## 2019-07-20 NOTE — Progress Notes (Signed)
PROGRESS NOTE                                                                                                                                                                                                             Patient Demographics:    Kristen Shaffer, is a 63 y.o. female, DOB - [birth year], PPJ:[id number]  Outpatient Primary MD for the patient is Nolene Ebbs, MD    LOS - 2  Admit date - 07/17/2019    Chief Complaint  Patient presents with   Leg Swelling       Brief Narrative  - TARAH BUBOLTZ is a 63 y.o. female with history of hypertension, morbid obesity presents to the ER because of increasing bilateral lower extremity edema ongoing for last 2 days with some erythema and pain.  Patient also has been a chronic shortness of breath.  Patient was admitted last month and discharged on June 16, 2019 after being treated for COVID-19 pneumonia, she presented now with lower extremity swelling and was admitted for possible cellulitis.   Subjective:   Patient in bed, appears comfortable, denies any headache, no fever, no chest pain or pressure, no shortness of breath , no abdominal pain. No focal weakness.  Some chronic lower back pain.   Assessment  & Plan :    1.  Covid 19 Viral Infection - was diagnosed several weeks ago and this is not an active issue.  No pulmonary problems.   COVID-19 Labs  Recent Labs    07/18/19 0213 07/18/19 0653 07/19/19 0155  DDIMER 1.22*  --  1.16*  FERRITIN  --  94 84  LDH  --  239* 187  CRP  --  4.9* 4.9*    Lab Results  Component Value Date   SARSCOV2NAA POSITIVE (A) 07/18/2019   SARSCOV2NAA DETECTED (A) 06/13/2019   SARSCOV2NAA POSITIVE (A) 06/08/2019     Hepatic Function Latest Ref Rng & Units 07/20/2019 07/19/2019 07/18/2019  Total Protein 6.5 - 8.1 g/dL 6.4(L) 6.6 6.5  Albumin 3.5 - 5.0 g/dL 3.4(L) 3.4(L) 3.5  AST 15 - 41 U/L 33 30 37  ALT 0 - 44 U/L 28  27 29  Alk  Phosphatase 38 - 126 U/L 79 90 103  Total Bilirubin 0.3 - 1.2 mg/dL 1.2 1.1 1.5(H)  Bilirubin, Direct 0.0 - 0.2 mg/dL - - -        Component Value Date/Time   BNP 20.7 07/20/2019 0330      2.  Mild bilateral left greater than right lower extremity cellulitis with 2+ edema.  Large element is of edema causing erythema, she was never febrile and legs were never warm, she did have mild leukocytosis hence mild cellulitis cannot be completely ruled out.  She has been treated with Lasix, TED stockings, fluid restriction along with oral doxycycline with excellent improvement.  Continue present line of care.  No DVT in lower extremities.  In my opinion her presentation was more consistent with peripheral edema causing skin erythema.  3.  Acute on chronic diastolic CHF EF 81%.  Continue combination of Lasix and beta-blocker which has been started here, outpatient cardiology follow-up.  4.  Hypertension.  Stable on beta-blocker and diuretic regimen.  5.  Underlying history of depression.  Outpatient psych follow-up.  No acute issues.  6.  Morbid obesity BMI of 44.  Follow with PCP for weight loss.  7.  Chronic back pain and deconditioning.  PT eval, medications adjusted, request SNF placement.  Will consult case management and social work.  Condition - Stable  Family Communication  :  None  Code Status :  Full  Diet :    Diet Order            Diet Heart Room service appropriate? Yes; Fluid consistency: Thin; Fluid restriction: 1200 mL Fluid  Diet effective now               Disposition Plan  : Discharge once disposition decided by case management and arrange.  Consults  :  None  Procedures  :    TTE   1. The left ventricle has normal systolic function, with an ejection fraction of 55-60%. The cavity size was normal. There is mildly increased left ventricular wall thickness. Left ventricular diastolic Doppler parameters are consistent with impaired  relaxation. No evidence of  left ventricular regional wall motion abnormalities.  2. The aortic root is normal in size and structure.  3. The aortic valve is tricuspid. Mild calcification of the aortic valve. No stenosis of the aortic valve.  4. The right ventricle has normal systolic function. The cavity was normal. There is no increase in right ventricular wall thickness.  5. Trivial pericardial effusion is present.  6. No evidence of mitral valve stenosis. No mitral regurgitation.  7. The IVC was poorly visualized. Peak RV-RA gradient 26 mmHg.  8. Technically difficult study with poor acoustic windows.    Lower extremity venous duplex - no DVT.   PUD Prophylaxis :    DVT Prophylaxis  :  Lovenox added  Lab Results  Component Value Date   PLT 284 07/20/2019    Inpatient Medications  Scheduled Meds:  carvedilol  6.25 mg Oral BID WC   doxycycline  100 mg Oral Q12H   enoxaparin (LOVENOX) injection  70 mg Subcutaneous Q24H   furosemide  40 mg Intravenous Once   gabapentin  300 mg Oral TID   isosorbide mononitrate  30 mg Oral Daily   potassium chloride  40 mEq Oral Once   sodium chloride flush  3 mL Intravenous Once   Continuous Infusions:  PRN Meds:.albuterol, hydrALAZINE, [DISCONTINUED] ondansetron **OR** ondansetron (ZOFRAN) IV, traMADol, zolpidem  Antibiotics  :    Anti-infectives (From admission, onward)   Start     Dose/Rate Route Frequency Ordered Stop   07/19/19 1000  doxycycline (VIBRA-TABS) tablet 100 mg     100 mg Oral Every 12 hours 07/19/19 0905     07/18/19 2200  vancomycin (VANCOCIN) 1,500 mg in sodium chloride 0.9 % 500 mL IVPB  Status:  Discontinued     1,500 mg 250 mL/hr over 120 Minutes Intravenous Every 12 hours 07/18/19 0559 07/19/19 0905   07/18/19 0900  ceFEPIme (MAXIPIME) 2 g in sodium chloride 0.9 % 100 mL IVPB  Status:  Discontinued     2 g 200 mL/hr over 30 Minutes Intravenous Every 8 hours 07/18/19 0841 07/19/19 0905   07/18/19 0700  piperacillin-tazobactam  (ZOSYN) IVPB 3.375 g  Status:  Discontinued     3.375 g 12.5 mL/hr over 240 Minutes Intravenous Every 8 hours 07/18/19 0658 07/18/19 0834   07/18/19 0330  vancomycin (VANCOCIN) 2,000 mg in sodium chloride 0.9 % 500 mL IVPB     2,000 mg 250 mL/hr over 120 Minutes Intravenous  Once 07/18/19 0329 07/18/19 1012       Time Spent in minutes  30   Lala Lund M.D on 07/20/2019 at 8:37 AM  To page go to [email or web address] - password [facility]  [facility] -  Office  [phone]  See all Orders from today for further details    Objective:   Vitals:   07/19/19 2015 07/20/19 0500 07/20/19 0510 07/20/19 0702  BP: (!) 101/52  117/75   Pulse: 82  96 93  Resp: _0 Temp: 97.6 F (36.4 C)  98.5 F (36.9 C)   TempSrc: Oral  Oral   SpO2: 98%  96% 97%  Weight:  (!) 137.2 kg    Height:        Wt Readings from Last 3 Encounters:  07/20/19 (!) 137.2 kg  06/08/19 (!) 139.1 kg  10/16/15 (!) 141.2 kg     Intake/Output Summary (Last 24 hours) at 07/20/2019 0837 Last data filed at 07/19/2019 1500 Gross per 24 hour  Intake 240 ml  Output 300 ml  Net -60 ml     Physical Exam  Awake Alert, Oriented X 3, No new F.N deficits, Normal affect .AT,PERRAL Supple Neck,No JVD, No cervical lymphadenopathy appriciated.  Symmetrical Chest wall movement, Good air movement bilaterally, CTAB RRR,No Gallops, Rubs or new Murmurs, No Parasternal Heave +ve B.Sounds, Abd Soft, No tenderness, No organomegaly appriciated, No rebound - guarding or rigidity. No Cyanosis,  1+ lower extremity edema with left leg redness around ankle but no warmth   Data Review:    CBC Recent Labs  Lab 07/17/19 2337 07/18/19 0653 07/19/19 0155 07/20/19 0330  WBC 11.4* 8.3 7.1 10.0  HGB 14.4 14.2 13.6 12.6  HCT 45.2 45.0 43.5 40.4  PLT 311 282 298 284  MCV 85.0 85.4 85.6 86.1  MCH 27.1 26.9 26.8 26.9  MCHC 31.9 31.6 31.3 31.2  RDW 17.9* 17.9* 18.2* 17.6*  LYMPHSABS  --  2.0 2.8 2.4  MONOABS  --  0.6 0.9  0.9  EOSABS  --  0.2 0.2 0.2  BASOSABS  --  0.1 0.0 0.0    Chemistries  Recent Labs  Lab 07/17/19 2337 07/18/19 0653 07/19/19 0155 07/20/19 0330  NA 139 142 141 140  K 3.3* 3.3* 3.6 4.0  CL 105 106 107 104  CO2 21* _1 GLUCOSE 105* 131* 85 85  BUN _0 CREATININE 0.75 0.76 0.79 0.73  CALCIUM 9.3 9.2 9.2 9.1  MG  --  2.0 1.9 2.1  AST  --  37 30 33  ALT  --  _1 ALKPHOS  --  103 90 79  BILITOT  --  1.5* 1.1 1.2   ------------------------------------------------------------------------------------------------------------------ No results for input(s): CHOL, HDL, LDLCALC, TRIG, CHOLHDL, LDLDIRECT in the last 72 hours.  No results found for: HGBA1C ------------------------------------------------------------------------------------------------------------------ Recent Labs    07/18/19 0654  TSH 1.459    Cardiac Enzymes No results for input(s): CKMB, TROPONINI, MYOGLOBIN in the last 168 hours.  Invalid input(s): CK ------------------------------------------------------------------------------------------------------------------    Component Value Date/Time   BNP 20.7 07/20/2019 0330    Micro Results Recent Results (from the past 240 hour(s))  Urine Culture     Status: Abnormal   Collection Time: 07/13/19  4:39 PM   Specimen: Urine  Result Value Ref Range Status   MICRO NUMBER: [id number]  Final   SPECIMEN QUALITY: Adequate  Final   Sample Source URINE  Final   STATUS: FINAL  Final   ISOLATE 1: Escherichia coli (A)  Final    Comment: Greater than 100,000 CFU/mL of Escherichia coli      Susceptibility   Escherichia coli - URINE CULTURE, REFLEX    AMOX/CLAVULANIC <=2 Sensitive     AMPICILLIN 4 Sensitive     AMPICILLIN/SULBACTAM <=2 Sensitive     CEFAZOLIN* <=4 Not Reportable      * For infections other than uncomplicated UTIcaused by E. coli, K. pneumoniae or P. mirabilis:Cefazolin is resistant if MIC > or = 8 mcg/mL.(Distinguishing  susceptible versus intermediatefor isolates with MIC < or = 4 mcg/mL requiresadditional testing.)For uncomplicated UTI caused by E. coli,K. pneumoniae or P. mirabilis: Cefazolin issusceptible if MIC <32 mcg/mL and predictssusceptible to the oral agents cefaclor, cefdinir,cefpodoxime, cefprozil, cefuroxime, cephalexinand loracarbef.    CEFEPIME <=1 Sensitive     CEFTRIAXONE <=1 Sensitive     CIPROFLOXACIN <=0.25 Sensitive     LEVOFLOXACIN <=0.12 Sensitive     ERTAPENEM <=0.5 Sensitive     GENTAMICIN <=1 Sensitive     IMIPENEM <=0.25 Sensitive     NITROFURANTOIN <=16 Sensitive     PIP/TAZO <=4 Sensitive     TOBRAMYCIN <=1 Sensitive     TRIMETH/SULFA* <=20 Sensitive      * For infections other than uncomplicated UTIcaused by E. coli, K. pneumoniae or P. mirabilis:Cefazolin is resistant if MIC > or = 8 mcg/mL.(Distinguishing susceptible versus intermediatefor isolates with MIC < or = 4 mcg/mL requiresadditional testing.)For uncomplicated UTI caused by E. coli,K. pneumoniae or P. mirabilis: Cefazolin issusceptible if MIC <32 mcg/mL and predictssusceptible to the oral agents cefaclor, cefdinir,cefpodoxime, cefprozil, cefuroxime, cephalexinand loracarbef.Legend:S = Susceptible  I = IntermediateR = Resistant  NS = Not susceptible* = Not tested  NR = Not reported**NN = See antimicrobic comments  SARS Coronavirus 2 [facility] order, Performed in [facility] hospital lab) Nasopharyngeal Nasopharyngeal Swab     Status: Abnormal   Collection Time: 07/18/19  2:10 AM   Specimen: Nasopharyngeal Swab  Result Value Ref Range Status   SARS Coronavirus 2 POSITIVE (A) NEGATIVE Final    Comment: RESULT CALLED TO, READ BACK BY AND VERIFIED WITH: B. BAILIFF,RN [id number] 07/18/2019 T. TYSOR (NOTE) If result is NEGATIVE SARS-CoV-2 target nucleic acids are NOT DETECTED. The SARS-CoV-2 RNA is generally detectable in upper and lower  respiratory specimens during the acute phase of infection. The lowest  concentration of  SARS-CoV-2 viral copies this assay can detect is 250  copies / mL. A negative result does not preclude SARS-CoV-2 infection  and should not be used as the sole basis for treatment or other  patient management decisions.  A negative result may occur with  improper specimen collection / handling, submission of specimen other  than nasopharyngeal swab, presence of viral mutation(s) within the  areas targeted by this assay, and inadequate number of viral copies  (<250 copies / mL). A negative result must be combined with clinical  observations, patient history, and epidemiological information. If result is POSITIVE SARS-CoV-2 target nucleic acids are DETECTED.  The SARS-CoV-2 RNA is generally detectable in upper and lower  respiratory specimens during the acute phase of infection.  Positive  results are indicative of active infection with SARS-CoV-2.  Clinical  correlation with patient history and other diagnostic information is  necessary to determine patient infection status.  Positive results do  not rule out bacterial infection or co-infection with other viruses. If result is PRESUMPTIVE POSTIVE SARS-CoV-2 nucleic acids MAY BE PRESENT.   A presumptive positive result was obtained on the submitted specimen  and confirmed on repeat testing.  While 2019 novel coronavirus  (SARS-CoV-2) nucleic acids may be present in the submitted sample  additional confirmatory testing may be necessary for epidemiological  and / or clinical management purposes  to differentiate between  SARS-CoV-2 and other Sarbecovirus currently known to infect humans.  If clinically indicated additional testing with an alternate test  methodology [phone])  is advised. The SARS-CoV-2 RNA is generally  detectable in upper and lower respiratory specimens during the acute  phase of infection. The expected result is Negative. Fact Sheet for Patients:  StrictlyIdeas.no Fact Sheet for Healthcare  Providers: BankingDealers.co.za This test is not yet approved or cleared by the [place] FDA and has been authorized for detection and/or diagnosis of SARS-CoV-2 by FDA under an Emergency Use Authorization (EUA).  This EUA will remain in effect (meaning this test can be used) for the duration of the COVID-19 declaration under Section 564(b)(1) of the Act, 21 U.S.C. section 360bbb-3(b)(1), unless the authorization is terminated or revoked sooner. Performed at [facility], [place] [address] [place], [place] [address]   Urine Culture     Status: Abnormal   Collection Time: 07/18/19  5:19 AM   Specimen: Urine, Clean Catch  Result Value Ref Range Status   Specimen Description URINE, CLEAN CATCH  Final   Special Requests   Final    NONE Performed at [facility], [place] [address] [place], [place] [address]    Culture MULTIPLE SPECIES PRESENT, SUGGEST RECOLLECTION (A)  Final   Report Status 07/19/2019 FINAL  Final    Radiology Reports Dg Chest 2 View  Result Date: 07/17/2019 CLINICAL DATA:  Lower extremity edema EXAM: CHEST - 2 VIEW COMPARISON:  June 08, 2019 FINDINGS: Heart size is enlarged. There is generalized volume overload with possible early pulmonary edema. There appears to be a more focal opacity at the right lung base which may represent atelectasis or pneumonia. The lateral view is somewhat limited by motion and patient body habitus. There is no pneumothorax. IMPRESSION: 1. Cardiomegaly with mild volume overload. 2. Opacity at the right lung base may represent atelectasis or pneumonia. Electronically Signed   By: Constance Holster M.D.   On: 07/17/2019 23:02   Dg Ankle 2 Views Right  Result Date: 07/18/2019 CLINICAL DATA:  Bilateral lower extremity edema. EXAM: RIGHT ANKLE -  2 VIEW COMPARISON:  None. FINDINGS: There is no evidence of fracture, dislocation, or joint effusion. Prominent Achilles tendon enthesophyte. There is no evidence of  arthropathy or other focal bone abnormality. Generalized soft tissue edema. IMPRESSION: Generalized soft tissue edema. No acute osseous abnormality. Electronically Signed   By: Keith Rake M.D.   On: 07/18/2019 02:32   Dg Ankle Complete Left  Result Date: 07/18/2019 CLINICAL DATA:  Bilateral lower extremity edema. EXAM: LEFT ANKLE COMPLETE - 3+ VIEW COMPARISON:  None. FINDINGS: There is no evidence of fracture, dislocation, or joint effusion. There is no evidence of arthropathy or other focal bone abnormality. Achilles tendon enthesophyte. Generalized soft tissue edema. IMPRESSION: Generalized soft tissue edema. No osseous abnormality. Electronically Signed   By: Keith Rake M.D.   On: 07/18/2019 02:31   Vas [place] Lower Extremity Venous (dvt)  Result Date: 07/18/2019  Lower Venous Study Indications: Edema.  Limitations: Poor ultrasound/tissue interface and body habitus. Comparison Study: no prior Performing Technologist: Abram Sander RVS  Examination Guidelines: A complete evaluation includes B-mode imaging, spectral Doppler, color Doppler, and power Doppler as needed of all accessible portions of each vessel. Bilateral testing is considered an integral part of a complete examination. Limited examinations for reoccurring indications may be performed as noted.  +---------+---------------+---------+-----------+----------+--------------+  RIGHT     Compressibility Phasicity Spontaneity Properties Thrombus Aging  +---------+---------------+---------+-----------+----------+--------------+  CFV       Full            Yes       Yes                                    +---------+---------------+---------+-----------+----------+--------------+  SFJ       Full                                                             +---------+---------------+---------+-----------+----------+--------------+  FV Prox   Full                                                              +---------+---------------+---------+-----------+----------+--------------+  FV Mid    Full                                                             +---------+---------------+---------+-----------+----------+--------------+  FV Distal Full                                                             +---------+---------------+---------+-----------+----------+--------------+  PFV       Full                                                             +---------+---------------+---------+-----------+----------+--------------+  POP       Full            Yes       Yes                                    +---------+---------------+---------+-----------+----------+--------------+  PTV       Full                                                             +---------+---------------+---------+-----------+----------+--------------+  PERO      Full                                                             +---------+---------------+---------+-----------+----------+--------------+   +---------+---------------+---------+-----------+----------+--------------+  LEFT      Compressibility Phasicity Spontaneity Properties Thrombus Aging  +---------+---------------+---------+-----------+----------+--------------+  CFV       Full            Yes       Yes                                    +---------+---------------+---------+-----------+----------+--------------+  SFJ       Full                                                             +---------+---------------+---------+-----------+----------+--------------+  FV Prox   Full                                                             +---------+---------------+---------+-----------+----------+--------------+  FV Mid    Full                                                             +---------+---------------+---------+-----------+----------+--------------+  FV Distal Full                                                              +---------+---------------+---------+-----------+----------+--------------+  PFV       Full                                                             +---------+---------------+---------+-----------+----------+--------------+  POP       Full            Yes       Yes                                    +---------+---------------+---------+-----------+----------+--------------+  PTV       Full                                                             +---------+---------------+---------+-----------+----------+--------------+  PERO                                                       Not visualized  +---------+---------------+---------+-----------+----------+--------------+     Summary: Right: There is no evidence of deep vein thrombosis in the lower extremity. No cystic structure found in the popliteal fossa. Left: There is no evidence of deep vein thrombosis in the lower extremity. No cystic structure found in the popliteal fossa.  *See table(s) above for measurements and observations. Electronically signed by Monica Martinez MD on 07/18/2019 at 3:07:22 PM.    Final

## 2019-07-20 NOTE — Progress Notes (Signed)
Attempted to update family, no answer. Left voicemail with callback number

## 2019-07-21 LAB — BRAIN NATRIURETIC PEPTIDE: B Natriuretic Peptide: 29.3 pg/mL (ref 0.0–100.0)

## 2019-07-21 LAB — BASIC METABOLIC PANEL
Anion gap: 14 (ref 5–15)
BUN: 17 mg/dL (ref 8–23)
CO2: 24 mmol/L (ref 22–32)
Calcium: 9.8 mg/dL (ref 8.9–10.3)
Chloride: 102 mmol/L (ref 98–111)
Creatinine, Ser: 0.79 mg/dL (ref 0.44–1.00)
GFR calc Af Amer: >60 mL/min (ref >60)
GFR calc non Af Amer: >60 mL/min (ref >60)
Glucose, Bld: 88 mg/dL (ref 70–99)
Potassium: 3.6 mmol/L (ref 3.5–5.1)
Sodium: 140 mmol/L (ref 135–145)

## 2019-07-21 LAB — MAGNESIUM: Magnesium: 2.1 mg/dL (ref 1.7–2.4)

## 2019-07-21 MED ORDER — FUROSEMIDE 10 MG/ML IJ SOLN
40.0000 mg | Freq: Every day | INTRAMUSCULAR | Status: DC
  Administered 2019-07-21 – 2019-07-22 (×2): 40 mg via INTRAVENOUS
  Filled 2019-07-21 (×2): qty 4

## 2019-07-21 MED ORDER — MAGNESIUM HYDROXIDE 400 MG/5ML PO SUSP
30.0000 mL | Freq: Two times a day (BID) | ORAL | Status: AC
  Administered 2019-07-21 (×2): 30 mL via ORAL
  Filled 2019-07-21 (×2): qty 30

## 2019-07-21 MED ORDER — POLYETHYLENE GLYCOL 3350 17 G PO PACK
17.0000 g | PACK | Freq: Two times a day (BID) | ORAL | Status: DC
  Administered 2019-07-21 – 2019-07-22 (×3): 17 g via ORAL
  Filled 2019-07-21 (×3): qty 1

## 2019-07-21 MED ORDER — POTASSIUM CHLORIDE CRYS ER 20 MEQ PO TBCR
40.0000 meq | EXTENDED_RELEASE_TABLET | Freq: Every day | ORAL | Status: DC
  Administered 2019-07-21: 40 meq via ORAL
  Filled 2019-07-21: qty 2

## 2019-07-21 NOTE — Progress Notes (Signed)
CSW placed outpatient resources on patients AVS.   Kingsley Spittle, LCSW [facility]  [phone]

## 2019-07-21 NOTE — Progress Notes (Addendum)
PROGRESS NOTE                                                                                                                                                                                                             Patient Demographics:    Kristen Shaffer, is a 63 y.o. female, DOB - 1956/07/26, FPU:[id number]  Outpatient Primary MD for the patient is Nolene Ebbs, MD    LOS - 3  Admit date - 07/17/2019    Chief Complaint  Patient presents with   Leg Swelling       Brief Narrative  - Kristen Shaffer is a 63 y.o. female with history of hypertension, morbid obesity presents to the ER because of increasing bilateral lower extremity edema ongoing for last 2 days with some erythema and pain.  Patient also has been a chronic shortness of breath.  Patient was admitted last month and discharged on June 16, 2019 after being treated for COVID-19 pneumonia, she presented now with lower extremity swelling and was admitted for possible cellulitis.   Subjective:   Patient in bed, appears comfortable, denies any headache, no fever, no chest pain or pressure, no shortness of breath , no abdominal pain. No focal weakness. Improved chronic lower back pain.  Note patient had 2 pages full of questions which were answered over a period of 40 minutes in the room.   Assessment  & Plan :    1.  Covid 19 Viral Infection - was diagnosed several weeks ago and this is not an active issue.  No pulmonary problems.   COVID-19 Labs  Recent Labs    07/19/19 0155  DDIMER 1.16*  FERRITIN 84  LDH 187  CRP 4.9*    Lab Results  Component Value Date   SARSCOV2NAA POSITIVE (A) 07/18/2019   SARSCOV2NAA DETECTED (A) 06/13/2019   SARSCOV2NAA POSITIVE (A) 06/08/2019     Hepatic Function Latest Ref Rng & Units 07/20/2019 07/19/2019 07/18/2019  Total Protein 6.5 - 8.1 g/dL 6.4(L) 6.6 6.5  Albumin 3.5 - 5.0 g/dL 3.4(L) 3.4(L) 3.5  AST 15 - 41 U/L 33 30  37  ALT 0 - 44 U/L '28 27 29  '$ Alk Phosphatase 38 - 126 U/L 79 90 103  Total Bilirubin 0.3 - 1.2 mg/dL 1.2 1.1 1.5(H)  Bilirubin, Direct 0.0 - 0.2 mg/dL - - -  Component Value Date/Time   BNP 29.3 07/21/2019 0300      2.  Mild bilateral left greater than right lower extremity cellulitis with 2+ edema.  Large element is of edema causing erythema, she was never febrile and legs were never warm, she did have mild leukocytosis hence mild cellulitis cannot be completely ruled out.  She has been treated with Lasix, TED stockings, fluid restriction along with oral doxycycline with excellent improvement.  Continue present line of care.  No DVT in lower extremities.  In my opinion her presentation was more consistent with peripheral edema causing skin erythema.  3.  Acute on chronic diastolic CHF EF 16%.  Continue combination of Lasix and beta-blocker which has been started here, outpatient cardiology follow-up.  4.  Hypertension.  Stable on beta-blocker and diuretic regimen.  5.  Underlying history of depression.  Outpatient psych follow-up.  No acute issues.  6.  Morbid obesity BMI of 44.  Follow with PCP for weight loss.  7.  Chronic back pain and deconditioning.  PT following, patient wants to go home health PT which has been arranged along with DME.  8.  Constipation.  Placed on bowel regimen.    Condition - Stable  Family Communication  :  None  Code Status :  Full  Diet :    Diet Order            Diet Heart Room service appropriate? Yes; Fluid consistency: Thin; Fluid restriction: 1200 mL Fluid  Diet effective now               Disposition Plan  : DC in am  Consults  :  None  Procedures  :    TTE   1. The left ventricle has normal systolic function, with an ejection fraction of 55-60%. The cavity size was normal. There is mildly increased left ventricular wall thickness. Left ventricular diastolic Doppler parameters are consistent with impaired  relaxation. No  evidence of left ventricular regional wall motion abnormalities.  2. The aortic root is normal in size and structure.  3. The aortic valve is tricuspid. Mild calcification of the aortic valve. No stenosis of the aortic valve.  4. The right ventricle has normal systolic function. The cavity was normal. There is no increase in right ventricular wall thickness.  5. Trivial pericardial effusion is present.  6. No evidence of mitral valve stenosis. No mitral regurgitation.  7. The IVC was poorly visualized. Peak RV-RA gradient 26 mmHg.  8. Technically difficult study with poor acoustic windows.    Lower extremity venous duplex - no DVT.   PUD Prophylaxis :    DVT Prophylaxis  :  Lovenox added  Lab Results  Component Value Date   PLT 284 07/20/2019    Inpatient Medications  Scheduled Meds:  carvedilol  6.25 mg Oral BID WC   doxycycline  100 mg Oral Q12H   enoxaparin (LOVENOX) injection  70 mg Subcutaneous Q24H   furosemide  40 mg Intravenous Daily   gabapentin  300 mg Oral TID   isosorbide mononitrate  30 mg Oral Daily   magnesium hydroxide  30 mL Oral BID   polyethylene glycol  17 g Oral BID   potassium chloride  40 mEq Oral Daily   sodium chloride flush  3 mL Intravenous Once   Continuous Infusions:  PRN Meds:.albuterol, hydrALAZINE, [DISCONTINUED] ondansetron **OR** ondansetron (ZOFRAN) IV, traMADol, zolpidem  Antibiotics  :    Anti-infectives (From admission, onward)   Start  Dose/Rate Route Frequency Ordered Stop   07/19/19 1000  doxycycline (VIBRA-TABS) tablet 100 mg     100 mg Oral Every 12 hours 07/19/19 0905     07/18/19 2200  vancomycin (VANCOCIN) 1,500 mg in sodium chloride 0.9 % 500 mL IVPB  Status:  Discontinued     1,500 mg 250 mL/hr over 120 Minutes Intravenous Every 12 hours 07/18/19 0559 07/19/19 0905   07/18/19 0900  ceFEPIme (MAXIPIME) 2 g in sodium chloride 0.9 % 100 mL IVPB  Status:  Discontinued     2 g 200 mL/hr over 30 Minutes  Intravenous Every 8 hours 07/18/19 0841 07/19/19 0905   07/18/19 0700  piperacillin-tazobactam (ZOSYN) IVPB 3.375 g  Status:  Discontinued     3.375 g 12.5 mL/hr over 240 Minutes Intravenous Every 8 hours 07/18/19 0658 07/18/19 0834   07/18/19 0330  vancomycin (VANCOCIN) 2,000 mg in sodium chloride 0.9 % 500 mL IVPB     2,000 mg 250 mL/hr over 120 Minutes Intravenous  Once 07/18/19 0329 07/18/19 1012       Time Spent in minutes  30   Lala Lund M.D on 07/21/2019 at 9:10 AM  To page go to [email or web address] - password [facility] [address]  [facility] -  Office  [phone]  See all Orders from today for further details    Objective:   Vitals:   07/21/19 0500 07/21/19 0600 07/21/19 0827 07/21/19 0855  BP:   139/77 139/77  Pulse: 95 88 96 100  Resp:      Temp:   97.8 F (36.6 C)   TempSrc:   Oral   SpO2: 97% 94% 98%   Weight:      Height:        Wt Readings from Last 3 Encounters:  07/20/19 (!) 137.2 kg  06/08/19 (!) 139.1 kg  10/16/15 (!) 141.2 kg     Intake/Output Summary (Last 24 hours) at 07/21/2019 0910 Last data filed at 07/21/2019 0311 Gross per 24 hour  Intake 0 ml  Output 1875 ml  Net -1875 ml     Physical Exam  Awake Alert, Oriented X 3, No new F.N deficits,   [place].AT,PERRAL Supple Neck,No JVD, No cervical lymphadenopathy appriciated.  Symmetrical Chest wall movement, Good air movement bilaterally, CTAB RRR,No Gallops, Rubs or new Murmurs, No Parasternal Heave +ve B.Sounds, Abd Soft, No tenderness, No organomegaly appriciated, No rebound - guarding or rigidity. No Cyanosis, trace lower extremity edema with left leg redness around ankle but no warmth   Data Review:    CBC Recent Labs  Lab 07/17/19 2337 07/18/19 0653 07/19/19 0155 07/20/19 0330  WBC 11.4* 8.3 7.1 10.0  HGB 14.4 14.2 13.6 12.6  HCT 45.2 45.0 43.5 40.4  PLT 311 282 298 284  MCV 85.0 85.4 85.6 86.1  MCH 27.1 26.9 26.8 26.9  MCHC 31.9 31.6 31.3 31.2  RDW 17.9* 17.9* 18.2* 17.6*    LYMPHSABS  --  2.0 2.8 2.4  MONOABS  --  0.6 0.9 0.9  EOSABS  --  0.2 0.2 0.2  BASOSABS  --  0.1 0.0 0.0    Chemistries  Recent Labs  Lab 07/17/19 2337 07/18/19 0653 07/19/19 0155 07/20/19 0330 07/21/19 0300  NA 139 142 141 140 140  K 3.3* 3.3* 3.6 4.0 3.6  CL 105 106 107 104 102  CO2 21* '24 24 27 24  '$ GLUCOSE 105* 131* 85 85 88  BUN '13 11 14 16 17  '$ CREATININE 0.75 0.76 0.79 0.73 0.79  CALCIUM 9.3  9.2 9.2 9.1 9.8  MG  --  2.0 1.9 2.1 2.1  AST  --  37 30 33  --   ALT  --  '29 27 28  '$ --   ALKPHOS  --  103 90 79  --   BILITOT  --  1.5* 1.1 1.2  --    ------------------------------------------------------------------------------------------------------------------ No results for input(s): CHOL, HDL, LDLCALC, TRIG, CHOLHDL, LDLDIRECT in the last 72 hours.  No results found for: HGBA1C ------------------------------------------------------------------------------------------------------------------ No results for input(s): TSH, T4TOTAL, T3FREE, THYROIDAB in the last 72 hours.  Invalid input(s): FREET3  Cardiac Enzymes No results for input(s): CKMB, TROPONINI, MYOGLOBIN in the last 168 hours.  Invalid input(s): CK ------------------------------------------------------------------------------------------------------------------    Component Value Date/Time   BNP 29.3 07/21/2019 0300    Micro Results Recent Results (from the past 240 hour(s))  Urine Culture     Status: Abnormal   Collection Time: 07/13/19  4:39 PM   Specimen: Urine  Result Value Ref Range Status   MICRO NUMBER: [id number]  Final   SPECIMEN QUALITY: Adequate  Final   Sample Source URINE  Final   STATUS: FINAL  Final   ISOLATE 1: Escherichia coli (A)  Final    Comment: Greater than 100,000 CFU/mL of Escherichia coli      Susceptibility   Escherichia coli - URINE CULTURE, REFLEX    AMOX/CLAVULANIC <=2 Sensitive     AMPICILLIN 4 Sensitive     AMPICILLIN/SULBACTAM <=2 Sensitive     CEFAZOLIN* <=4 Not  Reportable      * For infections other than uncomplicated UTIcaused by E. coli, K. pneumoniae or P. mirabilis:Cefazolin is resistant if MIC > or = 8 mcg/mL.(Distinguishing susceptible versus intermediatefor isolates with MIC < or = 4 mcg/mL requiresadditional testing.)For uncomplicated UTI caused by E. coli,K. pneumoniae or P. mirabilis: Cefazolin issusceptible if MIC <32 mcg/mL and predictssusceptible to the oral agents cefaclor, cefdinir,cefpodoxime, cefprozil, cefuroxime, cephalexinand loracarbef.    CEFEPIME <=1 Sensitive     CEFTRIAXONE <=1 Sensitive     CIPROFLOXACIN <=0.25 Sensitive     LEVOFLOXACIN <=0.12 Sensitive     ERTAPENEM <=0.5 Sensitive     GENTAMICIN <=1 Sensitive     IMIPENEM <=0.25 Sensitive     NITROFURANTOIN <=16 Sensitive     PIP/TAZO <=4 Sensitive     TOBRAMYCIN <=1 Sensitive     TRIMETH/SULFA* <=20 Sensitive      * For infections other than uncomplicated UTIcaused by E. coli, K. pneumoniae or P. mirabilis:Cefazolin is resistant if MIC > or = 8 mcg/mL.(Distinguishing susceptible versus intermediatefor isolates with MIC < or = 4 mcg/mL requiresadditional testing.)For uncomplicated UTI caused by E. coli,K. pneumoniae or P. mirabilis: Cefazolin issusceptible if MIC <32 mcg/mL and predictssusceptible to the oral agents cefaclor, cefdinir,cefpodoxime, cefprozil, cefuroxime, cephalexinand loracarbef.Legend:S = Susceptible  I = IntermediateR = Resistant  NS = Not susceptible* = Not tested  NR = Not reported**NN = See antimicrobic comments  SARS Coronavirus 2 [facility] order, Performed in [facility] hospital lab) Nasopharyngeal Nasopharyngeal Swab     Status: Abnormal   Collection Time: 07/18/19  2:10 AM   Specimen: Nasopharyngeal Swab  Result Value Ref Range Status   SARS Coronavirus 2 POSITIVE (A) NEGATIVE Final    Comment: RESULT CALLED TO, READ BACK BY AND VERIFIED WITH: B. BAILIFF,RN [id number] 07/18/2019 T. TYSOR (NOTE) If result is NEGATIVE SARS-CoV-2 target nucleic acids are  NOT DETECTED. The SARS-CoV-2 RNA is generally detectable in upper and lower  respiratory specimens during the acute phase of  infection. The lowest  concentration of SARS-CoV-2 viral copies this assay can detect is 250  copies / mL. A negative result does not preclude SARS-CoV-2 infection  and should not be used as the sole basis for treatment or other  patient management decisions.  A negative result may occur with  improper specimen collection / handling, submission of specimen other  than nasopharyngeal swab, presence of viral mutation(s) within the  areas targeted by this assay, and inadequate number of viral copies  (<250 copies / mL). A negative result must be combined with clinical  observations, patient history, and epidemiological information. If result is POSITIVE SARS-CoV-2 target nucleic acids are DETECTED.  The SARS-CoV-2 RNA is generally detectable in upper and lower  respiratory specimens during the acute phase of infection.  Positive  results are indicative of active infection with SARS-CoV-2.  Clinical  correlation with patient history and other diagnostic information is  necessary to determine patient infection status.  Positive results do  not rule out bacterial infection or co-infection with other viruses. If result is PRESUMPTIVE POSTIVE SARS-CoV-2 nucleic acids MAY BE PRESENT.   A presumptive positive result was obtained on the submitted specimen  and confirmed on repeat testing.  While 2019 novel coronavirus  (SARS-CoV-2) nucleic acids may be present in the submitted sample  additional confirmatory testing may be necessary for epidemiological  and / or clinical management purposes  to differentiate between  SARS-CoV-2 and other Sarbecovirus currently known to infect humans.  If clinically indicated additional testing with an alternate test  methodology [phone])  is advised. The SARS-CoV-2 RNA is generally  detectable in upper and lower respiratory specimens  during the acute  phase of infection. The expected result is Negative. Fact Sheet for Patients:  StrictlyIdeas.no Fact Sheet for Healthcare Providers: BankingDealers.co.za This test is not yet approved or cleared by the [place] FDA and has been authorized for detection and/or diagnosis of SARS-CoV-2 by FDA under an Emergency Use Authorization (EUA).  This EUA will remain in effect (meaning this test can be used) for the duration of the COVID-19 declaration under Section 564(b)(1) of the Act, 21 U.S.C. section 360bbb-3(b)(1), unless the authorization is terminated or revoked sooner. Performed at [facility], [place] [address] [place], [place] [address]   Urine Culture     Status: Abnormal   Collection Time: 07/18/19  5:19 AM   Specimen: Urine, Clean Catch  Result Value Ref Range Status   Specimen Description URINE, CLEAN CATCH  Final   Special Requests   Final    NONE Performed at [facility], [place] [address], [place], [place] [address]    Culture MULTIPLE SPECIES PRESENT, SUGGEST RECOLLECTION (A)  Final   Report Status 07/19/2019 FINAL  Final    Radiology Reports Dg Chest 2 View  Result Date: 07/17/2019 CLINICAL DATA:  Lower extremity edema EXAM: CHEST - 2 VIEW COMPARISON:  June 08, 2019 FINDINGS: Heart size is enlarged. There is generalized volume overload with possible early pulmonary edema. There appears to be a more focal opacity at the right lung base which may represent atelectasis or pneumonia. The lateral view is somewhat limited by motion and patient body habitus. There is no pneumothorax. IMPRESSION: 1. Cardiomegaly with mild volume overload. 2. Opacity at the right lung base may represent atelectasis or pneumonia. Electronically Signed   By: Constance Holster M.D.   On: 07/17/2019 23:02   Dg Ankle 2 Views Right  Result Date: 07/18/2019 CLINICAL DATA:  Bilateral  lower extremity edema. EXAM: RIGHT ANKLE - 2  VIEW COMPARISON:  None. FINDINGS: There is no evidence of fracture, dislocation, or joint effusion. Prominent Achilles tendon enthesophyte. There is no evidence of arthropathy or other focal bone abnormality. Generalized soft tissue edema. IMPRESSION: Generalized soft tissue edema. No acute osseous abnormality. Electronically Signed   By: Keith Rake M.D.   On: 07/18/2019 02:32   Dg Ankle Complete Left  Result Date: 07/18/2019 CLINICAL DATA:  Bilateral lower extremity edema. EXAM: LEFT ANKLE COMPLETE - 3+ VIEW COMPARISON:  None. FINDINGS: There is no evidence of fracture, dislocation, or joint effusion. There is no evidence of arthropathy or other focal bone abnormality. Achilles tendon enthesophyte. Generalized soft tissue edema. IMPRESSION: Generalized soft tissue edema. No osseous abnormality. Electronically Signed   By: Keith Rake M.D.   On: 07/18/2019 02:31   Vas [place] Lower Extremity Venous (dvt)  Result Date: 07/18/2019  Lower Venous Study Indications: Edema.  Limitations: Poor ultrasound/tissue interface and body habitus. Comparison Study: no prior Performing Technologist: Abram Sander RVS  Examination Guidelines: A complete evaluation includes B-mode imaging, spectral Doppler, color Doppler, and power Doppler as needed of all accessible portions of each vessel. Bilateral testing is considered an integral part of a complete examination. Limited examinations for reoccurring indications may be performed as noted.  +---------+---------------+---------+-----------+----------+--------------+  RIGHT     Compressibility Phasicity Spontaneity Properties Thrombus Aging  +---------+---------------+---------+-----------+----------+--------------+  CFV       Full            Yes       Yes                                    +---------+---------------+---------+-----------+----------+--------------+  SFJ       Full                                                              +---------+---------------+---------+-----------+----------+--------------+  FV Prox   Full                                                             +---------+---------------+---------+-----------+----------+--------------+  FV Mid    Full                                                             +---------+---------------+---------+-----------+----------+--------------+  FV Distal Full                                                             +---------+---------------+---------+-----------+----------+--------------+  PFV       Full                                                             +---------+---------------+---------+-----------+----------+--------------+  POP       Full            Yes       Yes                                    +---------+---------------+---------+-----------+----------+--------------+  PTV       Full                                                             +---------+---------------+---------+-----------+----------+--------------+  PERO      Full                                                             +---------+---------------+---------+-----------+----------+--------------+   +---------+---------------+---------+-----------+----------+--------------+  LEFT      Compressibility Phasicity Spontaneity Properties Thrombus Aging  +---------+---------------+---------+-----------+----------+--------------+  CFV       Full            Yes       Yes                                    +---------+---------------+---------+-----------+----------+--------------+  SFJ       Full                                                             +---------+---------------+---------+-----------+----------+--------------+  FV Prox   Full                                                             +---------+---------------+---------+-----------+----------+--------------+  FV Mid    Full                                                              +---------+---------------+---------+-----------+----------+--------------+  FV Distal Full                                                             +---------+---------------+---------+-----------+----------+--------------+  PFV       Full                                                             +---------+---------------+---------+-----------+----------+--------------+  POP       Full            Yes       Yes                                    +---------+---------------+---------+-----------+----------+--------------+  PTV       Full                                                             +---------+---------------+---------+-----------+----------+--------------+  PERO                                                       Not visualized  +---------+---------------+---------+-----------+----------+--------------+     Summary: Right: There is no evidence of deep vein thrombosis in the lower extremity. No cystic structure found in the popliteal fossa. Left: There is no evidence of deep vein thrombosis in the lower extremity. No cystic structure found in the popliteal fossa.  *See table(s) above for measurements and observations. Electronically signed by Monica Martinez MD on 07/18/2019 at 3:07:22 PM.    Final

## 2019-07-21 NOTE — Progress Notes (Signed)
Pt's family called and no answer. Voicemail left to return call.

## 2019-07-21 NOTE — Plan of Care (Signed)
Patient breathing comfortably on room air. Patient continues to have BLE edema. Patient educated on diet and lifestyle modification in the setting of CHF. Patient verbalized understanding.

## 2019-07-21 NOTE — Progress Notes (Signed)
PT Cancellation Note  Patient Details Name: Kristen Shaffer MRN: [id number] DOB: 1956/10/06   Cancelled Treatment:    Reason Eval/Treat Not Completed: Patient at procedure or test/unavailable  Attempted to see pt for stair training. Patient speaking with her wireless carrier in a heated conversation. As PT moved about room prepping for session, she in no way acknowledged my presence and continued her conversation on the phone. After 5 minutes, PT exited the room as she did not appear any closer to getting off the phone.   [id number]    Barry Brunner, PT      Jeanie Cooks Jillane Po 07/21/2019, 9:15 AM

## 2019-07-21 NOTE — TOC Initial Note (Signed)
Transition of Care [facility]) - Initial/Assessment Note    Patient Details  Name: Kristen Shaffer MRN: [id number] Date of Birth: 05-06-1956  Transition of Care [facility]) CM/SW Contact:    Ninfa Meeker, RN Phone Number:[phone] (working remotely) 07/21/2019, 1:47 PM  Clinical Narrative:  62 yr old female admitted and being  treated for  COVID 19. Case manager spoke with patient concerning discharge plan. Patient is requesting PT/OT, actually wants to go outpatient, CM explained that outpatient facilities are not currerntly accepting COVID patients so she wants inhouse. Choice for [facility] agency offererd, referral called to Adela Lank, [facility] Liaison. CM following for DME needs.                    Patient Goals and CMS Choice Patient states their goals for this hospitalization and ongoing recovery are:: To get better and return home. CMS Medicare.gov Compare Post Acute Care list provided to:: Patient    Expected Discharge Plan and Services Expected Discharge Plan: Home/Self Care   Discharge Planning Services: CM Consult Post Acute Care Choice: Home Health   Expected Discharge Date: 07/22/19                         HH Arranged: PT, OT HH Agency: [place] Date [facility] Agency Contacted: 07/20/19 Time HH Agency Contacted: [id number] Representative spoke with at [place]: Adela Lank  Prior Living Arrangements/Services   Lives with:: Self Patient language and need for interpreter reviewed:: Yes Do you feel safe going back to the place where you live?: Yes      Need for Family Participation in Patient Care: Yes (Comment) Care giver support system in place?: Yes (comment)   Criminal Activity/Legal Involvement Pertinent to Current Situation/Hospitalization: No - Comment as needed  Activities of Daily Living Home Assistive Devices/Equipment: Walker (specify type) ADL Screening (condition at time of admission) Patient's cognitive ability adequate to safely  complete daily activities?: No Is the patient deaf or have difficulty hearing?: No Does the patient have difficulty seeing, even when wearing glasses/contacts?: No Does the patient have difficulty concentrating, remembering, or making decisions?: No Patient able to express need for assistance with ADLs?: Yes Does the patient have difficulty dressing or bathing?: No Independently performs ADLs?: Yes (appropriate for developmental age) Does the patient have difficulty walking or climbing stairs?: Yes Weakness of Legs: Both Weakness of Arms/Hands: None  Permission Sought/Granted         Permission granted to share info w AGENCY: [facility]        Emotional Assessment Appearance:: Appears stated age Attitude/Demeanor/Rapport: Gracious   Orientation: : Oriented to Self, Oriented to Place, Oriented to  Time, Oriented to Situation Alcohol / Substance Use: Not Applicable Psych Involvement: No (comment)  Admission diagnosis:  Leg swelling [M79.89] COVID-19 virus detected [U07.1] COVID-19 virus infection [U07.1] Patient Active Problem List   Diagnosis Date Noted  . Leg swelling   . Morbid obesity ([place]) 06/11/2019  . Weakness   . Acute respiratory failure with hypoxia ([place]) 06/09/2019  . COVID-19 virus infection 06/08/2019  . GERD (gastroesophageal reflux disease)   . UTI (urinary tract infection)   . AKI (acute kidney injury) ([place])   . Arthritis   . Hypertension   . Depression   . Fibroid    PCP:  Nolene Ebbs, MD Pharmacy:   [facility] # [address], [place]  [place] [address] Phone: [phone] Fax: [phone]     Social Determinants of Health (SDOH) Interventions    Readmission Risk Interventions No flowsheet data found.

## 2019-07-21 NOTE — Progress Notes (Signed)
Physical Therapy Treatment Patient Details Name: Kristen Shaffer MRN: [id number] DOB: Apr 30, 1956 Today's Date: 07/21/2019    History of Present Illness Kristen Shaffer is a 63 y.o. female with history of hypertension, morbid obesity presents to the ER 8/31  because of increasing bilateral lower extremity edema.Patient was admitted [place] with COVID -19 pneumonia and UTI in july, [place] to [facility] 7/30. Reports persistent dysuria , has a chronic shortness of breath.  Negative for DVT-bilateral legs. Patient remains with positive Covid test..    PT Comments    Patient continues to refuse follow-up therapy at SNF (recommended for further strengthening and stair training). She reports her daughter can assist her up her 4 steps with no rail when she discharges tomorrow. She stepped up/down  one single 8" step with significant bil UE support (counter on one side, hand grip of stabilized RW on the other side). Without a ramp, entering and exiting her home put her at a high risk for falling. She continues to refuse SNF due to recent poor experience.    Follow Up Recommendations  No PT follow up(due to pt refusing SNF or HH)     Equipment Recommendations  None recommended by PT    Recommendations for Other Services       Precautions / Restrictions Precautions Precautions: Fall Precaution Comments: fluid restriction, on lasix    Mobility  Bed Mobility Overal bed mobility: Modified Independent Bed Mobility: Supine to Sit     Supine to sit: Modified independent (Device/Increase time)        Transfers Overall transfer level: Modified independent Equipment used: Rolling walker (2 wheeled) Transfers: Sit to/from Stand Sit to Stand: Modified independent (Device/Increase time)         General transfer comment: from bed; from toilet x 2 using grab bar  Ambulation/Gait Ambulation/Gait assistance: Supervision Gait Distance (Feet): 22 Feet(to toilet; 10; back to toilet; 22 feet ) Assistive  device: Rolling walker (2 wheeled) Gait Pattern/deviations: Decreased step length - right;Decreased step length - left;Step-through pattern;Shuffle;Trunk flexed     General Gait Details: reports incr back pain and knee pain with walking   Stairs Stairs: Yes Stairs assistance: Min guard Stair Management: Two rails;Step to pattern;Forwards;With walker Number of Stairs: 1 General stair comments: 8" single step/curb with counter on her left and walker stabilized by PT on her rt stepped up, stepped across and then put both hands on RW to step down; pt reports when she goes up her steps, her daughter is at her side and holds her hand   Wheelchair Mobility    Modified Rankin (Stroke Patients Only)       Balance                                            Cognition Arousal/Alertness: Awake/alert Behavior During Therapy: WFL for tasks assessed/performed Overall Cognitive Status: Within Functional Limits for tasks assessed                                        Exercises      General Comments General comments (skin integrity, edema, etc.): Patient reporting pain in all her joints and sciatic pain. Educated pt in benefit of ROM/movement for all joints to decr arthritis pain. She acknowledges that due to depression and illness  she has not been moving very much.       Pertinent Vitals/Pain Pain Assessment: Faces Faces Pain Scale: Hurts little more Pain Location: L knee, back Pain Descriptors / Indicators: Constant;Burning;Sore Pain Intervention(s): Limited activity within patient's tolerance;Monitored during session    Kristen Shaffer expects to be discharged to:: Private residence Living Arrangements: Alone Available Help at Discharge: Family;Available PRN/intermittently Type of Home: Apartment Home Access: Stairs to enter Entrance Stairs-Rails: None Home Layout: One level Home Equipment: Environmental consultant - 4 wheels(3 wheeled RW)       Prior Function Level of Independence: Needs assistance  Gait / Transfers Assistance Needed: Modified independent household distances with rollator. ADL's / Homemaking Assistance Needed: Daughter does grocery shopping      PT Goals (current goals can now be found in the care plan section) Acute Rehab PT Goals Patient Stated Goal: to go home Time For Goal Achievement: 08/02/19 Potential to Achieve Goals: Good Progress towards PT goals: Progressing toward goals    Frequency    Min 3X/week      PT Plan Other (comment)(patient refuses plan to return to [facility] or for HHPT)    Co-evaluation              AM-PAC PT "6 Clicks" Mobility   Outcome Measure  Help needed turning from your back to your side while in a flat bed without using bedrails?: None Help needed moving from lying on your back to sitting on the side of a flat bed without using bedrails?: None Help needed moving to and from a bed to a chair (including a wheelchair)?: None Help needed standing up from a chair using your arms (e.g., wheelchair or bedside chair)?: None Help needed to walk in hospital room?: None Help needed climbing 3-5 steps with a railing? : A Lot 6 Click Score: 22    End of Session   Activity Tolerance: Patient tolerated treatment well Patient left: in bed;with call bell/phone within reach Nurse Communication: Mobility status PT Visit Diagnosis: Other abnormalities of gait and mobility (R26.89);Unsteadiness on feet (R26.81);Muscle weakness (generalized) (M62.81)     Time: [id number] PT Time Calculation (min) (ACUTE ONLY): 37 min  Charges:  $Gait Training: 23-37 mins                       Barry Brunner, PT       Rexanne Mano 07/21/2019, 4:51 PM

## 2019-07-22 LAB — BASIC METABOLIC PANEL
Anion gap: 9 (ref 5–15)
BUN: 17 mg/dL (ref 8–23)
CO2: 29 mmol/L (ref 22–32)
Calcium: 9.1 mg/dL (ref 8.9–10.3)
Chloride: 101 mmol/L (ref 98–111)
Creatinine, Ser: 0.86 mg/dL (ref 0.44–1.00)
GFR calc Af Amer: >60 mL/min (ref >60)
GFR calc non Af Amer: >60 mL/min (ref >60)
Glucose, Bld: 139 mg/dL — ABNORMAL HIGH (ref 70–99)
Potassium: 3.4 mmol/L — ABNORMAL LOW (ref 3.5–5.1)
Sodium: 139 mmol/L (ref 135–145)

## 2019-07-22 LAB — BRAIN NATRIURETIC PEPTIDE: B Natriuretic Peptide: 20.3 pg/mL (ref 0.0–100.0)

## 2019-07-22 LAB — MAGNESIUM: Magnesium: 2.1 mg/dL (ref 1.7–2.4)

## 2019-07-22 MED ORDER — DOXYCYCLINE HYCLATE 100 MG PO TABS: 100.0000 mg | ORAL_TABLET | Freq: Two times a day (BID) | ORAL | 0 refills | Status: DC

## 2019-07-22 MED ORDER — POTASSIUM CHLORIDE CRYS ER 20 MEQ PO TBCR
40.0000 meq | EXTENDED_RELEASE_TABLET | Freq: Once | ORAL | Status: AC
  Administered 2019-07-22: 40 meq via ORAL
  Filled 2019-07-22: qty 2

## 2019-07-22 MED ORDER — CARVEDILOL 6.25 MG PO TABS: 6.2500 mg | ORAL_TABLET | Freq: Two times a day (BID) | ORAL | 0 refills | Status: DC

## 2019-07-22 MED ORDER — SPIRONOLACTONE 25 MG PO TABS: 25.0000 mg | ORAL_TABLET | Freq: Every day | ORAL | 0 refills | Status: DC

## 2019-07-22 MED ORDER — AMLODIPINE BESYLATE 10 MG PO TABS: 10.0000 mg | ORAL_TABLET | Freq: Every day | ORAL | 0 refills | Status: DC

## 2019-07-22 MED ORDER — GABAPENTIN 300 MG PO CAPS: 300.0000 mg | ORAL_CAPSULE | Freq: Three times a day (TID) | ORAL | 0 refills | Status: DC

## 2019-07-22 MED ORDER — FUROSEMIDE 40 MG PO TABS: 40.0000 mg | ORAL_TABLET | Freq: Every day | ORAL | 0 refills | Status: DC

## 2019-07-22 MED ORDER — AMLODIPINE BESYLATE 5 MG PO TABS
10.0000 mg | ORAL_TABLET | Freq: Every day | ORAL | Status: DC
  Administered 2019-07-22: 10 mg via ORAL
  Filled 2019-07-22: qty 2

## 2019-07-22 MED ORDER — POLYETHYLENE GLYCOL 3350 17 G PO PACK: 17.0000 g | PACK | Freq: Every day | ORAL | 0 refills | Status: DC | PRN

## 2019-07-22 NOTE — Progress Notes (Addendum)
1028  Patient and daughter Kristen Shaffer aware of discharge plans.  Kristen Shaffer will provide transport at 11 am.    1145  Discharge instructions reviewed with patient.  All questions/concerns addressed.  IV removed intact.  And after breakfast and am care complete she is ready for discharge.  Daughter, Kristen Shaffer at exit.

## 2019-07-22 NOTE — Discharge Summary (Signed)
Kristen Shaffer [id number] DOB: May 23, 1956 DOA: 07/17/2019  PCP: Nolene Ebbs, MD  Admit date: 07/17/2019  Discharge date: 07/22/2019  Admitted From: Home   Disposition:  Home   Recommendations for Outpatient Follow-up:   Follow up with PCP in 1-2 weeks  PCP Please obtain BMP/CBC, 2 view CXR in 1week,  (see Discharge instructions)   PCP Please follow up on the following pending results: outpt Psych and Cards follow up, monitor BMP closely   Home Health: PT,RN   Equipment/Devices: walker, 3 in 1  Consultations: None  Discharge Condition: Stable    CODE STATUS: Full    Diet Recommendation: Heart Healthy , 1.5 lit/day fluid restriction    Chief Complaint  Patient presents with   Leg Swelling     Brief history of present illness from the day of admission and additional interim summary    Kristen Shaffer a 63 y.o.femalewithhistory of hypertension, morbid obesity presents to the ER because of increasing bilateral lower extremity edema ongoing for last 2 days with some erythema and pain. Patient also has been a chronic shortness of breath. Patient was admitted last month and discharged on July 30, 2020after being treated for COVID-19 pneumonia, she presented now with lower extremity swelling and was admitted for possible cellulitis.                                                                 [facility] Course    Note on   07/21/19 - patient had 2 pages full of questions which were answered over a period of 40 minutes in the room.  On 9/4 -patient again had written about 10 questions and her mother was also on the cell phone, spend another 20 minutes in the room answering questions.  Patient had incredible pressure of speech as per other staff throughout her stay.  Please have outpatient psych follow  the patient.    1.  Covid 19 Viral Infection - was diagnosed several weeks ago and this is not an active issue.  No pulmonary problems.  COVID-19 Labs  No results for input(s): DDIMER, FERRITIN, LDH, CRP in the last 72 hours.  Lab Results  Component Value Date   SARSCOV2NAA POSITIVE (A) 07/18/2019   SARSCOV2NAA DETECTED (A) 06/13/2019   SARSCOV2NAA POSITIVE (A) 06/08/2019    2.  Mild bilateral left greater than right lower extremity cellulitis with 2+ edema.  Large element is of edema causing erythema, she was never febrile and legs were never warm, she did have mild leukocytosis hence mild cellulitis cannot be completely ruled out.  She has been treated with Lasix, TED stockings, fluid restriction along with oral doxycycline with excellent improvement.  Continue present line of care.  No DVT in lower extremities.  In my opinion her presentation was more consistent with  peripheral edema causing skin erythema. Will give 3 more days of PO Doxy, redness almost resolved.  3.  Acute on chronic diastolic CHF EF [id number].  Continue combination of Lasix and beta-blocker which has been started here, outpatient cardiology follow-up.  4.  Hypertension.  Stable on beta-blocker and diuretic regimen.  5.  Underlying history of depression, bipolar disorder, pressure of speech.  Outpatient psych follow-up.    Not suicidal or homicidal..  6.  Morbid obesity BMI of 44.  Follow with PCP for weight loss.  7.  Chronic back pain and deconditioning.  PT following, patient wants to go home health PT which has been arranged along with DME.  8.  Constipation.  Placed on bowel regimen with good results.   Discharge diagnosis     Principal Problem:   COVID-19 virus infection Active Problems:   Hypertension   UTI (urinary tract infection)   Acute respiratory failure with hypoxia (HCC)   Morbid obesity Kristen Shaffer)    Discharge instructions    Discharge Instructions    Discharge instructions   Complete  by: As directed    Follow with Primary MD Nolene Ebbs, MD in 7 days   Get CBC, CMP, 2 view Chest X ray -  checked next visit within 1 week by Primary MD   Activity: As tolerated with Full fall precautions use walker/cane & assistance as needed  Disposition Home   Diet: Heart Healthy. Check your Weight same time everyday, if you gain over 2 pounds, or you develop in leg swelling, experience more shortness of breath or chest pain, call your Primary MD immediately. Follow Cardiac Low Salt Diet and 1.5 lit/day fluid restriction.  Special Instructions: If you have smoked or chewed Tobacco  in the last 2 yrs please stop smoking, stop any regular Alcohol  and or any Recreational drug use.  On your next visit with your primary care physician please Get Medicines reviewed and adjusted.  Please request your Prim.MD to go over all [facility] Tests and Procedure/Radiological results at the follow up, please get all [facility] records sent to your Prim MD by signing [facility] release before you go home.  If you experience worsening of your admission symptoms, develop shortness of breath, life threatening emergency, suicidal or homicidal thoughts you must seek medical attention immediately by calling 911 or calling your MD immediately  if symptoms less severe.  You Must read complete instructions/literature along with all the possible adverse reactions/side effects for all the Medicines you take and that have been prescribed to you. Take any new Medicines after you have completely understood and accpet all the possible adverse reactions/side effects.   Increase activity slowly   Complete by: As directed       Discharge Medications   Allergies as of 07/22/2019   No Known Allergies     Medication List    STOP taking these medications   ciprofloxacin 500 MG tablet Commonly known as: Cipro     TAKE these medications   acetaminophen 650 MG CR tablet Commonly known as: TYLENOL Take 650 mg by mouth  every 8 (eight) hours as needed for pain.   amLODipine 10 MG tablet Commonly known as: NORVASC Take 1 tablet (10 mg total) by mouth daily.   carvedilol 6.25 MG tablet Commonly known as: COREG Take 1 tablet (6.25 mg total) by mouth 2 (two) times daily with a meal.   CELEBREX PO Take 1 tablet by mouth daily as needed (pain).   doxycycline 100 MG tablet Commonly  known as: VIBRA-TABS Take 1 tablet (100 mg total) by mouth every 12 (twelve) hours.   furosemide 40 MG tablet Commonly known as: Lasix Take 1 tablet (40 mg total) by mouth daily.   gabapentin 300 MG capsule Commonly known as: NEURONTIN Take 1 capsule (300 mg total) by mouth 3 (three) times daily.   polyethylene glycol 17 g packet Commonly known as: MIRALAX / GLYCOLAX Take 17 g by mouth daily as needed.   spironolactone 25 MG tablet Commonly known as: Aldactone Take 1 tablet (25 mg total) by mouth daily.   zinc gluconate 50 MG tablet Take 50 mg by mouth daily.            Durable Medical Equipment  (From admission, onward)         Start     Ordered   07/21/19 0907  For home use only DME 3 n 1  Once     07/21/19 0906   07/21/19 0907  For home use only DME Walker rolling  Once    Comments: 5 wheel  Question:  Patient needs a walker to treat with the following condition  Answer:  Weakness   07/21/19 0906          Follow-up Information    [facility] Follow up.   Why: please follow up as needed Contact information: [place] [id number] [place]: Catering manager information: [facility] [place] [address] [place], [facility] Follow up.   Specialty: [place] Why: A representative from [facility] will contact you to arrange start date and time for your therapy. Contact information: [place] [address] [phone]        Nolene Ebbs, MD. Schedule an appointment as soon as possible for a visit in 1 week(s).   Specialty: Internal Medicine Contact information: [employer] [place] [address] [phone]        Skeet Latch, MD. Schedule an appointment as soon as possible for a visit in 1 month(s).   Specialty: Cardiology Why: [facility] Contact information: [address] [place] [address] [phone]           Major procedures and Radiology Reports - PLEASE review detailed and final reports thoroughly  -     TTE  1. The left ventricle has normal systolic function, with an ejection fraction of 55-60%. The cavity size was normal. There is mildly increased left ventricular wall thickness. Left ventricular diastolic Doppler parameters are consistent with impaired  relaxation. No evidence of left ventricular regional wall motion abnormalities. 2. The aortic root is normal in size and structure. 3. The aortic valve is tricuspid. Mild calcification of the aortic valve. No stenosis of the aortic valve. 4. The right ventricle has normal systolic function. The cavity was normal. There is no increase in right ventricular wall thickness. 5. Trivial pericardial effusion is present. 6. No evidence of mitral valve stenosis. No mitral regurgitation. 7. The IVC was poorly visualized. Peak RV-RA gradient 26 mmHg. 8. Technically difficult study with poor acoustic windows.    Lower extremity venous duplex - no DVT.    Dg Chest 2 View  Result Date: 07/17/2019 CLINICAL DATA:  Lower extremity edema EXAM: CHEST - 2 VIEW COMPARISON:  June 08, 2019 FINDINGS: Heart  size is enlarged. There is generalized volume overload with possible early pulmonary edema. There appears to be a more focal opacity at the right lung base which may represent atelectasis or pneumonia. The lateral view is somewhat limited by motion and patient body  habitus. There is no pneumothorax. IMPRESSION: 1. Cardiomegaly with mild volume overload. 2. Opacity at the right lung base may represent atelectasis or pneumonia. Electronically Signed   By: Constance Holster M.D.   On: 07/17/2019 23:02   Dg Ankle 2 Views Right  Result Date: 07/18/2019 CLINICAL DATA:  Bilateral lower extremity edema. EXAM: RIGHT ANKLE - 2 VIEW COMPARISON:  None. FINDINGS: There is no evidence of fracture, dislocation, or joint effusion. Prominent Achilles tendon enthesophyte. There is no evidence of arthropathy or other focal bone abnormality. Generalized soft tissue edema. IMPRESSION: Generalized soft tissue edema. No acute osseous abnormality. Electronically Signed   By: Keith Rake M.D.   On: 07/18/2019 02:32   Dg Ankle Complete Left  Result Date: 07/18/2019 CLINICAL DATA:  Bilateral lower extremity edema. EXAM: LEFT ANKLE COMPLETE - 3+ VIEW COMPARISON:  None. FINDINGS: There is no evidence of fracture, dislocation, or joint effusion. There is no evidence of arthropathy or other focal bone abnormality. Achilles tendon enthesophyte. Generalized soft tissue edema. IMPRESSION: Generalized soft tissue edema. No osseous abnormality. Electronically Signed   By: Keith Rake M.D.   On: 07/18/2019 02:31   Vas [place] Lower Extremity Venous (dvt)  Result Date: 07/18/2019  Lower Venous Study Indications: Edema.  Limitations: Poor ultrasound/tissue interface and body habitus. Comparison Study: no prior Performing Technologist: Abram Sander RVS  Examination Guidelines: A complete evaluation includes B-mode imaging, spectral Doppler, color Doppler, and power Doppler as needed of all accessible portions of each vessel. Bilateral testing is considered an integral part of a complete examination. Limited examinations for reoccurring indications may be performed as noted.  +---------+---------------+---------+-----------+----------+--------------+  RIGHT      Compressibility Phasicity Spontaneity Properties Thrombus Aging  +---------+---------------+---------+-----------+----------+--------------+  CFV       Full            Yes       Yes                                    +---------+---------------+---------+-----------+----------+--------------+  SFJ       Full                                                             +---------+---------------+---------+-----------+----------+--------------+  FV Prox   Full                                                             +---------+---------------+---------+-----------+----------+--------------+  FV Mid    Full                                                             +---------+---------------+---------+-----------+----------+--------------+  FV Distal Full                                                             +---------+---------------+---------+-----------+----------+--------------+  PFV       Full                                                             +---------+---------------+---------+-----------+----------+--------------+  POP       Full            Yes       Yes                                    +---------+---------------+---------+-----------+----------+--------------+  PTV       Full                                                             +---------+---------------+---------+-----------+----------+--------------+  PERO      Full                                                             +---------+---------------+---------+-----------+----------+--------------+   +---------+---------------+---------+-----------+----------+--------------+  LEFT      Compressibility Phasicity Spontaneity Properties Thrombus Aging  +---------+---------------+---------+-----------+----------+--------------+  CFV       Full            Yes       Yes                                    +---------+---------------+---------+-----------+----------+--------------+  SFJ       Full                                                              +---------+---------------+---------+-----------+----------+--------------+  FV Prox   Full                                                             +---------+---------------+---------+-----------+----------+--------------+  FV Mid    Full                                                             +---------+---------------+---------+-----------+----------+--------------+  FV Distal Full                                                             +---------+---------------+---------+-----------+----------+--------------+  PFV       Full                                                             +---------+---------------+---------+-----------+----------+--------------+  POP       Full            Yes       Yes                                    +---------+---------------+---------+-----------+----------+--------------+  PTV       Full                                                             +---------+---------------+---------+-----------+----------+--------------+  PERO                                                       Not visualized  +---------+---------------+---------+-----------+----------+--------------+     Summary: Right: There is no evidence of deep vein thrombosis in the lower extremity. No cystic structure found in the popliteal fossa. Left: There is no evidence of deep vein thrombosis in the lower extremity. No cystic structure found in the popliteal fossa.  *See table(s) above for measurements and observations. Electronically signed by Monica Martinez MD on 07/18/2019 at 6:07:22 PM.    Final     Micro Results     Recent Results (from the past 240 hour(s))  Urine Culture     Status: Abnormal   Collection Time: 07/13/19  4:39 PM   Specimen: Urine  Result Value Ref Range Status   MICRO NUMBER: [id number]  Final   SPECIMEN QUALITY: Adequate  Final   Sample Source URINE  Final   STATUS: FINAL  Final   ISOLATE 1: Escherichia coli (A)  Final    Comment: Greater than  100,000 CFU/mL of Escherichia coli      Susceptibility   Escherichia coli - URINE CULTURE, REFLEX    AMOX/CLAVULANIC <=2 Sensitive     AMPICILLIN 4 Sensitive     AMPICILLIN/SULBACTAM <=2 Sensitive     CEFAZOLIN* <=4 Not Reportable      * For infections other than uncomplicated UTIcaused by E. coli, K. pneumoniae or P. mirabilis:Cefazolin is resistant if MIC > or = 8 mcg/mL.(Distinguishing susceptible versus intermediatefor isolates with MIC < or = 4 mcg/mL requiresadditional testing.)For uncomplicated UTI caused by E. coli,K. pneumoniae or P. mirabilis: Cefazolin issusceptible if MIC <32 mcg/mL and predictssusceptible to the oral agents cefaclor, cefdinir,cefpodoxime, cefprozil, cefuroxime,  cephalexinand loracarbef.    CEFEPIME <=1 Sensitive     CEFTRIAXONE <=1 Sensitive     CIPROFLOXACIN <=0.25 Sensitive     LEVOFLOXACIN <=0.12 Sensitive     ERTAPENEM <=0.5 Sensitive     GENTAMICIN <=1 Sensitive     IMIPENEM <=0.25 Sensitive     NITROFURANTOIN <=16 Sensitive     PIP/TAZO <=4 Sensitive     TOBRAMYCIN <=1 Sensitive     TRIMETH/SULFA* <=20 Sensitive      * For infections other than uncomplicated UTIcaused by E. coli, K. pneumoniae or P. mirabilis:Cefazolin is resistant if MIC > or = 8 mcg/mL.(Distinguishing susceptible versus intermediatefor isolates with MIC < or = 4 mcg/mL requiresadditional testing.)For uncomplicated UTI caused by E. coli,K. pneumoniae or P. mirabilis: Cefazolin issusceptible if MIC <32 mcg/mL and predictssusceptible to the oral agents cefaclor, cefdinir,cefpodoxime, cefprozil, cefuroxime, cephalexinand loracarbef.Legend:S = Susceptible  I = IntermediateR = Resistant  NS = Not susceptible* = Not tested  NR = Not reported**NN = See antimicrobic comments  SARS Coronavirus 2 [facility] order, Performed in [facility] lab) Nasopharyngeal Nasopharyngeal Swab     Status: Abnormal   Collection Time: 07/18/19  2:10 AM   Specimen: Nasopharyngeal Swab  Result Value Ref Range  Status   SARS Coronavirus 2 POSITIVE (A) NEGATIVE Final    Comment: RESULT CALLED TO, READ BACK BY AND VERIFIED WITH: B. BAILIFF,RN [id number] 07/18/2019 T. TYSOR (NOTE) If result is NEGATIVE SARS-CoV-2 target nucleic acids are NOT DETECTED. The SARS-CoV-2 RNA is generally detectable in upper and lower  respiratory specimens during the acute phase of infection. The lowest  concentration of SARS-CoV-2 viral copies this assay can detect is 250  copies / mL. A negative result does not preclude SARS-CoV-2 infection  and should not be used as the sole basis for treatment or other  patient management decisions.  A negative result may occur with  improper specimen collection / handling, submission of specimen other  than nasopharyngeal swab, presence of viral mutation(s) within the  areas targeted by this assay, and inadequate number of viral copies  (<250 copies / mL). A negative result must be combined with clinical  observations, patient history, and epidemiological information. If result is POSITIVE SARS-CoV-2 target nucleic acids are DETECTED.  The SARS-CoV-2 RNA is generally detectable in upper and lower  respiratory specimens during the acute phase of infection.  Positive  results are indicative of active infection with SARS-CoV-2.  Clinical  correlation with patient history and other diagnostic information is  necessary to determine patient infection status.  Positive results do  not rule out bacterial infection or co-infection with other viruses. If result is PRESUMPTIVE POSTIVE SARS-CoV-2 nucleic acids MAY BE PRESENT.   A presumptive positive result was obtained on the submitted specimen  and confirmed on repeat testing.  While 2019 novel coronavirus  (SARS-CoV-2) nucleic acids may be present in the submitted sample  additional confirmatory testing may be necessary for epidemiological  and / or clinical management purposes  to differentiate between  SARS-CoV-2 and other Sarbecovirus  currently known to infect humans.  If clinically indicated additional testing with an alternate test  methodology [phone])  is advised. The SARS-CoV-2 RNA is generally  detectable in upper and lower respiratory specimens during the acute  phase of infection. The expected result is Negative. Fact Sheet for Patients:  StrictlyIdeas.no Fact Sheet for Healthcare Providers: BankingDealers.co.za This test is not yet approved or cleared by the [place] FDA and has been authorized for detection and/or  diagnosis of SARS-CoV-2 by FDA under an Emergency Use Authorization (EUA).  This EUA will remain in effect (meaning this test can be used) for the duration of the COVID-19 declaration under Section 564(b)(1) of the Act, 21 U.S.C. section 360bbb-3(b)(1), unless the authorization is terminated or revoked sooner. Performed at [facility], [place] [address], [place], [place] [address]   Urine Culture     Status: Abnormal   Collection Time: 07/18/19  5:19 AM   Specimen: Urine, Clean Catch  Result Value Ref Range Status   Specimen Description URINE, CLEAN CATCH  Final   Special Requests   Final    NONE Performed at [facility], [place] [address], [place], [place] [address]    Culture MULTIPLE SPECIES PRESENT, SUGGEST RECOLLECTION (A)  Final   Report Status 07/19/2019 FINAL  Final    Today   Subjective    Saki Gasparyan today has no headache,no chest abdominal pain,no new weakness tingling or numbness, feels much better wants to go home today.     Objective   Blood pressure (!) 144/81, pulse 95, temperature 97.7 F (36.5 C), temperature source Oral, resp. rate 20, height 5\' 10"  (1.778 m), weight (!) 137.2 kg, SpO2 97 %.   Intake/Output Summary (Last 24 hours) at 07/22/2019 0854 Last data filed at 07/21/2019 1700 Gross per 24 hour  Intake --  Output 700 ml  Net -700 ml    Exam Awake Alert, Oriented x 3, No new F.N deficits,  Normal affect [place].AT,PERRAL Supple Neck,No JVD, No cervical lymphadenopathy appriciated.  Symmetrical Chest wall movement, Good air movement bilaterally, CTAB RRR,No Gallops,Rubs or new Murmurs, No Parasternal Heave +ve B.Sounds, Abd Soft, Non tender, No organomegaly appriciated, No rebound -guarding or rigidity. No Cyanosis, Clubbing, trace edema, No new Rash or bruise   Data Review   CBC w Diff:  Lab Results  Component Value Date   WBC 10.0 07/20/2019   HGB 12.6 07/20/2019   HCT 40.4 07/20/2019   PLT 284 07/20/2019   LYMPHOPCT 24 07/20/2019   MONOPCT 9 07/20/2019   EOSPCT 2 07/20/2019   BASOPCT 0 07/20/2019    CMP:  Lab Results  Component Value Date   NA 139 07/22/2019   K 3.4 (L) 07/22/2019   CL 101 07/22/2019   CO2 29 07/22/2019   BUN 17 07/22/2019   CREATININE 0.86 07/22/2019   PROT 6.4 (L) 07/20/2019   ALBUMIN 3.4 (L) 07/20/2019   BILITOT 1.2 07/20/2019   ALKPHOS 79 07/20/2019   AST 33 07/20/2019   ALT 28 07/20/2019  .   Total Time in preparing paper work, data evaluation and todays exam - 94 minutes  Lala Lund M.D on 07/22/2019 at 8:54 AM  [facility]   Office  [phone]

## 2019-07-22 NOTE — Progress Notes (Signed)
PT Cancellation Note  Patient Details Name: Kristen Shaffer MRN: [id number] DOB: 09/15/1956   Cancelled Treatment:    Reason Eval/Treat Not Completed: Other (comment)  Patient upset on arrival re: getting ready to discharge by 10:00 am. Per pt her MD told her she was ready to go and she called her daughter to arrange transportation. She had a long list of other complaints and did not want to practice up/down step with PT. LIstened and attempted to calm pt/answer her questions. RN made aware.    Barry Brunner, [place] [phone]     Rexanne Mano 07/22/2019, 9:33 AM

## 2019-07-22 NOTE — Plan of Care (Signed)

## 2019-07-22 NOTE — Discharge Instructions (Signed)
Follow with Primary MD Nolene Ebbs, MD in 7 days   Get CBC, CMP, 2 view Chest X ray -  checked next visit within 1 week by Primary MD   Activity: As tolerated with Full fall precautions use walker/cane & assistance as needed  Disposition Home   Diet: Heart Healthy. Check your Weight same time everyday, if you gain over 2 pounds, or you develop in leg swelling, experience more shortness of breath or chest pain, call your Primary MD immediately. Follow Cardiac Low Salt Diet and 1.5 lit/day fluid restriction.  Special Instructions: If you have smoked or chewed Tobacco  in the last 2 yrs please stop smoking, stop any regular Alcohol  and or any Recreational drug use.  On your next visit with your primary care physician please Get Medicines reviewed and adjusted.  Please request your Prim.MD to go over all Hospital Tests and Procedure/Radiological results at the follow up, please get all Hospital records sent to your Prim MD by signing hospital release before you go home.  If you experience worsening of your admission symptoms, develop shortness of breath, life threatening emergency, suicidal or homicidal thoughts you must seek medical attention immediately by calling 911 or calling your MD immediately  if symptoms less severe.  You Must read complete instructions/literature along with all the possible adverse reactions/side effects for all the Medicines you take and that have been prescribed to you. Take any new Medicines after you have completely understood and accpet all the possible adverse reactions/side effects.         Person Under Monitoring Name: Kristen Shaffer  Location: [address] [place] [address]   Infection Prevention Recommendations for Individuals Confirmed to have, or Being Evaluated for, 2019 Novel Coronavirus (COVID-19) Infection Who Receive Care at Home  Individuals who are confirmed to have, or are being evaluated for, COVID-19 should follow the  prevention steps below until a healthcare provider or local or state health department says they can return to normal activities.  Stay home except to get medical care You should restrict activities outside your home, except for getting medical care. Do not go to work, school, or public areas, and do not use public transportation or taxis.  Call ahead before visiting your doctor Before your medical appointment, call the healthcare provider and tell them that you have, or are being evaluated for, COVID-19 infection. This will help the healthcare providers office take steps to keep other people from getting infected. Ask your healthcare provider to call the local or state health department.  Monitor your symptoms Seek prompt medical attention if your illness is worsening (e.g., difficulty breathing). Before going to your medical appointment, call the healthcare provider and tell them that you have, or are being evaluated for, COVID-19 infection. Ask your healthcare provider to call the local or state health department.  Wear a facemask You should wear a facemask that covers your nose and mouth when you are in the same room with other people and when you visit a healthcare provider. People who live with or visit you should also wear a facemask while they are in the same room with you.  Separate yourself from other people in your home As much as possible, you should stay in a different room from other people in your home. Also, you should use a separate bathroom, if available.  Avoid sharing household items You should not share dishes, drinking glasses, cups, eating utensils, towels, bedding, or other items with other  people in your home. After using these items, you should wash them thoroughly with soap and water.  Cover your coughs and sneezes Cover your mouth and nose with a tissue when you cough or sneeze, or you can cough or sneeze into your sleeve. Throw used tissues in a lined  trash can, and immediately wash your hands with soap and water for at least 20 seconds or use an alcohol-based hand rub.  Wash your [facility] your hands often and thoroughly with soap and water for at least 20 seconds. You can use an alcohol-based hand sanitizer if soap and water are not available and if your hands are not visibly dirty. Avoid touching your eyes, nose, and mouth with unwashed hands.   Prevention Steps for Caregivers and Household Members of Individuals Confirmed to have, or Being Evaluated for, COVID-19 Infection Being Cared for in the Home  If you live with, or provide care at home for, a person confirmed to have, or being evaluated for, COVID-19 infection please follow these guidelines to prevent infection:  Follow healthcare providers instructions Make sure that you understand and can help the patient follow any healthcare provider instructions for all care.  Provide for the patients basic needs You should help the patient with basic needs in the home and provide support for getting groceries, prescriptions, and other personal needs.  Monitor the patients symptoms If they are getting sicker, call his or her medical provider and tell them that the patient has, or is being evaluated for, COVID-19 infection. This will help the healthcare providers office take steps to keep other people from getting infected. Ask the healthcare provider to call the local or state health department.  Limit the number of people who have contact with the patient  If possible, have only one caregiver for the patient.  Other household members should stay in another home or place of residence. If this is not possible, they should stay  in another room, or be separated from the patient as much as possible. Use a separate bathroom, if available.  Restrict visitors who do not have an essential need to be in the home.  Keep older adults, very young children, and other sick people away  from the patient Keep older adults, very young children, and those who have compromised immune systems or chronic health conditions away from the patient. This includes people with chronic heart, lung, or kidney conditions, diabetes, and cancer.  Ensure good ventilation Make sure that shared spaces in the home have good air flow, such as from an air conditioner or an opened window, weather permitting.  Wash your hands often  Wash your hands often and thoroughly with soap and water for at least 20 seconds. You can use an alcohol based hand sanitizer if soap and water are not available and if your hands are not visibly dirty.  Avoid touching your eyes, nose, and mouth with unwashed hands.  Use disposable paper towels to dry your hands. If not available, use dedicated cloth towels and replace them when they become wet.  Wear a facemask and gloves  Wear a disposable facemask at all times in the room and gloves when you touch or have contact with the patients blood, body fluids, and/or secretions or excretions, such as sweat, saliva, sputum, nasal mucus, vomit, urine, or feces.  Ensure the mask fits over your nose and mouth tightly, and do not touch it during use.  Throw out disposable facemasks and gloves after using them. Do not  reuse.  Wash your hands immediately after removing your facemask and gloves.  If your personal clothing becomes contaminated, carefully remove clothing and launder. Wash your hands after handling contaminated clothing.  Place all used disposable facemasks, gloves, and other waste in a lined container before disposing them with other household waste.  Remove gloves and wash your hands immediately after handling these items.  Do not share dishes, glasses, or other household items with the patient  Avoid sharing household items. You should not share dishes, drinking glasses, cups, eating utensils, towels, bedding, or other items with a patient who is confirmed to  have, or being evaluated for, COVID-19 infection.  After the person uses these items, you should wash them thoroughly with soap and water.  Wash laundry thoroughly  Immediately remove and wash clothes or bedding that have blood, body fluids, and/or secretions or excretions, such as sweat, saliva, sputum, nasal mucus, vomit, urine, or feces, on them.  Wear gloves when handling laundry from the patient.  Read and follow directions on labels of laundry or clothing items and detergent. In general, wash and dry with the warmest temperatures recommended on the label.  Clean all areas the individual has used often  Clean all touchable surfaces, such as counters, tabletops, doorknobs, bathroom fixtures, toilets, phones, keyboards, tablets, and bedside tables, every day. Also, clean any surfaces that may have blood, body fluids, and/or secretions or excretions on them.  Wear gloves when cleaning surfaces the patient has come in contact with.  Use a diluted bleach solution (e.g., dilute bleach with 1 part bleach and 10 parts water) or a household disinfectant with a label that says EPA-registered for coronaviruses. To make a bleach solution at home, add 1 tablespoon of bleach to 1 quart (4 cups) of water. For a larger supply, add  cup of bleach to 1 gallon (16 cups) of water.  Read labels of cleaning products and follow recommendations provided on product labels. Labels contain instructions for safe and effective use of the cleaning product including precautions you should take when applying the product, such as wearing gloves or eye protection and making sure you have good ventilation during use of the product.  Remove gloves and wash hands immediately after cleaning.  Monitor yourself for signs and symptoms of illness Caregivers and household members are considered close contacts, should monitor their health, and will be asked to limit movement outside of the home to the extent possible. Follow  the monitoring steps for close contacts listed on the symptom monitoring form.   ? If you have additional questions, contact your local health department or call the epidemiologist on call at [phone] (available 24/7). ? This guidance is subject to change. For the most up-to-date guidance from [facility], please refer to their website: [email or web address]

## 2019-07-25 ENCOUNTER — Ambulatory Visit
Admission: EM | Admit: 2019-07-25 | Discharge: 2019-07-25 | Disposition: A | Discharge status: Home / Self Care | Payer: Medicare HMO

## 2019-07-25 DIAGNOSIS — I872 Venous insufficiency (chronic) (peripheral): Secondary | ICD-10-CM | POA: Diagnosis not present

## 2019-07-25 DIAGNOSIS — U071 COVID-19: Secondary | ICD-10-CM | POA: Diagnosis not present

## 2019-07-25 DIAGNOSIS — I10 Essential (primary) hypertension: Secondary | ICD-10-CM

## 2019-07-25 DIAGNOSIS — I890 Lymphedema, not elsewhere classified: Secondary | ICD-10-CM

## 2019-07-25 DIAGNOSIS — Z658 Other specified problems related to psychosocial circumstances: Secondary | ICD-10-CM

## 2019-07-25 NOTE — Discharge Instructions (Signed)
Continue all of your hospital discharge medications.   Go to ER for further evaluation if you develop chest pain, difficulty breathing, foot numbness, open wound.

## 2019-07-25 NOTE — ED Provider Notes (Addendum)
[facility]    CSN: [id number] Arrival date & time: 07/25/19  1925      History   Chief Complaint Chief Complaint  Patient presents with  . Leg Pain    HPI Kristen Kristen Shaffer is a 63 y.o. female with history of Kristen Shaffer, Kristen Kristen Shaffer, Kristen Kristen Shaffer, Kristen Kristen Shaffer, Kristen Kristen Shaffer, Kristen Kristen Shaffer if this will help with her bilateral lower extremity pain.  Patient was in the hospital 8/30 through 9/4, records reviewed by me during time of appointment.  Incidental finding of COVID-19 positive during hospital course for acute on Kristen diastolic CHF, bilateral lower extremity Kristen Shaffer.  Negative for DVT, short term course of doxycycline for possible mild cellulitis.  Discharged with medications as below.  Patient reports compliance with her medications, denies adverse effect.  States gabapentin, furosemide has not been helpful with her leg pain, though patient has noticed her legs are not as swollen as they were prior to admission.  Patient was supposed to follow-up with PCP within 1 week, though was told that they do not have any availability until October 1st.  Of note, patient appears to have significant socioeconomic stressors in her life.  Denies SI/HI.    Past Medical History:  Diagnosis Date  . Anemia    history  . Arthritis    Kristen Shaffer  . Kristen Kristen Shaffer    Childhood  . Bilateral leg Kristen Shaffer   . Colon polyps   . 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 Depression   . Fibroid   . GERD (gastroesophageal reflux disease)   . Herniated disc   . Kristen Kristen Shaffer    no medications at this time  . Lichen sclerosus   . PONV (postoperative nausea and vomiting)   . Rheumatic fever    in childhood  . Sciatic leg pain   . Vocal cord nodule     Patient Active Problem List   Diagnosis Date Noted  . Leg swelling   . Kristen Kristen Shaffer ([place]) 06/11/2019   . Weakness   . Acute respiratory failure with hypoxia ([place]) 06/09/2019  . COVID-19 virus infection 06/08/2019  . GERD (gastroesophageal reflux disease)   . UTI (urinary tract infection)   . AKI (acute kidney injury) ([place])   . Arthritis   . Kristen Kristen Shaffer   . Depression   . Fibroid     Past Surgical History:  Procedure Laterality Date  . COLONOSCOPY    . DILATATION & CURETTAGE/HYSTEROSCOPY WITH MYOSURE N/A 10/23/2015   Procedure: DILATATION & CURETTAGE/HYSTEROSCOPY WITH MYOSURE;  Surgeon: Anastasio Auerbach, MD;  Location: [facility];  Service: Gynecology;  Laterality: N/A;  . DILATION AND CURETTAGE OF UTERUS    . HYSTEROSCOPY    . MYOMECTOMY  1999  . TONSILLECTOMY      OB History    Gravida  2   Para      Term      Preterm      AB  2   Living  0     SAB      TAB      Ectopic      Multiple      Live Births               Home Medications    Prior to Admission medications   Medication Sig Start Date End Date Taking? Authorizing Provider  acetaminophen (TYLENOL) 650 MG CR tablet Take 650 mg by  mouth every 8 (eight) hours as needed for pain.    [provider]  amLODipine (NORVASC) 10 MG tablet Take 1 tablet (10 mg total) by mouth daily. 07/22/19   Thurnell Lose, MD  carvedilol (COREG) 6.25 MG tablet Take 1 tablet (6.25 mg total) by mouth 2 (two) times daily with a meal. 07/22/19   Thurnell Lose, MD  Celecoxib (CELEBREX PO) Take 1 tablet by mouth daily as needed (pain).    [provider]  doxycycline (VIBRA-TABS) 100 MG tablet Take 1 tablet (100 mg total) by mouth every 12 (twelve) hours. 07/22/19   Thurnell Lose, MD  furosemide (LASIX) 40 MG tablet Take 1 tablet (40 mg total) by mouth daily. 07/22/19 07/21/20  Thurnell Lose, MD  gabapentin (NEURONTIN) 300 MG capsule Take 1 capsule (300 mg total) by mouth 3 (three) times daily. 07/22/19   Thurnell Lose, MD  polyethylene glycol (MIRALAX / GLYCOLAX) 17 g packet Take 17 g by mouth daily as  needed. 07/22/19   Thurnell Lose, MD  spironolactone (ALDACTONE) 25 MG tablet Take 1 tablet (25 mg total) by mouth daily. 07/22/19 08/21/19  Thurnell Lose, MD  zinc gluconate 50 MG tablet Take 50 mg by mouth daily.    [provider]    Family History Family History  Problem Relation Age of Onset  . Diabetes Mother   . Kristen Kristen Shaffer Mother   . Diabetes Father   . Heart disease Sister   . Kristen Kristen Shaffer Maternal Grandmother   . Diabetes Maternal Grandmother   . Cancer Sister        Colon  . Cancer Brother        Stomach    Social History Social History   Tobacco Use  . Smoking status: Never Smoker  . Smokeless tobacco: Never Used  Substance Use Topics  . Alcohol use: Yes    Alcohol/week: 0.0 standard drinks    Comment: occ  . Drug use: No     Allergies   Patient has no known allergies.   Review of Systems Review of Systems  Constitutional: Negative for activity change, appetite change, fatigue and fever.  HENT: Negative for ear pain, sinus pain, sore throat and voice change.   Eyes: Negative for photophobia, pain, redness and visual disturbance.  Respiratory: Negative for cough, chest tightness, shortness of breath, wheezing and stridor.   Cardiovascular: Negative for chest pain and palpitations.  Gastrointestinal: Negative for abdominal pain, diarrhea, nausea and vomiting.  Endocrine: Negative for polydipsia, polyphagia and polyuria.  Genitourinary: Negative for dysuria, frequency, hematuria and urgency.  Musculoskeletal: Positive for back pain and gait problem. Negative for arthralgias, myalgias and neck pain.       Patient uses walker for Kristen low back pain that has remained unchanged  Skin: Negative for rash and wound.  Neurological: Negative for dizziness, tremors, syncope, facial asymmetry, speech difficulty, weakness, light-headedness, numbness and headaches.     Physical Exam Triage Vital Signs ED Triage Vitals  Enc Vitals Group     BP  07/25/19 1936 (!) 144/85     Pulse Rate 07/25/19 1936 (!) 101     Resp 07/25/19 1936 20     Temp 07/25/19 1936 98.8 F (37.1 C)     Temp Source 07/25/19 1936 Oral     SpO2 07/25/19 1936 96 %     Weight --      Height --      Head Circumference --  Peak Flow --      Pain Score 07/25/19 1937 6     Pain Loc --      Pain Edu? --      Excl. in [place]? --    No data found.  Updated Vital Signs BP (!) 144/85 (BP Location: Left Arm)   Pulse (!) 101   Temp 98.8 F (37.1 C) (Oral)   Resp 20   SpO2 96%   Visual Acuity Right Eye Distance:   Left Eye Distance:   Bilateral Distance:    Right Eye Near:   Left Eye Near:    Bilateral Near:     Physical Exam Vitals signs reviewed.  Constitutional:      General: She is not in acute distress.    Appearance: She is obese. She is not ill-appearing.  HENT:     Head: Normocephalic and atraumatic.     Mouth/Throat:     Mouth: Mucous membranes are moist.     Pharynx: Oropharynx is clear.  Eyes:     General: No scleral icterus.       Right eye: No discharge.        Left eye: No discharge.     Extraocular Movements: Extraocular movements intact.     Conjunctiva/sclera: Conjunctivae normal.     Pupils: Pupils are equal, round, and reactive to light.  Neck:     Musculoskeletal: Normal range of motion and neck supple. No muscular tenderness.  Cardiovascular:     Rate and Rhythm: Normal rate and regular rhythm.     Pulses: Normal pulses.     Heart sounds: No murmur. No gallop.      Comments: Manual heart rate per provider: 94 bpm. Pulmonary:     Effort: Pulmonary effort is normal. No respiratory distress.     Breath sounds: No wheezing, rhonchi or rales.  Chest:     Chest wall: No tenderness.  Abdominal:     General: Abdomen is flat.     Palpations: Abdomen is soft.     Tenderness: There is no abdominal tenderness. There is no guarding.  Musculoskeletal:     Comments: Exam Kristen second to patient's habitus.  2+ pitting Kristen Shaffer to  proximal shin without knee involvement.  Mild tenderness to palpation.  Toes appear dry, without open wounds, ulcers, or superficial skin breakdown.  Mild erythema that turns to rubor with dependency.  DP pulses intact bilaterally.  Sensation intact.  Patient is able to ambulate with her rolling walker.  Lymphadenopathy:     Cervical: No cervical adenopathy.  Skin:    General: Skin is warm.     Capillary Refill: Capillary refill takes less than 2 seconds.     Coloration: Skin is not jaundiced or pale.     Findings: No rash.  Neurological:     General: No focal deficit present.     Mental Status: She is alert and oriented to person, place, and time.  Psychiatric:        Mood and Affect: Mood normal.        Thought Content: Thought content normal.      UC Treatments / Results  Labs (all labs ordered are listed, but only abnormal results are displayed) Labs Reviewed - No data to display  EKG   Radiology No results found.  Procedures Procedures (including critical care time)  Medications Ordered in UC Medications - No data to display  Initial Impression / Assessment and Plan / UC Course  I have reviewed  the triage vital signs and the nursing notes.  Pertinent labs & imaging results that were available during my care of the patient were reviewed by me and considered in my medical decision making (see chart for details).     1.  Lymphedema Patient to follow-up with primary care, possibly vascular.  We will continue with TED hose, compression, Lasix.  Patient unable to apply her own TED hose, discussed that home health may be useful and that this should be discussed at her PCP appointment.  2.  Venous stasis dermatitis of bilateral lower extremities Mild case.  Would improve with compression compliance.  Discussed that antibiotic use at this time would be inappropriate.  Low concern for DVT given symmetry bilaterally, though given setting of COVID discussed strict return  precautions thereof.  3.  Psychosocial stressors Denies SI/HI.  Not currently on pharmacological therapy.  Would benefit greatly from help at home, therapy.  Patient to discuss this with PCP.  Addendum: This provider contacted patient's PCP office regarding hospital discharge follow-up schedule.  Soonest available for Friday 9/18 at 11:15 AM.  Confirmed with scheduler.  Requested that they contact patient to inform them: States she will do so.  This provider then left message on patient's cell phone number regarding appointment time and call back number for her PCP. Final Clinical Impressions(s) / UC Diagnoses   Final diagnoses:  Lymphedema  Venous stasis dermatitis of both lower extremities  Psychosocial stressors     Discharge Instructions     Continue all of your hospital discharge medications.   Go to ER for further evaluation if you develop chest pain, difficulty breathing, foot numbness, open wound.    ED Prescriptions    None     Controlled Substance Prescriptions [place] Controlled Substance Registry consulted? Not Applicable   Quincy Sheehan, PA-C 07/25/19 2027    [place], [place], [place] 07/26/19 1222

## 2019-07-25 NOTE — ED Triage Notes (Signed)
Pt c/o bilateral leg and foot pain for over a week. States admitted for 5 days, dx'd with COVID. States still having pain.

## 2019-08-12 ENCOUNTER — Ambulatory Visit
Admission: EM | Admit: 2019-08-12 | Discharge: 2019-08-12 | Disposition: A | Discharge status: Home / Self Care | Payer: Medicare HMO

## 2019-08-12 ENCOUNTER — Other Ambulatory Visit: Payer: Self-pay

## 2019-08-12 NOTE — ED Notes (Signed)
Patient wanting to be retested for COVID.   This RN spoke with patient about symptoms, denies any at this time, denies need to speak to a provider.  This RN informed patient of recommendation to wait until 6 weeks after her last positive before retesting.  Confirmed with patient she did not have any physical needs to speak to a provider about today, and was only here for retesting.  Will d/c.

## 2019-08-16 ENCOUNTER — Encounter: Payer: Self-pay | Admitting: Gynecology

## 2019-08-16 ENCOUNTER — Ambulatory Visit: Payer: Medicare HMO | Admitting: Cardiovascular Disease

## 2019-08-16 ENCOUNTER — Telehealth: Payer: Self-pay

## 2019-08-16 NOTE — Telephone Encounter (Signed)
    COVID-19 Pre-Screening Questions:  . In the past 7 to 10 days have you had a cough,  shortness of breath, headache, congestion, fever (100 or greater) body aches, chills, sore throat, or sudden loss of taste or sense of smell? DENIES SYMPTOMS; NO FEVER BUT STATES THAT SHE TAKES TYLENOL  . Have you been around anyone with known Covid 19? NO. PT TESTED POSITIVE ON 06/08/2019 AND WAS ADMITTED AND TREATED FOR COVID-19 RELATED PNEUMONIA. PT  WENT TO URGENT CARE 08/12/2019 TO BE RE-TESTED AND WAS ADVISED TO RETURN IN 2 WEEKS (08/12/2019 ED/URGENT CARE NOTE STATES AN "RN informed patient of recommendation to wait until 6 weeks after her last positive before retesting.")  Pt last positive COVID-19 test was on 07/18/2019   Have you been around anyone who is awaiting Covid 19 test results in the past 7 to 10 days? NO  . Have you been around anyone who has been exposed to Covid 19, or has mentioned symptoms of Covid 19 within the past 7 to 10 days? NO  If you have any concerns/questions about symptoms patients report during screening (either on the phone or at threshold). Contact the provider seeing the patient or DOD for further guidance.  If neither are available contact a member of the leadership team.  08/16/2019  0840  Spoke with pt who states that she was unaware that she had an appt today with Dr. Gwenlyn Found. Reviewed COVID-19 questionnaire with pt. Advised pt to reschedule for virtual and that message would be routed to scheduling. Pt agreeable. Pt states that she may be at her friend's home when scheduling contacts her and her cell phone is not working. She requests that if she cannot be reached at home, scheduling contact her at [phone]. She states this is her friend Kennyth Lose Bennet's phone number. Pt has option to be set up for OV if her next COVID-19 test is negative.

## 2019-08-22 ENCOUNTER — Telehealth: Payer: Self-pay

## 2019-08-22 NOTE — Telephone Encounter (Signed)
lmtcb to change 10/7 appt to OV or reschedule for a virtual day with Dr. Gwenlyn Found

## 2019-08-24 ENCOUNTER — Ambulatory Visit: Payer: Medicare HMO | Admitting: Cardiovascular Disease

## 2019-08-24 ENCOUNTER — Encounter: Payer: Self-pay | Admitting: Cardiovascular Disease

## 2019-08-24 ENCOUNTER — Other Ambulatory Visit: Payer: Self-pay

## 2019-08-24 DIAGNOSIS — I10 Essential (primary) hypertension: Secondary | ICD-10-CM | POA: Diagnosis not present

## 2019-08-24 DIAGNOSIS — I5189 Other ill-defined heart diseases: Secondary | ICD-10-CM

## 2019-08-24 MED ORDER — TORSEMIDE 20 MG PO TABS: 20.0000 mg | ORAL_TABLET | Freq: Two times a day (BID) | ORAL | 3 refills | Status: AC

## 2019-08-24 MED ORDER — CARVEDILOL 6.25 MG PO TABS: 6.2500 mg | ORAL_TABLET | Freq: Two times a day (BID) | ORAL | 3 refills | Status: DC

## 2019-08-24 MED ORDER — BLOOD PRESSURE CUFF MISC: 1.0000 | Freq: Every day | 0 refills | Status: DC

## 2019-08-24 MED ORDER — SPIRONOLACTONE 25 MG PO TABS: 25.0000 mg | ORAL_TABLET | Freq: Every day | ORAL | 3 refills | Status: DC

## 2019-08-24 NOTE — Assessment & Plan Note (Signed)
History of essential hypertension on multiple medications up until a week ago when she ran out and stopped taking them.  Her blood pressure today is 169/106.  I am going to restart her carvedilol, spironolactone and torsemide.  She does not eat salt.  We will check a basic metabolic panel in 10 days.  I am going to have her obtain and a digital blood pressure cuff and keep a blood pressure log at home for the next 30 days.  She will make an appointment for her to see Cyril Mourning in the office to review make medication changes.

## 2019-08-24 NOTE — Patient Instructions (Addendum)
Medication Instructions:  Your physician has recommended you make the following change in your medication:   RESTART YOUR CARVEDILOL (COREG) 6.25 MG BY MOUTH TWICE A DAY.  RESTART YOUR SPIRONOLACTONE (ALDACTONE) 25 MG BY MOUTH DAILY.  STOP TAKING YOUR FUROSEMIDE (LASIX).  START TAKING TORSEMIDE (DEMADEX) 20 MG BY MOUTH TWICE A DAY.   If you need a refill on your cardiac medications before your next appointment, please call your pharmacy.   Lab work: Your physician recommends that you return for lab work IN 10 DAYS: BASIC METABOLIC PANEL  If you have labs (blood work) drawn today and your tests are completely normal, you will receive your results only by: [name] MyChart Message (if you have MyChart) OR . A paper copy in the mail If you have any lab test that is abnormal or we need to change your treatment, we will call you to review the results.  Testing/Procedures: NONE  Follow-Up: At [facility], you and your health needs are our priority.  As part of our continuing mission to provide you with exceptional heart care, we have created designated Provider Care Teams.  These Care Teams include your primary Cardiologist (physician) and Advanced Practice Providers (APPs -  Physician Assistants and Nurse Practitioners) who all work together to provide you with the care you need, when you need it. . You will need a follow up appointment in 3 months with an APP and in 6 months with Dr. Quay Burow.  Please call our office 2 months in advance to schedule each appointment.  You may see one of the following Advanced Practice Providers on your designated Care Team:   . Kerin Ransom, PA-C . Daleen Snook Kroeger, PA-C . Sande Rives, PA-C ____________________ . Almyra Deforest, PA-C . Fabian Sharp, PA-C . Jory Sims, DNP . Rosaria Ferries, PA-C   Any Other Special Instructions Will Be Listed Below (If Applicable). KEEP A BLOOD PRESSURE LOG FOR 30 DAYS THEN FOLLOW UP WITH A CLINICAL PHARMACIST IN  THE HYPERTENSION CLINIC. YOU WILL NEED AN IN-PERSON APPOINTMENT IN THE OFFICE. PLEASE CALL TO SCHEDULE THIS IN-PERSON APPOINTMENT IF YOU DID NOT ALREADY DO SO DURING YOUR LAST VISIT AT [facility].

## 2019-08-24 NOTE — Assessment & Plan Note (Signed)
Recent 2D echo performed 07/18/2019 revealed normal LV function, mild concentric LVH and diastolic dysfunction.  She does have lower extremity edema.  She has been out of her diuretics for a week.  We will start her back on torsemide 20 mg a day and Spironolactone 25 mg a day

## 2019-08-24 NOTE — Progress Notes (Signed)
08/24/2019 Kristen Shaffer   06-16-1956  [id number]  Primary Physician Nolene Ebbs, MD Primary Cardiologist: [name] Harp MD Kristen Shaffer, [place]  HPI:  Kristen Shaffer is a 63 y.o. morbidly overweight single African-American female with no biologic children who has been disabled since age 53.  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  She has chronic pain because of her back as well as long bouts of depression.  She does have a history of hypertension.  She is never had a heart tach or stroke.  She has had GERD as well.  She was hospitalized in August because of UTI and lower extreme edema.  She is found to be COVID positive.  She was treated with diuretics.  2D echo revealed normal LV systolic function with diastolic dysfunction.  She apparently ran out of her medications a week ago and these have not been refilled.  She is hypertensive today.   Current Meds  Medication Sig  . acetaminophen (TYLENOL) 650 MG CR tablet Take 650 mg by mouth every 8 (eight) hours as needed for pain.  . carvedilol (COREG) 6.25 MG tablet Take 1 tablet (6.25 mg total) by mouth 2 (two) times daily with a meal.  . polyethylene glycol (MIRALAX / GLYCOLAX) 17 g packet Take 17 g by mouth daily as needed.  . Turmeric (QC TUMERIC COMPLEX) 500 MG CAPS Take by mouth.  . zinc gluconate 50 MG tablet Take 50 mg by mouth daily.     No Known Allergies  Social History   Socioeconomic History  . Marital status: Single    Spouse name: Not on file  . Number of children: Not on file  . Years of education: Not on file  . Highest education level: Not on file  Occupational History  . Not on file  Social Needs  . Financial resource strain: Not on file  . Food insecurity    Worry: Not on file    Inability: Not on file  . Transportation needs    Medical: Not on file    Non-medical: Not on file  Tobacco Use  . Smoking status: Never Smoker  . Smokeless tobacco: Never Used  Substance and Sexual Activity  .  Alcohol use: Yes    Alcohol/week: 0.0 standard drinks    Comment: occ  . Drug use: No  . Sexual activity: Not Currently    Birth control/protection: Post-menopausal    Comment: 1st intercourse 37 yo-5 partners  Lifestyle  . Physical activity    Days per week: Not on file    Minutes per session: Not on file  . Stress: Not on file  Relationships  . Social Herbalist on phone: Not on file    Gets together: Not on file    Attends religious service: Not on file    Active member of club or organization: Not on file    Attends meetings of clubs or organizations: Not on file    Relationship status: Not on file  . Intimate partner violence    Fear of current or ex partner: Not on file    Emotionally abused: Not on file    Physically abused: Not on file    Forced sexual activity: Not on file  Other Topics Concern  . Not on file  Social History Narrative  . Not on file     Review of Systems: General: negative for chills, fever, night sweats or weight changes.  Cardiovascular:  negative for chest pain, dyspnea on exertion, edema, orthopnea, palpitations, paroxysmal nocturnal dyspnea or shortness of breath Dermatological: negative for rash Respiratory: negative for cough or wheezing Urologic: negative for hematuria Abdominal: negative for nausea, vomiting, diarrhea, bright red blood per rectum, melena, or hematemesis Neurologic: negative for visual changes, syncope, or dizziness All other systems reviewed and are otherwise negative except as noted above.    Blood pressure (!) 169/106, pulse (!) 116, temperature 98.1 F (36.7 C), height 5\' 10"  (1.778 m), weight (!) 305 lb (138.3 kg), SpO2 98 %.  General appearance: alert and no distress Neck: no adenopathy, no carotid bruit, no JVD, supple, symmetrical, trachea midline and thyroid not enlarged, symmetric, no tenderness/mass/nodules Lungs: clear to auscultation bilaterally Heart: regular rate and rhythm, S1, S2 normal, no  murmur, click, rub or gallop Extremities: 1-2+ pitting edema bilaterally Pulses: 2+ and symmetric Skin: Skin color, texture, turgor normal. No rashes or lesions Neurologic: Alert and oriented X 3, normal strength and tone. Normal symmetric reflexes. Normal coordination and gait  EKG not performed today although EKG 07/17/2019 revealed sinus tachycardia with right bundle branch block.  I personally reviewed that EKG.  ASSESSMENT AND PLAN:   Hypertension History of essential hypertension on multiple medications up until a week ago when she ran out and stopped taking them.  Her blood pressure today is 169/106.  I am going to restart her carvedilol, spironolactone and torsemide.  She does not eat salt.  We will check a basic metabolic panel in 10 days.  I am going to have her obtain and a digital blood pressure cuff and keep a blood pressure log at home for the next 30 days.  She will make an appointment for her to see Cyril Mourning in the office to review make medication changes.  Diastolic dysfunction Recent 2D echo performed 07/18/2019 revealed normal LV function, mild concentric LVH and diastolic dysfunction.  She does have lower extremity edema.  She has been out of her diuretics for a week.  We will start her back on torsemide 20 mg a day and Spironolactone 25 mg a day      [name] Harp MD [facility], [facility] 08/24/2019 10:49 AM

## 2019-08-26 ENCOUNTER — Telehealth: Payer: Self-pay | Admitting: Cardiovascular Disease

## 2019-08-26 NOTE — Telephone Encounter (Signed)
Patient wanted to know what type of BP cuff Dr. Gwenlyn Found wanted her to get. She also wanted to know if Dr. Gwenlyn Found would write her a rx so that her insurance would cover it.

## 2019-08-29 MED ORDER — BLOOD PRESSURE CUFF MISC: 1.0000 | Freq: Every day | 0 refills | Status: AC

## 2019-08-29 NOTE — Addendum Note (Signed)
Addended by: Annita Brod on: 08/29/2019 08:37 AM   Modules accepted: Orders

## 2019-08-29 NOTE — Telephone Encounter (Signed)
Spoke with pt and advised that Dr. Gwenlyn Found wanted her to get an [employer] brand blood pressure cuff. Advised her that Rx for the BP cuff was printed and given to her during her appt. Pt states she does not believe she has the physical Rx. She requests Rx for BP cuff be sent to [facility] on [address]. Rx sent to requested pharmacy. She questioned if it is okay to come to office in more than 10 days from her last OV for lab work since she did not start taking all meds updated during her last OV as prescribed until Sunday. Advised pt to present to office for lab work 10 days from when she started taking meds per updated med list. Pt verbalized understanding

## 2019-08-30 NOTE — Telephone Encounter (Signed)
Follow Up   Patient states that her insurance will provide a blood pressure cuff, but it is not the omron brand and it will take 10 days to be shipped out. Patient would like to know if that will be ok to avoid having to pay out of pocket. Also patient states that she has misplaced the prescription and may need a refill. Please give patient a call back to discuss.

## 2019-09-13 NOTE — Telephone Encounter (Signed)
Spoke with pt who states that she has received a BP cuff from her insurance company but still needs education on how to use it. During her last OV 10/7, primary nurse demonstrated how to check BP with pt, including what each number meant. Primary nurse also provided pt with print-out of instructions on how to check BP via 'send letter'. Pt states she has instructions for BP from last OV. Pt states her daughter is a CMA and she will have her daughter help her check BP. Recommended that pt contact office when her daughter is present and they both can speak with primary nurse to discuss steps for checking BP. Pt states that she was unable to find Rx for her carvedilol but eventually did and this was after she had already paid for med out-of pocket. She states she has not come in for repeat lab work because she has been unable to take her second dose of BID meds in the evening d/t forgetfulness and tiredness that she believes is related to insomnia. Advised that pt follow up with her PCP regarding insomnia  after call with primary nurse. Pt states starting today she will be more mindful of taking her meds regularly and will come back for lab work and will contact office with any questions or concerns

## 2019-09-16 ENCOUNTER — Telehealth: Payer: Self-pay

## 2019-09-16 NOTE — Telephone Encounter (Signed)
Pharmacy updated as requested.

## 2019-09-27 ENCOUNTER — Ambulatory Visit: Payer: Medicare HMO

## 2019-10-04 DIAGNOSIS — R490 Dysphonia: Secondary | ICD-10-CM | POA: Insufficient documentation

## 2019-10-26 ENCOUNTER — Ambulatory Visit: Payer: Medicare HMO

## 2019-11-08 ENCOUNTER — Other Ambulatory Visit: Payer: Self-pay | Admitting: Cardiovascular Disease

## 2019-11-09 LAB — BASIC METABOLIC PANEL
BUN/Creatinine Ratio: 20 (ref 12–28)
BUN: 20 mg/dL (ref 8–27)
CO2: 21 mmol/L (ref 20–29)
Calcium: 10.1 mg/dL (ref 8.7–10.3)
Chloride: 102 mmol/L (ref 96–106)
Creatinine, Ser: 1 mg/dL (ref 0.57–1.00)
GFR calc Af Amer: 68 mL/min/{1.73_m2} (ref >59)
GFR calc non Af Amer: 59 mL/min/{1.73_m2} — ABNORMAL LOW (ref >59)
Glucose: 92 mg/dL (ref 65–99)
Potassium: 4.5 mmol/L (ref 3.5–5.2)
Sodium: 148 mmol/L — ABNORMAL HIGH (ref 134–144)

## 2019-11-16 IMAGING — DX LEFT ANKLE COMPLETE - 3+ VIEW
3 series · 3 of 3 positions shown · non-contrast
Comparison: None.

CLINICAL DATA: Bilateral lower extremity edema.

EXAM:
LEFT ANKLE COMPLETE - 3+ VIEW

[ankle ap]
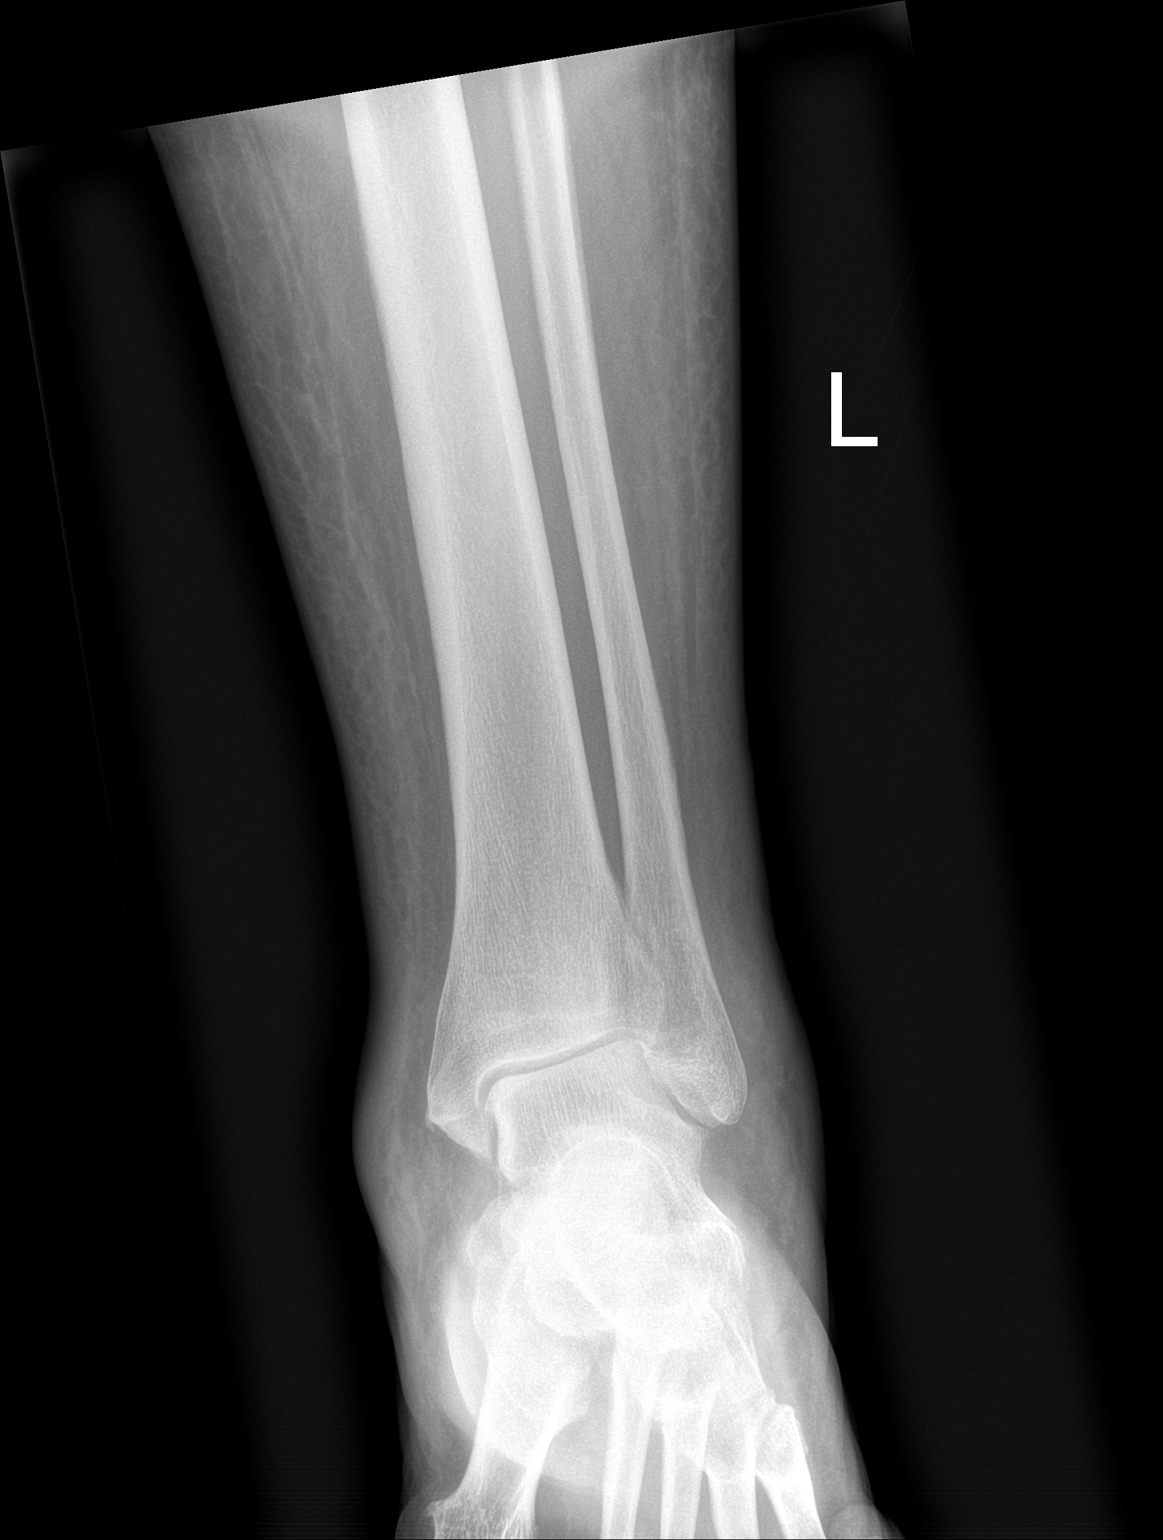

[ankle obl]
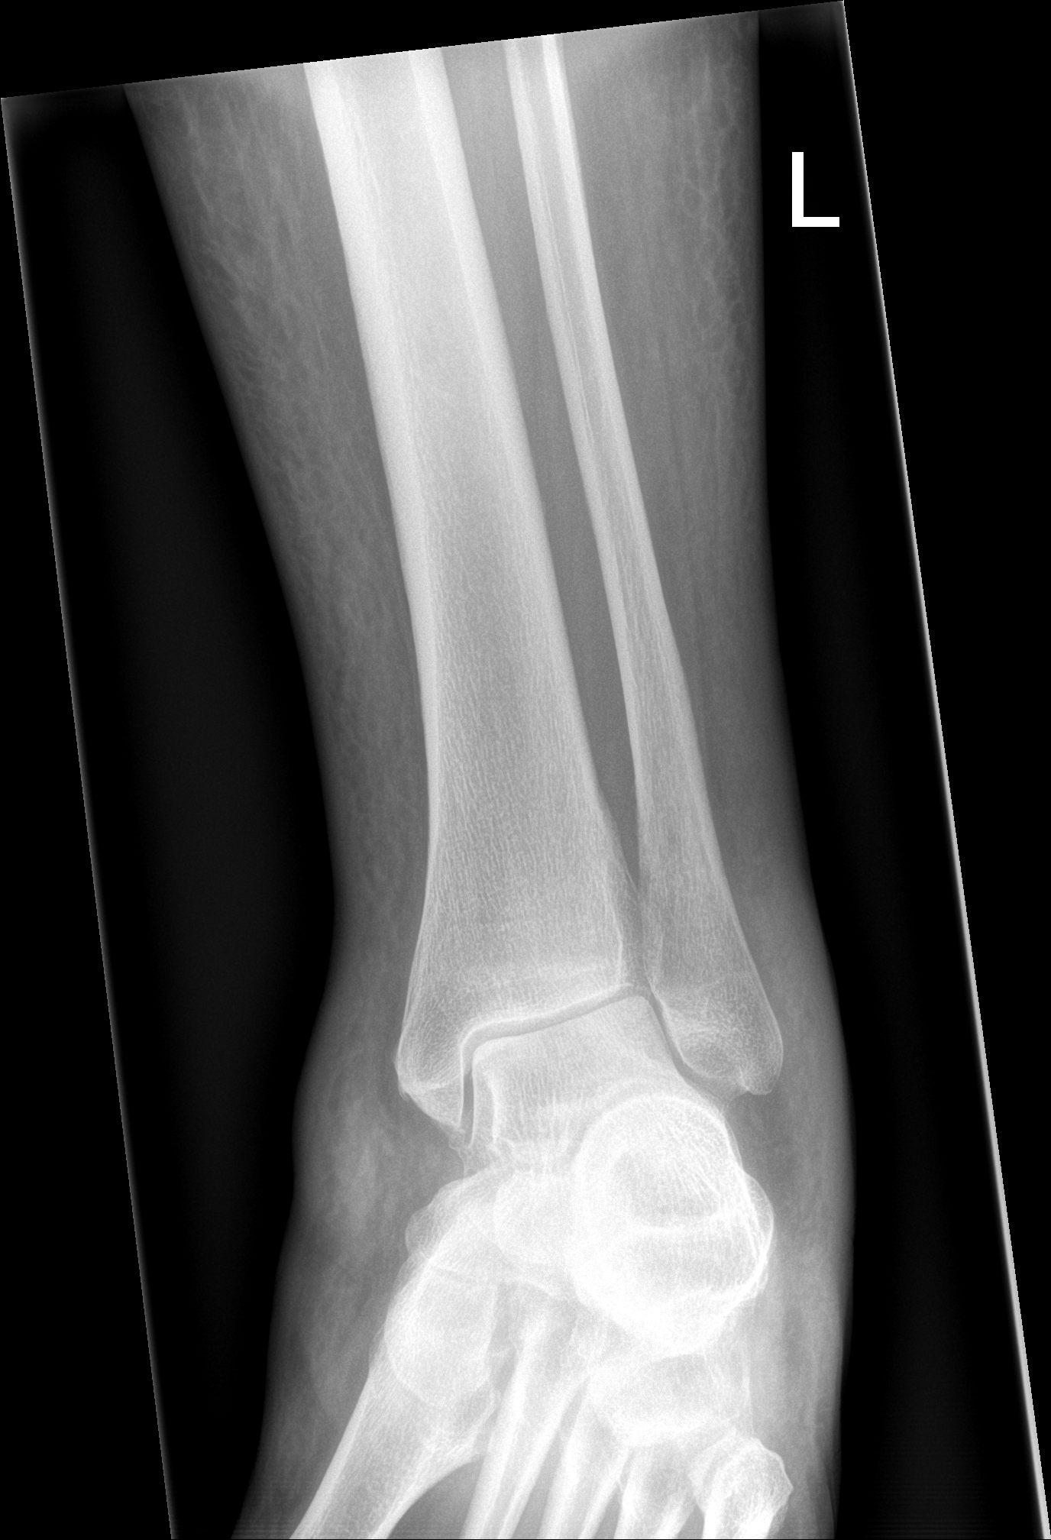

[ankle lat]
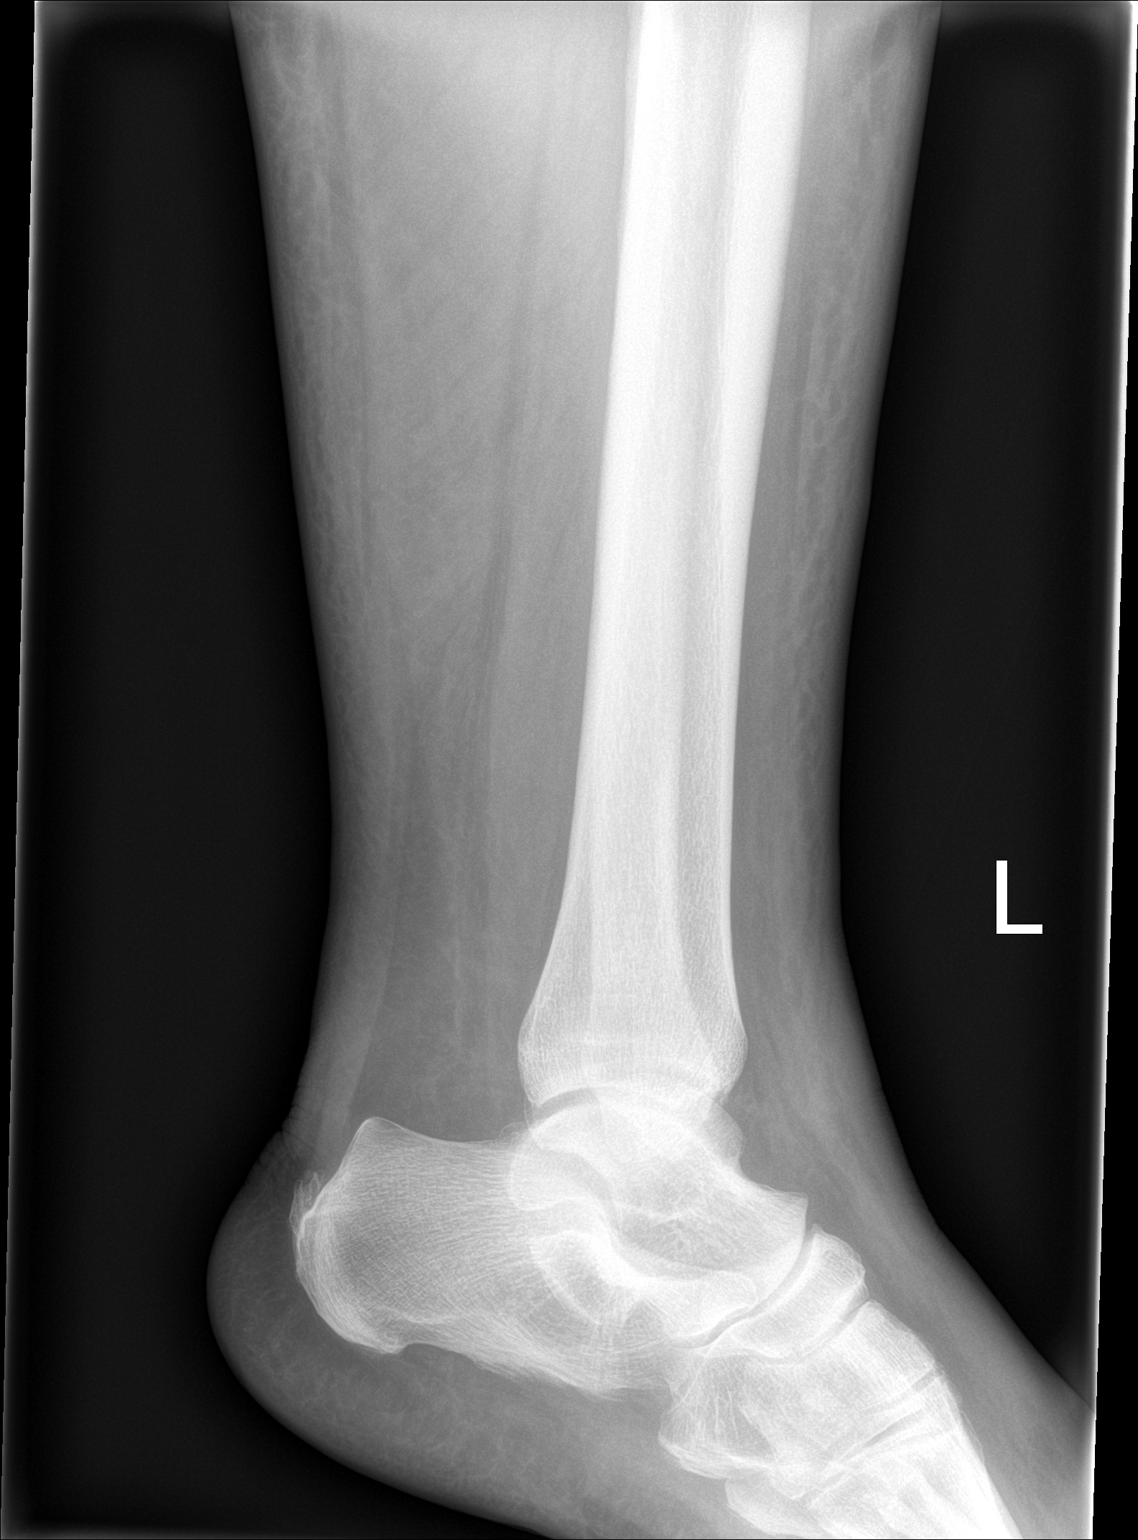

[3 of 3 positions shown; findings below may reference images not displayed]

FINDINGS: There is no evidence of fracture, dislocation, or joint effusion.
There is no evidence of arthropathy or other focal bone abnormality.
Achilles tendon enthesophyte. Generalized soft tissue edema.
IMPRESSION: Generalized soft tissue edema. No osseous abnormality.

## 2019-11-16 IMAGING — DX RIGHT ANKLE - 2 VIEW
2 series · 2 of 2 positions shown · non-contrast
Comparison: None.

CLINICAL DATA: Bilateral lower extremity edema.

EXAM:
RIGHT ANKLE - 2 VIEW

[ankle ap]
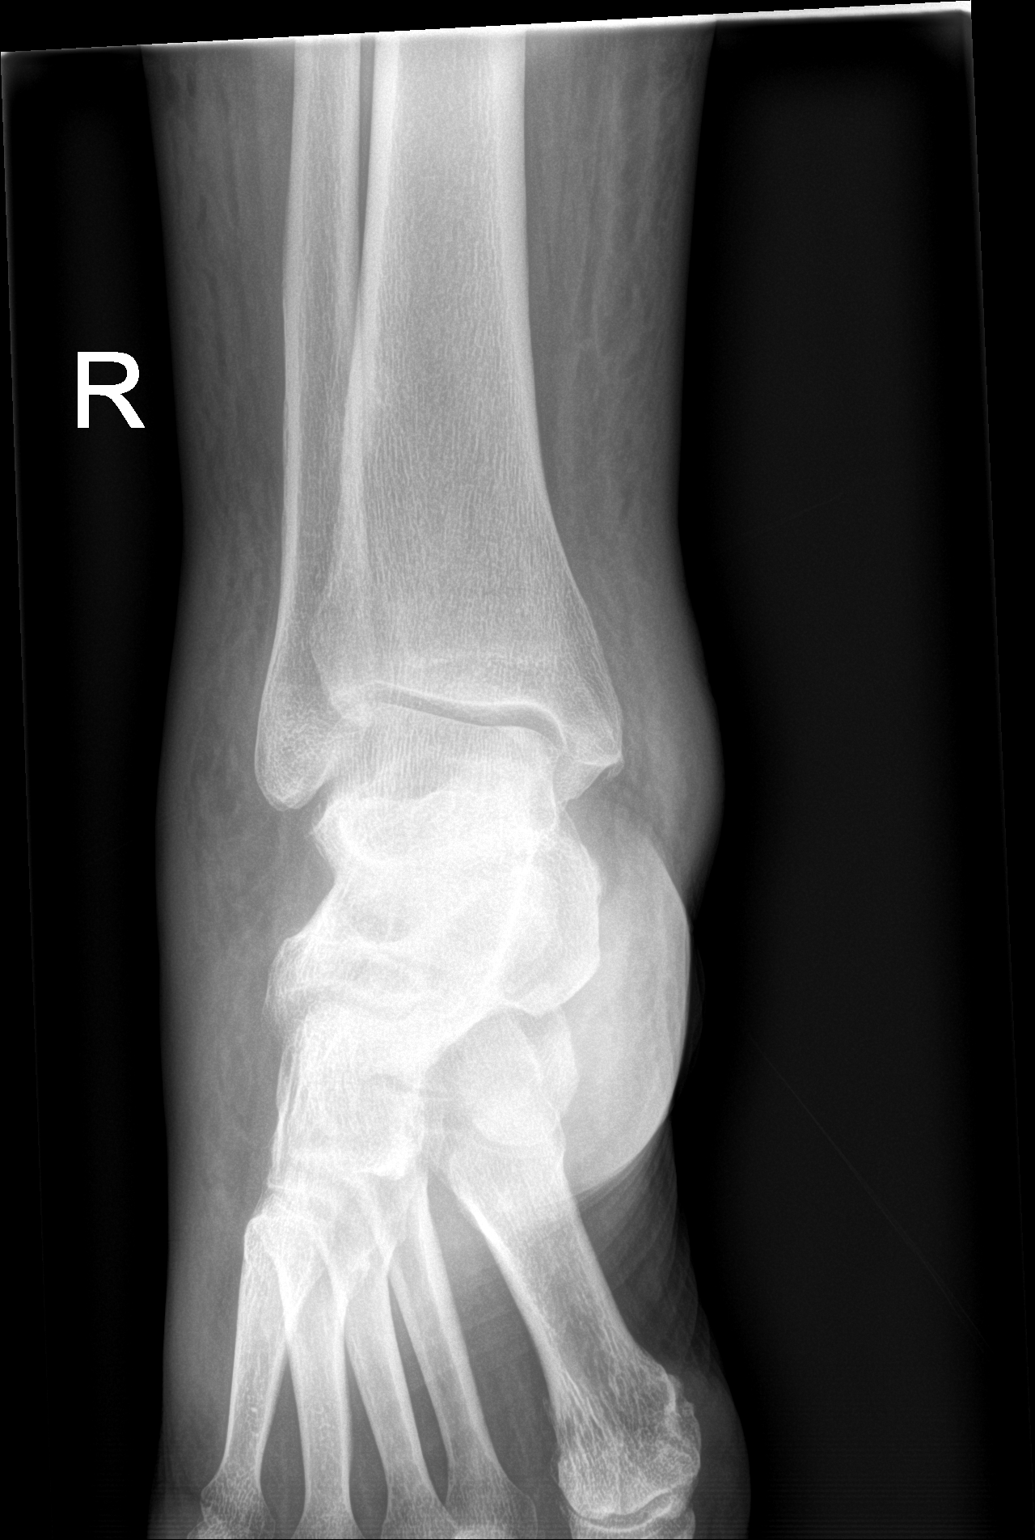

[ankle lat]
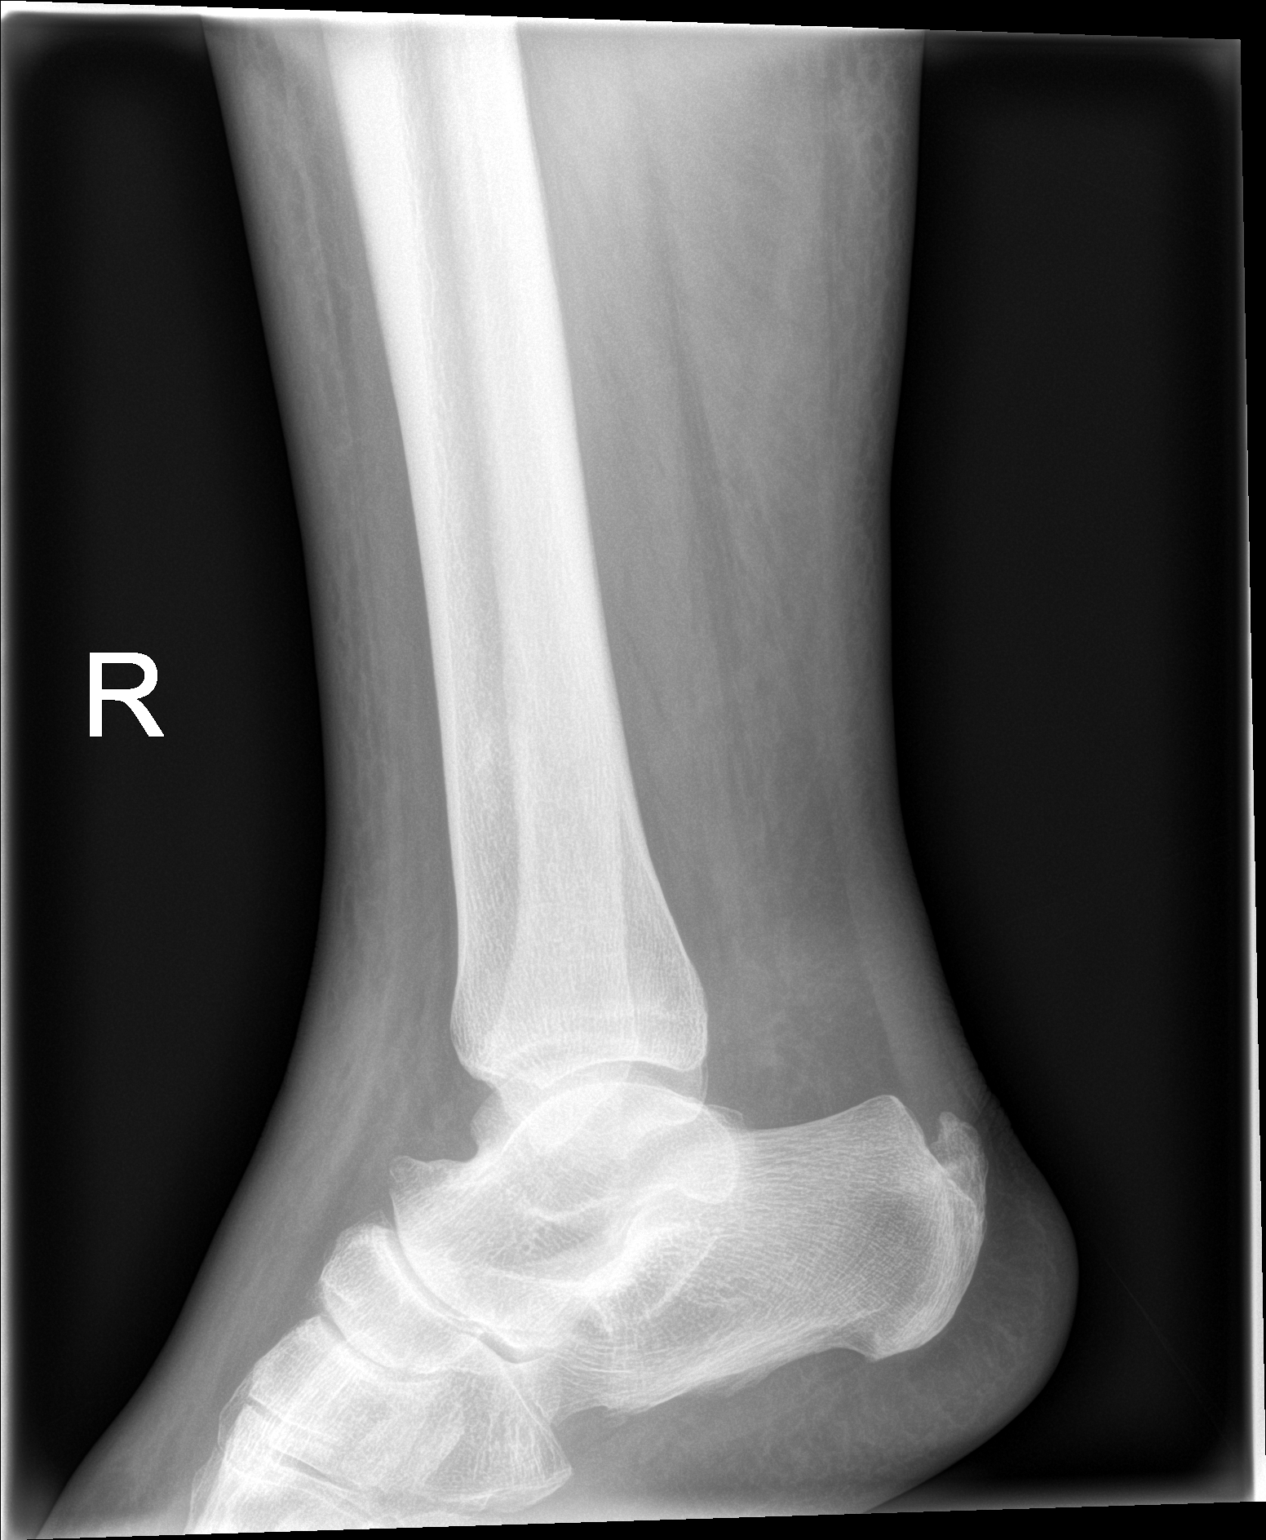

[2 of 2 positions shown; findings below may reference images not displayed]

FINDINGS: There is no evidence of fracture, dislocation, or joint effusion.
Prominent Achilles tendon enthesophyte. There is no evidence of
arthropathy or other focal bone abnormality. Generalized soft tissue
edema.
IMPRESSION: Generalized soft tissue edema. No acute osseous abnormality.

## 2019-11-23 ENCOUNTER — Ambulatory Visit: Payer: Medicare HMO

## 2019-11-24 ENCOUNTER — Ambulatory Visit: Payer: Medicare HMO

## 2019-12-09 ENCOUNTER — Telehealth: Payer: Self-pay

## 2019-12-09 NOTE — Telephone Encounter (Signed)
Pt called hysterical. She says that she has not been taking her BP as normal. She states that there is no point in her appt with PharmD on 1/26 because she has no record of her BP. She is stating that she "does not why she is alive" and that she "does not focus on  herself". She keeps stating that it is "up to her to do everything". I asked the pt if she was having thoughts of harming herself. She denied. She states that she is in a "bad place and that she needs to take care of herself and it is up to her". Asked pt if there was anyone who could help her, she denied. Advised pt to seek help and advised if she was having any thoughts of self harm to call 911. Verbalized understanding. Will route to PCP to be aware. May need further evaluation.

## 2019-12-13 ENCOUNTER — Ambulatory Visit: Payer: Medicare HMO

## 2019-12-26 ENCOUNTER — Other Ambulatory Visit: Payer: Self-pay | Admitting: Internal Medicine

## 2019-12-26 DIAGNOSIS — Z1231 Encounter for screening mammogram for malignant neoplasm of breast: Secondary | ICD-10-CM

## 2019-12-27 ENCOUNTER — Other Ambulatory Visit: Payer: Self-pay

## 2019-12-28 ENCOUNTER — Ambulatory Visit (INDEPENDENT_AMBULATORY_CARE_PROVIDER_SITE_OTHER): Payer: Medicare Other | Admitting: Obstetrics and Gynecology

## 2019-12-28 ENCOUNTER — Encounter: Payer: Self-pay | Admitting: Obstetrics and Gynecology

## 2019-12-28 VITALS — BP 134/82 | Ht 69.0 in | Wt 314.0 lb

## 2019-12-28 DIAGNOSIS — Z124 Encounter for screening for malignant neoplasm of cervix: Secondary | ICD-10-CM

## 2019-12-28 DIAGNOSIS — Z780 Asymptomatic menopausal state: Secondary | ICD-10-CM

## 2019-12-28 DIAGNOSIS — Z01419 Encounter for gynecological examination (general) (routine) without abnormal findings: Secondary | ICD-10-CM | POA: Diagnosis not present

## 2019-12-28 DIAGNOSIS — Z30431 Encounter for routine checking of intrauterine contraceptive device: Secondary | ICD-10-CM

## 2019-12-28 NOTE — Addendum Note (Signed)
Addended by: Nelva Nay on: 12/28/2019 02:59 PM   Modules accepted: Orders

## 2019-12-28 NOTE — Patient Instructions (Addendum)
Please schedule a bone density scan at your convenience Your mammogram looks to already be scheduled We will let you know about your Pap smear results

## 2019-12-28 NOTE — Progress Notes (Signed)
Kristen Shaffer 1956/06/10 [id number]  SUBJECTIVE:  64 y.o. G2P0020 female for annual routine gynecologic exam and Pap smear. She has no gynecologic concerns.  She has lichen sclerosus but has no vulvar symptoms other than very occasional itch.  She says she has a cream for the area but does not need to use it.  She has a Mirena IUD in place since roughly March 2017 place for endometrial hyperplasia.  Specifically, a D&C in December 2016 showed complex endometrial hyperplasia without atypia contained within a polyp she has had no vaginal bleeding.  Current Outpatient Medications  Medication Sig Dispense Refill  . acetaminophen (TYLENOL) 650 MG CR tablet Take 650 mg by mouth every 8 (eight) hours as needed for pain.    . Blood Pressure Monitoring (BLOOD PRESSURE CUFF) MISC 1 Package by Does not apply route daily. 1 each 0  . carvedilol (COREG) 6.25 MG tablet Take 1 tablet (6.25 mg total) by mouth 2 (two) times daily with a meal. 90 tablet 3  . Celecoxib (CELEBREX PO) Take 1 tablet by mouth daily as needed (pain).    . polyethylene glycol (MIRALAX / GLYCOLAX) 17 g packet Take 17 g by mouth daily as needed. 14 each 0  . spironolactone (ALDACTONE) 25 MG tablet Take 1 tablet (25 mg total) by mouth daily. 90 tablet 3  . torsemide (DEMADEX) 20 MG tablet Take 1 tablet (20 mg total) by mouth 2 (two) times daily. 180 tablet 3  . zinc gluconate 50 MG tablet Take 50 mg by mouth daily.    [name] amLODipine (NORVASC) 10 MG tablet Take 1 tablet (10 mg total) by mouth daily. (Patient not taking: Reported on 12/28/2019) 30 tablet 0  . doxycycline (VIBRA-TABS) 100 MG tablet Take 1 tablet (100 mg total) by mouth every 12 (twelve) hours. (Patient not taking: Reported on 12/28/2019) 6 tablet 0  . gabapentin (NEURONTIN) 300 MG capsule Take 1 capsule (300 mg total) by mouth 3 (three) times daily. (Patient not taking: Reported on 08/24/2019) 90 capsule 0  . Turmeric (QC TUMERIC COMPLEX) 500 MG CAPS Take by mouth.     No  current facility-administered medications for this visit.   Allergies: Patient has no known allergies.  No LMP recorded. Patient is postmenopausal.  Past medical history,surgical history, problem list, medications, allergies, family history and social history were all reviewed and documented as reviewed in the EPIC chart.  ROS:  Feeling well. No dyspnea or chest pain on exertion.  No abdominal pain, change in bowel habits, black or bloody stools.  No urinary tract symptoms. GYN ROS: no abnormal bleeding, pelvic pain or discharge, no breast pain or new or enlarging lumps on self exam. No neurological complaints.    OBJECTIVE:  BP 134/82 (Cuff Size: Large)   Ht 5\' 9"  (1.753 m)   Wt (!) 314 lb (142.4 kg)   BMI 46.37 kg/m  The patient appears well, alert, oriented x 3, in no distress. ENT normal.  Neck supple. No cervical or supraclavicular adenopathy or thyromegaly.  Lungs are clear, good air entry, no wheezes, rhonchi or rales. S1 and S2 normal, no murmurs, regular rate and rhythm.  Abdomen soft without tenderness, guarding, mass or organomegaly.  Neurological is normal, no focal findings.  BREAST EXAM: breasts appear normal, no suspicious masses, no skin or nipple changes or axillary nodes  PELVIC EXAM: VULVA: normal appearing vulva with no masses, tenderness or lesions, depigmentation/vitiligo over entire vulvar area, perineum, and perianal skin, no masses or lesions, VAGINA:  normal appearing vagina with normal color and discharge, no lesions, CERVIX: normal appearing cervix without discharge or lesions, IUD strings 3 cm in length.  UTERUS: uterus is normal size, shape, consistency and nontender, ADNEXA: normal adnexa in size, nontender and no masses, RECTAL: normal rectal, no masses, PAP: Pap smear done today, thin-prep method  Chaperone: [name] present during the examination  ASSESSMENT:  64 y.o. G2P0020 here for annual gynecologic exam  PLAN:   1.  Postmenopausal.  No  concerns at this time.  No abnormal bleeding. 2.  Endometrial hyperplasia.  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  She has had a Mirena IUD in for about 4 years now for treatment.  She has not had any follow-up biopsies due to lack of medical care in the past few years.  Biopsy is not performed today given her lack of symptoms at this time.  She will be requiring IUD removal at its 5-year date which should be coming up this following spring.  We could consider performing a biopsy then, or if she has any bleeding she needs to let us know and we will perform biopsy at that time. 3. Pap smear 07/2015. Pap smear is repeated today.  4. Mammogram 2016.  I recommend annual mammography, which is set up for 01/31/2020. Breast exam normal today. 5. Colonoscopy last in approximately 2016. Recommended that she continue per the prescribed interval.   6. DEXA 2011 indicated normal bone density. Recommend a follow up study at her convenience. 7. Health maintenance.  No lab work as she has this completed with her primary care provider.    Return annually or sooner, prn.  Joseph Pierini MD  12/28/19

## 2019-12-29 LAB — PAP IG W/ RFLX HPV ASCU

## 2020-01-06 ENCOUNTER — Telehealth: Payer: Self-pay | Admitting: Cardiovascular Disease

## 2020-01-06 NOTE — Telephone Encounter (Signed)
Spoke with patient of Dr. Gwenlyn Found who reports that she has not been tracking her BP consistently. She was last seen Oct 2020 and is scheduled for HTN clinic f/u March 2021. She reports she had lost her coreg for a few days in her car, cannot locate the BP form to record vital signs, and has trouble remembering to check her BP. Suggested that she check BP twice daily with each coreg dose, offered to email her a BP form but she reports she cannot print it, so I then suggested that she just record what she can in a notebook. Advised that she keep her March appt since it has now been 6 months since her last visit and she needs her BP evaluated. She voiced understanding

## 2020-01-06 NOTE — Telephone Encounter (Signed)
New Message   Patient is calling to request the blood pressure that Dr. Gwenlyn Found provided to her. Please call to discuss.

## 2020-01-17 ENCOUNTER — Ambulatory Visit: Payer: 59

## 2020-01-18 ENCOUNTER — Telehealth: Payer: Self-pay | Admitting: Cardiovascular Disease

## 2020-01-18 NOTE — Telephone Encounter (Signed)
Returned call to patient Kristen Shaffer's advice given.

## 2020-01-18 NOTE — Telephone Encounter (Signed)
New Message     Pt c/o medication issue:  1. Name of Medication: Mega CoQ10   2. How are you currently taking this medication (dosage and times per day)? Not yet taking   3. Are you having a reaction (difficulty breathing--STAT)? No   4. What is your medication issue? Pt is calling and is wondering if she can start taking this medication  She says she was told it is recommended but wants to check with Dr Gwenlyn Found before taking     Please Advise

## 2020-01-18 NOTE — Telephone Encounter (Signed)
Spoke to patient she wanted to know if ok to take Mega CoQ10.Advised I will send message to pharmacy for advice.

## 2020-01-18 NOTE — Telephone Encounter (Signed)
There are no interactions between CoQ10 and her other meds so from a safety perspective she would be ok to take CoQ10. There is not much reported benefit in its use though, so she may not receive much benefit from taking it.

## 2020-01-19 ENCOUNTER — Ambulatory Visit (INDEPENDENT_AMBULATORY_CARE_PROVIDER_SITE_OTHER): Payer: Medicare Other

## 2020-01-19 ENCOUNTER — Other Ambulatory Visit: Payer: Self-pay | Admitting: Obstetrics & Gynecology

## 2020-01-19 ENCOUNTER — Other Ambulatory Visit: Payer: Self-pay

## 2020-01-19 ENCOUNTER — Other Ambulatory Visit: Payer: Self-pay | Admitting: Obstetrics and Gynecology

## 2020-01-19 DIAGNOSIS — Z780 Asymptomatic menopausal state: Secondary | ICD-10-CM

## 2020-01-26 ENCOUNTER — Ambulatory Visit: Payer: 59 | Admitting: Obstetrics and Gynecology

## 2020-01-31 ENCOUNTER — Ambulatory Visit: Payer: 59

## 2020-01-31 NOTE — Progress Notes (Deleted)
Patient ID: Kristen Shaffer                 DOB: 1955-12-01                      MRN: [id number]     HPI: Kristen Shaffer is a 64 y.o. female referred by Dr. Gwenlyn Found to HTN clinic. PMH includes chronic pain, depression, hypertension, GERD, and lower extremity edema. Patient ran our of medication 1 week prior to OV with Dr Gwenlyn Found and her BP during visit was   Current HTN meds:  Amlodipine 10mg  daily Carvedilol 6.25mg  twice daily Spironolactone 25mg  daily Torsemide 20mg  twice daily  Previously tried:   BP goal: <130/80  Family History:   Social History:   Diet:   Exercise:   Home BP readings:   Wt Readings from Last 3 Encounters:  12/28/19 (!) 314 lb (142.4 kg)  08/24/19 (!) 305 lb (138.3 kg)  07/20/19 (!) 302 lb 7.5 oz (137.2 kg)   BP Readings from Last 3 Encounters:  12/28/19 134/82  08/24/19 (!) 169/106  07/25/19 (!) 144/85   Pulse Readings from Last 3 Encounters:  08/24/19 (!) 116  07/25/19 (!) 101  07/22/19 95    Renal function: CrCl cannot be calculated (Patient's most recent lab result is older than the maximum 21 days allowed.).  Past Medical History:  Diagnosis Date  . Anemia    history  . Arthritis    Osteoarthritis  . Asthma    Childhood  . Bilateral leg edema   . Colon polyps   . 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 Depression   . Fibroid   . GERD (gastroesophageal reflux disease)   . Herniated disc   . Hypertension    no medications at this time  . Lichen sclerosus   . PONV (postoperative nausea and vomiting)   . Rheumatic fever    in childhood  . Sciatic leg pain   . Vocal cord nodule     Current Outpatient Medications on File Prior to Visit  Medication Sig Dispense Refill  . acetaminophen (TYLENOL) 650 MG CR tablet Take 650 mg by mouth every 8 (eight) hours as needed for pain.    [name] amLODipine (NORVASC) 10 MG tablet Take 1 tablet (10 mg total) by mouth daily. (Patient not taking: Reported  on 12/28/2019) 30 tablet 0  . Blood Pressure Monitoring (BLOOD PRESSURE CUFF) MISC 1 Package by Does not apply route daily. 1 each 0  . carvedilol (COREG) 6.25 MG tablet Take 1 tablet (6.25 mg total) by mouth 2 (two) times daily with a meal. 90 tablet 3  . Celecoxib (CELEBREX PO) Take 1 tablet by mouth daily as needed (pain).    [name] doxycycline (VIBRA-TABS) 100 MG tablet Take 1 tablet (100 mg total) by mouth every 12 (twelve) hours. (Patient not taking: Reported on 12/28/2019) 6 tablet 0  . gabapentin (NEURONTIN) 300 MG capsule Take 1 capsule (300 mg total) by mouth 3 (three) times daily. (Patient not taking: Reported on 08/24/2019) 90 capsule 0  . polyethylene glycol (MIRALAX / GLYCOLAX) 17 g packet Take 17 g by mouth daily as needed. 14 each 0  . spironolactone (ALDACTONE) 25 MG tablet Take 1 tablet (25 mg total) by mouth daily. 90 tablet 3  . torsemide (DEMADEX) 20 MG tablet Take 1 tablet (20 mg total) by mouth 2 (two) times daily. 180 tablet 3  . Turmeric (QC TUMERIC COMPLEX)  500 MG CAPS Take by mouth.    . zinc gluconate 50 MG tablet Take 50 mg by mouth daily.     No current facility-administered medications on file prior to visit.    No Known Allergies  There were no vitals taken for this visit.  No problem-specific Assessment & Plan notes found for this encounter.    Kaedyn Belardo Rodriguez-Guzman PharmD, BCPS, [place] [id number] 01/31/2020 11:35 AM

## 2020-02-01 ENCOUNTER — Other Ambulatory Visit: Payer: Self-pay

## 2020-02-01 ENCOUNTER — Ambulatory Visit
Admission: RE | Admit: 2020-02-01 | Discharge: 2020-02-01 | Disposition: A | Discharge status: Home / Self Care | Payer: Medicare Other | Source: Ambulatory Visit | Attending: Internal Medicine | Admitting: Internal Medicine

## 2020-02-01 DIAGNOSIS — Z1231 Encounter for screening mammogram for malignant neoplasm of breast: Secondary | ICD-10-CM

## 2020-02-02 ENCOUNTER — Other Ambulatory Visit: Payer: Self-pay | Admitting: Internal Medicine

## 2020-02-02 DIAGNOSIS — R928 Other abnormal and inconclusive findings on diagnostic imaging of breast: Secondary | ICD-10-CM

## 2020-02-23 ENCOUNTER — Ambulatory Visit (INDEPENDENT_AMBULATORY_CARE_PROVIDER_SITE_OTHER): Payer: Medicare Other | Admitting: Pharmacist

## 2020-02-23 ENCOUNTER — Other Ambulatory Visit: Payer: Self-pay

## 2020-02-23 VITALS — BP 130/86 | HR 111 | Resp 98 | Ht 69.0 in | Wt 308.0 lb

## 2020-02-23 DIAGNOSIS — I10 Essential (primary) hypertension: Secondary | ICD-10-CM

## 2020-02-23 NOTE — Patient Instructions (Signed)
Return for a follow up appointment in 8 weeks  Check your blood pressure at home daily (if able) and keep record of the readings.  Take your BP meds as follows: *No medication changes*  Bring all of your meds, your BP cuff and your record of home blood pressures to your next appointment.  Exercise as you're able, try to walk approximately 30 minutes per day.  Keep salt intake to a minimum, especially watch canned and prepared boxed foods.  Eat more fresh fruits and vegetables and fewer canned items.  Avoid eating in fast food restaurants.    HOW TO TAKE YOUR BLOOD PRESSURE: . Rest 5 minutes before taking your blood pressure. .  Don't smoke or drink caffeinated beverages for at least 30 minutes before. . Take your blood pressure before (not after) you eat. . Sit comfortably with your back supported and both feet on the floor (don't cross your legs). . Elevate your arm to heart level on a table or a desk. . Use the proper sized cuff. It should fit smoothly and snugly around your bare upper arm. There should be enough room to slip a fingertip under the cuff. The bottom edge of the cuff should be 1 inch above the crease of the elbow. . Ideally, take 3 measurements at one sitting and record the average.

## 2020-02-23 NOTE — Progress Notes (Signed)
Patient ID: [name]                 DOB: February 05, 1956                      MRN: [id number]     HPI: Kristen Shaffer is a 64 y.o. female referred by Dr. Gwenlyn Found to HTN clinic. PMH includes chronic pain, herniated disks, depression, uncontrolled hypertension, and GERD. Patient ran out of medication 1 week prior to follow up visit with Dr. Gwenlyn Found and her BP was 169/106 at the office. Patient was instructed to resume carvedilol and spironolactone (new Rx provided). Furosemide was changed to torsemide to improve fluid management.    Patient presents to HTN clinic for evaluation and medication titration. Reports some issues with medication compliance due to fatigue and multiple doctors appointments. Also started weight management program with [facility].  Current HTN meds:  Carvedilol 6.25mg  twice daily Spironolactone 25mg  daily Torsemide 20mg  daily as needed for swollen legs (taking only 1/2 tablet)  BP goal: 130/80  Social History: denies tobacco or alcohol use  Diet: [facility] plan  Exercise: activities of daily living - used walker to ambulate and suffers from chronic pain  Home BP readings:  sadly home BP cuff is NOT accurate. Arm cuff is too small for the patient. 20 readings provided; range 120/67 to 161/81; HR 66-88bpm  Wt Readings from Last 3 Encounters:  02/23/20 (!) 308 lb (139.7 kg)  12/28/19 (!) 314 lb (142.4 kg)  08/24/19 (!) 305 lb (138.3 kg)   BP Readings from Last 3 Encounters:  02/23/20 130/90  12/28/19 134/82  08/24/19 (!) 169/106   Pulse Readings from Last 3 Encounters:  02/23/20 (!) 111  08/24/19 (!) 116  07/25/19 (!) 101    Past Medical History:  Diagnosis Date  . Anemia    history  . Arthritis    Osteoarthritis  . Asthma    Childhood  . Bilateral leg edema   . Colon polyps   . 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 Depression   . Fibroid   . GERD (gastroesophageal reflux  disease)   . Herniated disc   . Hypertension    no medications at this time  . Lichen sclerosus   . PONV (postoperative nausea and vomiting)   . Rheumatic fever    in childhood  . Sciatic leg pain   . Vocal cord nodule     Current Outpatient Medications on File Prior to Visit  Medication Sig Dispense Refill  . acetaminophen (TYLENOL) 650 MG CR tablet Take 650 mg by mouth every 8 (eight) hours as needed for pain.    . Blood Pressure Monitoring (BLOOD PRESSURE CUFF) MISC 1 Package by Does not apply route daily. 1 each 0  . carvedilol (COREG) 6.25 MG tablet Take 1 tablet (6.25 mg total) by mouth 2 (two) times daily with a meal. 90 tablet 3  . Celecoxib (CELEBREX PO) Take 1 tablet by mouth daily as needed (pain).    [name] doxycycline (VIBRA-TABS) 100 MG tablet Take 1 tablet (100 mg total) by mouth every 12 (twelve) hours. 6 tablet 0  . polyethylene glycol (MIRALAX / GLYCOLAX) 17 g packet Take 17 g by mouth daily as needed. 14 each 0  . spironolactone (ALDACTONE) 25 MG tablet Take 1 tablet (25 mg total) by mouth daily. 90 tablet 3  . torsemide (DEMADEX) 20 MG tablet Take 1 tablet (  20 mg total) by mouth 2 (two) times daily. 180 tablet 3  . Turmeric (QC TUMERIC COMPLEX) 500 MG CAPS Take by mouth.    . zinc gluconate 50 MG tablet Take 50 mg by mouth daily.     No current facility-administered medications on file prior to visit.    No Known Allergies  Blood pressure 130/90, pulse (!) 111, resp. rate (!) 98, height 5\' 9"  (1.753 m), weight (!) 308 lb (139.7 kg).  No problem-specific Assessment & Plan notes found for this encounter.   Deliliah Spranger Rodriguez-Guzman PharmD, BCPS, [place] [employer] [id number] 02/23/2020 3:10 PM

## 2020-02-27 ENCOUNTER — Encounter: Payer: Self-pay | Admitting: Pharmacist

## 2020-02-27 NOTE — Assessment & Plan Note (Signed)
Blood pressure remains slightly above goal of 130/80 during OV but significantly improved since resuming her medication as prescribed. Ms Sorace never took amlodipine and can not recall when tis medication was prescribed.   We spent long time discussing positive life style modifications like low sodium diet. We also discussesd strategies to improve medication compliance (pill box, take all morning medication at the same time, avoid water pill in the evening and prior to appointments).  Patient is on the 1st week of weight management program [facility] and reports resolution of lowe extremity edema.  Will continue all medication as previously prescribed and follow up in 8 weeks. Plan to increase carvedilol to 12.5mg  during next OV if BP remains above goal (noted HR persistent above 60bpm).

## 2020-02-29 ENCOUNTER — Ambulatory Visit
Admission: RE | Admit: 2020-02-29 | Discharge: 2020-02-29 | Disposition: A | Discharge status: Home / Self Care | Payer: Medicare Other | Source: Ambulatory Visit | Attending: Internal Medicine | Admitting: Internal Medicine

## 2020-02-29 ENCOUNTER — Other Ambulatory Visit: Payer: Medicare Other

## 2020-02-29 ENCOUNTER — Other Ambulatory Visit: Payer: Self-pay | Admitting: Internal Medicine

## 2020-02-29 ENCOUNTER — Ambulatory Visit: Admission: RE | Admit: 2020-02-29 | Payer: Medicare Other | Source: Ambulatory Visit

## 2020-02-29 DIAGNOSIS — R928 Other abnormal and inconclusive findings on diagnostic imaging of breast: Secondary | ICD-10-CM

## 2020-03-12 ENCOUNTER — Inpatient Hospital Stay: Admission: RE | Admit: 2020-03-12 | Payer: Medicare Other | Source: Ambulatory Visit

## 2020-03-12 ENCOUNTER — Other Ambulatory Visit: Payer: Medicare Other

## 2020-03-20 ENCOUNTER — Ambulatory Visit
Admission: RE | Admit: 2020-03-20 | Discharge: 2020-03-20 | Disposition: A | Discharge status: Home / Self Care | Payer: Medicare Other | Source: Ambulatory Visit | Attending: Internal Medicine | Admitting: Internal Medicine

## 2020-03-20 ENCOUNTER — Other Ambulatory Visit: Payer: Self-pay

## 2020-03-20 DIAGNOSIS — R928 Other abnormal and inconclusive findings on diagnostic imaging of breast: Secondary | ICD-10-CM

## 2020-04-17 ENCOUNTER — Telehealth: Payer: Self-pay | Admitting: Cardiovascular Disease

## 2020-04-17 DIAGNOSIS — J382 Nodules of vocal cords: Secondary | ICD-10-CM | POA: Insufficient documentation

## 2020-04-17 NOTE — Telephone Encounter (Signed)
4 weeks of therapy is appropriate for follow up

## 2020-04-17 NOTE — Telephone Encounter (Signed)
Spoke with pt, she states she had issues with her BP cuff and stated it was not fitting.She reports that due to this she had not been "doing what I was supposed to do". She states she has lost weight now and it is fitting better at this time. Notified that I spoke with our pharmacist and they are fine with just 4 weeks of readings for her follow up. Pt verbalized understanding with no other questions at this time.

## 2020-04-17 NOTE — Telephone Encounter (Signed)
Patient called to reschedule her appointment with the pharmacist because she has not been keeping up with her BP recordings. Her appointment has been rescheduled for 05/22/20 at 3:00 PM. However she would like to inquire about whether or not she needs to schedule for an August so that she has 8 full weeks of BP recordings. Please advise.

## 2020-04-19 ENCOUNTER — Ambulatory Visit: Payer: Medicare Other

## 2020-05-22 ENCOUNTER — Ambulatory Visit: Payer: Medicare Other

## 2020-05-29 ENCOUNTER — Other Ambulatory Visit: Payer: Self-pay

## 2020-05-29 MED ORDER — CARVEDILOL 6.25 MG PO TABS: 6.2500 mg | ORAL_TABLET | Freq: Two times a day (BID) | ORAL | 0 refills | Status: DC

## 2020-06-22 ENCOUNTER — Ambulatory Visit (INDEPENDENT_AMBULATORY_CARE_PROVIDER_SITE_OTHER): Payer: Medicare Other | Admitting: Pharmacist

## 2020-06-22 ENCOUNTER — Other Ambulatory Visit: Payer: Self-pay

## 2020-06-22 ENCOUNTER — Encounter: Payer: Self-pay | Admitting: Pharmacist

## 2020-06-22 VITALS — BP 126/74 | HR 80

## 2020-06-22 DIAGNOSIS — I10 Essential (primary) hypertension: Secondary | ICD-10-CM | POA: Diagnosis not present

## 2020-06-22 NOTE — Assessment & Plan Note (Signed)
Blood pressure greatly improved. Noted positive lifestyle modifications (weight loss and regular exercise) and better compliance with therapy as well.   Will continue therapy without changes, repeat BMET today, and schedule follow up with Dr Gwenlyn Found (6 month follow up overdue).

## 2020-06-22 NOTE — Progress Notes (Signed)
Patient ID: Kristen Shaffer                 DOB: [birth year]                      MRN: [id number]     HPI: Kristen Shaffer is a 64 y.o. female referred by Dr. Gwenlyn Shaffer to HTN clinic. PMH includes chronic pain, herniated disks, depression, uncontrolled hypertension, and GERD. Patient presents to HTN clinic follow up.  Patient reports 12 pounds weight loss in 2 months while on weight management program with [facility]. Compliance with mediation in the is greatly impoved and denies dizziness, increased fatigue, swelling or headaches.  Current HTN meds:  Carvedilol 6.25mg  twice daily Spironolactone 25mg  daily Torsemide 20mg  daily as needed for swollen legs (taking only 1/2 tablet)  BP goal: 130/80  Social History: denies tobacco or alcohol use  Diet: [facility] plan  Exercise: PT in pool  Home BP readings: New Medline Arm cuff - accurate within 77mmHg from manual reading 17 readings; average 128/80; HR 67-103 bpm  Wt Readings from Last 3 Encounters:  02/23/20 (!) 308 lb (139.7 kg)  12/28/19 (!) 314 lb (142.4 kg)  08/24/19 (!) 305 lb (138.3 kg)   BP Readings from Last 3 Encounters:  06/22/20 126/74  02/23/20 130/86  12/28/19 134/82   Pulse Readings from Last 3 Encounters:  06/22/20 80  02/23/20 (!) 111  08/24/19 (!) 116    Past Medical History:  Diagnosis Date  . Anemia    history  . Arthritis    Osteoarthritis  . Asthma    Childhood  . Bilateral leg edema   . Colon polyps   . 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 Depression   . Fibroid   . GERD (gastroesophageal reflux disease)   . Herniated disc   . Hypertension    no medications at this time  . Lichen sclerosus   . PONV (postoperative nausea and vomiting)   . Rheumatic fever    in childhood  . Sciatic leg pain   . Vocal cord nodule     Current Outpatient Medications on File Prior to Visit  Medication Sig Dispense Refill  . acetaminophen  (TYLENOL) 650 MG CR tablet Take 650 mg by mouth every 8 (eight) hours as needed for pain.    . Blood Pressure Monitoring (BLOOD PRESSURE CUFF) MISC 1 Package by Does not apply route daily. 1 each 0  . carvedilol (COREG) 6.25 MG tablet Take 1 tablet (6.25 mg total) by mouth 2 (two) times daily with a meal. KEEP OV AS SCHEDULED FOR FURTHER REFILLS IN OFFICE 90 tablet 0  . Celecoxib (CELEBREX PO) Take 1 tablet by mouth daily as needed (pain).    . Cholecalciferol 125 MCG (5000 UT) capsule Take 5,000 Units by mouth daily.    . methocarbamol (ROBAXIN) 750 MG tablet Take 750 mg by mouth 2 (two) times daily as needed.    [name] omeprazole (PRILOSEC) 20 MG capsule Take 20 mg by mouth 2 (two) times daily before a meal.    . polyethylene glycol (MIRALAX / GLYCOLAX) 17 g packet Take 17 g by mouth daily as needed. 14 each 0  . spironolactone (ALDACTONE) 25 MG tablet Take 1 tablet (25 mg total) by mouth daily. 90 tablet 3  . torsemide (DEMADEX) 20 MG tablet Take 1 tablet (20 mg total) by mouth 2 (two) times daily. 180 tablet 3  .  amitriptyline (ELAVIL) 25 MG tablet Take 25 mg by mouth at bedtime. 25-50mg  (Patient not taking: Reported on 06/22/2020)     No current facility-administered medications on file prior to visit.    No Known Allergies  Blood pressure 126/74, pulse 80, SpO2 98 %.  Hypertension Blood pressure greatly improved. Noted positive lifestyle modifications (weight loss and regular exercise) and better compliance with therapy as well.   Will continue therapy without changes, repeat BMET today, and schedule follow up with Dr Kristen Shaffer (6 month follow up overdue).   Kristen Shaffer PharmD, BCPS, [place] [id number] 06/22/2020 3:33 PM

## 2020-06-22 NOTE — Patient Instructions (Addendum)
Return for a follow up appointment in AS NEEDED (Next with Dr Gwenlyn Found)  Check your blood pressure at home daily (if able) and keep record of the readings.  *Okay ton take zinc tablets for 2 weeks, then stop  Blood work: TODAY  Take your BP meds as follows: *no change*   Bring all of your meds, your BP cuff and your record of home blood pressures to your next appointment.  Exercise as you're able, try to walk approximately 30 minutes per day.  Keep salt intake to a minimum, especially watch canned and prepared boxed foods.  Eat more fresh fruits and vegetables and fewer canned items.  Avoid eating in fast food restaurants.    HOW TO TAKE YOUR BLOOD PRESSURE: . Rest 5 minutes before taking your blood pressure. .  Don't smoke or drink caffeinated beverages for at least 30 minutes before. . Take your blood pressure before (not after) you eat. . Sit comfortably with your back supported and both feet on the floor (don't cross your legs). . Elevate your arm to heart level on a table or a desk. . Use the proper sized cuff. It should fit smoothly and snugly around your bare upper arm. There should be enough room to slip a fingertip under the cuff. The bottom edge of the cuff should be 1 inch above the crease of the elbow. . Ideally, take 3 measurements at one sitting and record the average.

## 2020-06-23 LAB — BASIC METABOLIC PANEL
BUN/Creatinine Ratio: 18 (ref 12–28)
BUN: 20 mg/dL (ref 8–27)
CO2: 23 mmol/L (ref 20–29)
Calcium: 9.8 mg/dL (ref 8.7–10.3)
Chloride: 102 mmol/L (ref 96–106)
Creatinine, Ser: 1.11 mg/dL — ABNORMAL HIGH (ref 0.57–1.00)
GFR calc Af Amer: 60 mL/min/{1.73_m2} (ref >59)
GFR calc non Af Amer: 52 mL/min/{1.73_m2} — ABNORMAL LOW (ref >59)
Glucose: 93 mg/dL (ref 65–99)
Potassium: 4.2 mmol/L (ref 3.5–5.2)
Sodium: 143 mmol/L (ref 134–144)

## 2020-07-02 ENCOUNTER — Other Ambulatory Visit: Payer: Self-pay | Admitting: Cardiovascular Disease

## 2020-08-01 ENCOUNTER — Other Ambulatory Visit: Payer: Self-pay

## 2020-08-01 ENCOUNTER — Encounter: Payer: Self-pay | Admitting: Cardiovascular Disease

## 2020-08-01 ENCOUNTER — Ambulatory Visit (INDEPENDENT_AMBULATORY_CARE_PROVIDER_SITE_OTHER): Payer: Medicare Other | Admitting: Cardiovascular Disease

## 2020-08-01 VITALS — BP 134/84 | HR 98 | Ht 69.0 in | Wt 296.0 lb

## 2020-08-01 DIAGNOSIS — I10 Essential (primary) hypertension: Secondary | ICD-10-CM | POA: Diagnosis not present

## 2020-08-01 DIAGNOSIS — I5189 Other ill-defined heart diseases: Secondary | ICD-10-CM

## 2020-08-01 NOTE — Assessment & Plan Note (Signed)
History of essential hypertension a blood pressure measured today 134/84.  She just took her pills just prior to coming into the office.  She is on carvedilol, torsemide and spironolactone.  She is watching her diet.

## 2020-08-01 NOTE — Patient Instructions (Signed)
Medication Instructions:  No Changes In Medications at this time.  *If you need a refill on your cardiac medications before your next appointment, please call your pharmacy*  Lab Work: None Ordered At This Time.  If you have labs (blood work) drawn today and your tests are completely normal, you will receive your results only by: MyChart Message (if you have MyChart) OR A paper copy in the mail If you have any lab test that is abnormal or we need to change your treatment, we will call you to review the results.  Testing/Procedures: None Ordered At This Time.   Follow-Up: At [facility], you and your health needs are our priority.  As part of our continuing mission to provide you with exceptional heart care, we have created designated Provider Care Teams.  These Care Teams include your primary Cardiologist (physician) and Advanced Practice Providers (APPs -  Physician Assistants and Nurse Practitioners) who all work together to provide you with the care you need, when you need it.  We recommend signing up for the patient portal called "MyChart".  Sign up information is provided on this After Visit Summary.  MyChart is used to connect with patients for Virtual Visits (Telemedicine).  Patients are able to view lab/test results, encounter notes, upcoming appointments, etc.  Non-urgent messages can be sent to your provider as well.   To learn more about what you can do with MyChart, go to [email or web address].    Your next appointment:   1 year(s)  The format for your next appointment:   In Person  Provider:   Jonathan Berry, MD            

## 2020-08-01 NOTE — Assessment & Plan Note (Signed)
History of diastolic dysfunction with 2D echo performed 02/05/2019 revealing normal LV dysfunction with diastolic dysfunction.  She is on diuretics.  Her peripheral edema which she has had the past has resolved.

## 2020-08-01 NOTE — Progress Notes (Signed)
08/01/2020 Kristen Shaffer   September 03, 1956  [id number]  Primary Physician Nolene Ebbs, MD Primary Cardiologist: [name] Harp MD [name], [place]  HPI:  Kristen Shaffer is a 64 y.o.  morbidly overweight single African-American female with no biologic children who has been disabled since age 41.  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  She has chronic pain because of her back as well as long bouts of depression.  She does have a history of hypertension.  She is never had a heart tach or stroke.  She has had GERD as well.  She was hospitalized in August because of UTI and lower extreme edema.  She is found to be COVID positive.  She was treated with diuretics.  2D echo revealed normal LV systolic function with diastolic dysfunction.   Since I saw her in the office a year ago she has been seen in the weight loss clinic and is slowly losing weight.  Her diet has been modified.  She is been seeing our Pharm.D.'s in the office for blood pressure monitoring and medication adjustments.  Blood pressures have been under better control.  She feels clinically improved.  I did ask her questions regarding sleep apnea and apparently she is been tested in the past and does not have this clinically.  Current Meds  Medication Sig  . acetaminophen (TYLENOL) 650 MG CR tablet Take 650 mg by mouth every 8 (eight) hours as needed for pain.  . Blood Pressure Monitoring (BLOOD PRESSURE CUFF) MISC 1 Package by Does not apply route daily.  . carvedilol (COREG) 6.25 MG tablet Take 1 tablet (6.25 mg total) by mouth 2 (two) times daily with a meal. KEEP OV AS SCHEDULED FOR FURTHER REFILLS IN OFFICE  . Celecoxib (CELEBREX PO) Take 1 tablet by mouth daily as needed (pain).  . Cholecalciferol 125 MCG (5000 UT) capsule Take 5,000 Units by mouth daily.  . methocarbamol (ROBAXIN) 750 MG tablet Take 750 mg by mouth 2 (two) times daily as needed.  [name] omeprazole (PRILOSEC) 20 MG capsule Take 20 mg by mouth 2 (two) times  daily before a meal.  . polyethylene glycol (MIRALAX / GLYCOLAX) 17 g packet Take 17 g by mouth daily as needed.  [name] spironolactone (ALDACTONE) 25 MG tablet TAKE 1 TABLET EVERY DAY  . torsemide (DEMADEX) 20 MG tablet Take 1 tablet (20 mg total) by mouth 2 (two) times daily.     No Known Allergies  Social History   Socioeconomic History  . Marital status: Single    Spouse name: Not on file  . Number of children: Not on file  . Years of education: Not on file  . Highest education level: Not on file  Occupational History  . Not on file  Tobacco Use  . Smoking status: Never Smoker  . Smokeless tobacco: Never Used  Substance and Sexual Activity  . Alcohol use: Yes    Alcohol/week: 0.0 standard drinks    Comment: occ  . Drug use: No  . Sexual activity: Not Currently    Birth control/protection: Post-menopausal    Comment: 1st intercourse 71 yo-5 partners-Mirena 2018?  Other Topics Concern  . Not on file  Social History Narrative  . Not on file   Social Determinants of Health   Financial Resource Strain:   . Difficulty of Paying Living Expenses: Not on file  Food Insecurity:   . Worried About Charity fundraiser in the Last Year: Not on  file  . [place] in the Last Year: Not on file  Transportation Needs:   . Lack of Transportation (Medical): Not on file  . Lack of Transportation (Non-Medical): Not on file  Physical Activity:   . Days of Exercise per Week: Not on file  . Minutes of Exercise per Session: Not on file  Stress:   . Feeling of Stress : Not on file  Social Connections:   . Frequency of Communication with Friends and Family: Not on file  . Frequency of Social Gatherings with Friends and Family: Not on file  . Attends Religious Services: Not on file  . Active Member of Clubs or Organizations: Not on file  . Attends Archivist Meetings: Not on file  . Marital Status: Not on file  Intimate Partner Violence:   . Fear of Current or Ex-Partner:  Not on file  . Emotionally Abused: Not on file  . Physically Abused: Not on file  . Sexually Abused: Not on file     Review of Systems: General: negative for chills, fever, night sweats or weight changes.  Cardiovascular: negative for chest pain, dyspnea on exertion, edema, orthopnea, palpitations, paroxysmal nocturnal dyspnea or shortness of breath Dermatological: negative for rash Respiratory: negative for cough or wheezing Urologic: negative for hematuria Abdominal: negative for nausea, vomiting, diarrhea, bright red blood per rectum, melena, or hematemesis Neurologic: negative for visual changes, syncope, or dizziness All other systems reviewed and are otherwise negative except as noted above.    Blood pressure 134/84, pulse 98, height 5\' 9"  (1.753 m), weight 296 lb (134.3 kg), SpO2 99 %.  General appearance: alert and no distress Neck: no adenopathy, no carotid bruit, no JVD, supple, symmetrical, trachea midline and thyroid not enlarged, symmetric, no tenderness/mass/nodules Lungs: clear to auscultation bilaterally Heart: regular rate and rhythm, S1, S2 normal, no murmur, click, rub or gallop Extremities: extremities normal, atraumatic, no cyanosis or edema Pulses: 2+ and symmetric Skin: Skin color, texture, turgor normal. No rashes or lesions Neurologic: Alert and oriented X 3, normal strength and tone. Normal symmetric reflexes. Normal coordination and gait  EKG sinus rhythm at 98 with right bundle branch block.  I personally reviewed this EKG.  ASSESSMENT AND PLAN:   Morbid obesity ([place]) History of morbid obesity currently being seen at weight loss clinic with some progress.  She currently weighs 296 pounds.  Hypertension History of essential hypertension a blood pressure measured today 134/84.  She just took her pills just prior to coming into the office.  She is on carvedilol, torsemide and spironolactone.  She is watching her diet.  Diastolic dysfunction History of  diastolic dysfunction with 2D echo performed 02/05/2019 revealing normal LV dysfunction with diastolic dysfunction.  She is on diuretics.  Her peripheral edema which she has had the past has resolved.      [name] Harp MD FACP,FACC,FAHA, [facility] 08/01/2020 3:59 PM

## 2020-08-01 NOTE — Assessment & Plan Note (Signed)
History of morbid obesity currently being seen at weight loss clinic with some progress.  She currently weighs 296 pounds.

## 2020-08-08 ENCOUNTER — Other Ambulatory Visit: Payer: Self-pay | Admitting: Cardiovascular Disease

## 2020-08-27 ENCOUNTER — Encounter: Payer: Self-pay | Admitting: Obstetrics and Gynecology

## 2020-08-27 ENCOUNTER — Ambulatory Visit (INDEPENDENT_AMBULATORY_CARE_PROVIDER_SITE_OTHER): Payer: Medicare Other | Admitting: Obstetrics and Gynecology

## 2020-08-27 ENCOUNTER — Other Ambulatory Visit: Payer: Self-pay

## 2020-08-27 VITALS — BP 130/80

## 2020-08-27 DIAGNOSIS — L900 Lichen sclerosus et atrophicus: Secondary | ICD-10-CM | POA: Diagnosis not present

## 2020-08-27 DIAGNOSIS — F5231 Female orgasmic disorder: Secondary | ICD-10-CM

## 2020-08-27 MED ORDER — CLOBETASOL PROPIONATE 0.05 % EX CREA: 1.0000 "application " | TOPICAL_CREAM | Freq: Every evening | CUTANEOUS | 2 refills | Status: DC

## 2020-08-27 NOTE — Patient Instructions (Signed)
Apply the cream to the irritated vulvar area nightly for 1 month, every other night for the next month, and then 2 nights per week for symptom control

## 2020-08-27 NOTE — Progress Notes (Signed)
   Kristen Shaffer [birth year] [id number]  SUBJECTIVE:  64 y.o. G2P0020 female presents for increase in chronic irritation of the vulvar area and also into the anal area.  She does have biopsy-proven lichen sclerosis.  She has not really been using any topical therapies and then her primary care provider put her on triamcinolone topical which is not helped and takes a long time to decrease symptoms.  She also has resumed sexual activity after about 16 years and today she shares she has never really discussed with any healthcare provider that she never really has experienced an orgasm.  Current Outpatient Medications  Medication Sig Dispense Refill  . acetaminophen (TYLENOL) 650 MG CR tablet Take 650 mg by mouth every 8 (eight) hours as needed for pain.    [name] amitriptyline (ELAVIL) 25 MG tablet Take 25 mg by mouth at bedtime. 25-50mg     . Blood Pressure Monitoring (BLOOD PRESSURE CUFF) MISC 1 Package by Does not apply route daily. 1 each 0  . carvedilol (COREG) 6.25 MG tablet TAKE 1 TABLET TWICE DAILY WITH MEALS 180 tablet 3  . Celecoxib (CELEBREX PO) Take 1 tablet by mouth daily as needed (pain).    . Cholecalciferol 125 MCG (5000 UT) capsule Take 5,000 Units by mouth daily.    . methocarbamol (ROBAXIN) 750 MG tablet Take 750 mg by mouth 2 (two) times daily as needed.    [name] omeprazole (PRILOSEC) 20 MG capsule Take 20 mg by mouth 2 (two) times daily before a meal.    . polyethylene glycol (MIRALAX / GLYCOLAX) 17 g packet Take 17 g by mouth daily as needed. 14 each 0  . spironolactone (ALDACTONE) 25 MG tablet TAKE 1 TABLET EVERY DAY 90 tablet 1  . triamcinolone (KENALOG) 0.025 % cream Apply 1 application topically 2 (two) times daily.    [name] torsemide (DEMADEX) 20 MG tablet Take 1 tablet (20 mg total) by mouth 2 (two) times daily. 180 tablet 3   No current facility-administered medications for this visit.   Allergies: Patient has no known allergies.  No LMP recorded. Patient is postmenopausal.  Past  medical history,surgical history, problem list, medications, allergies, family history and social history were all reviewed and documented as reviewed in the EPIC chart.  ROS: Pertinent positives and negatives as reviewed in HPI    OBJECTIVE:  BP 130/80 (Cuff Size: Large)  The patient appears well, alert, oriented, in no distress. PELVIC EXAM: VULVA: normal appearing vulva with no masses, tenderness or lesions, depigmentation/vitiligo over entire vulvar area, perineum, and perianal skin, no masses or lesions, hooded appearance over clitoris  Chaperone: Kristen Shaffer present during the examination  ASSESSMENT:  64 y.o. G2P0020 here for management of chronic vulvar irritation due to lichen sclerosis, anorgasmia  PLAN:  Recommend trying clobetasol 0.05% cream nightly for 1 month, then every other night for the next month, and then twice weekly after that.  Follow-up if no improvement in symptoms. Regards to anorgasmia, we discussed that hooded clitoris could have something do with this, otherwise her anatomy is normal.  She says digital and self stimulation has not produced any sensation.  Also with history of major depression which would also present barriers to achieving sexual pleasure/orgasm in many cases.  We discussed that while this could be an anatomic issue, it is a complex issue so if she wants to delve further into it would recommend counseling with sexual education/therapy.     Joseph Pierini MD 08/27/20

## 2020-08-31 ENCOUNTER — Telehealth: Payer: Self-pay | Admitting: *Deleted

## 2020-08-31 NOTE — Telephone Encounter (Signed)
Patient called stating [facility] doesn't have her Rx for temovate cream 0.05%. I called [facility] and they have Rx. Patient informed.

## 2020-10-16 ENCOUNTER — Other Ambulatory Visit: Payer: Self-pay | Admitting: Internal Medicine

## 2020-12-10 ENCOUNTER — Other Ambulatory Visit: Payer: Self-pay | Admitting: Cardiovascular Disease

## 2020-12-28 ENCOUNTER — Encounter: Payer: Medicare Other | Admitting: Obstetrics and Gynecology

## 2021-01-01 ENCOUNTER — Other Ambulatory Visit: Payer: Self-pay

## 2021-01-01 ENCOUNTER — Ambulatory Visit (INDEPENDENT_AMBULATORY_CARE_PROVIDER_SITE_OTHER): Payer: Medicare Other | Admitting: Obstetrics and Gynecology

## 2021-01-01 ENCOUNTER — Encounter: Payer: Self-pay | Admitting: Obstetrics and Gynecology

## 2021-01-01 VITALS — BP 128/76 | HR 84 | Ht 68.0 in | Wt 295.0 lb

## 2021-01-01 DIAGNOSIS — L900 Lichen sclerosus et atrophicus: Secondary | ICD-10-CM

## 2021-01-01 DIAGNOSIS — Z01419 Encounter for gynecological examination (general) (routine) without abnormal findings: Secondary | ICD-10-CM | POA: Diagnosis not present

## 2021-01-01 DIAGNOSIS — Z8742 Personal history of other diseases of the female genital tract: Secondary | ICD-10-CM

## 2021-01-01 DIAGNOSIS — Z30431 Encounter for routine checking of intrauterine contraceptive device: Secondary | ICD-10-CM

## 2021-01-01 DIAGNOSIS — N909 Noninflammatory disorder of vulva and perineum, unspecified: Secondary | ICD-10-CM

## 2021-01-01 DIAGNOSIS — Z780 Asymptomatic menopausal state: Secondary | ICD-10-CM

## 2021-01-01 MED ORDER — TRIAMCINOLONE ACETONIDE 0.025 % EX CREA: 1.0000 "application " | TOPICAL_CREAM | Freq: Two times a day (BID) | CUTANEOUS | 1 refills | Status: DC

## 2021-01-01 NOTE — Progress Notes (Signed)
Kristen Shaffer May 10, 1956 [id number]  SUBJECTIVE:  65 y.o. G2P0020 female for annual routine breast and pelvic exam. She has lichen sclerosus and after last year's appointment was prescribed clobetasol cream but found to much irritation because of previously scratching the area, so her primary gave her triamcinolone cream which she found to be less irritating and she does use that a few times per day to get control of the itching and irritation symptoms.  She does frequently use a washcloth to scrub her bottom area.  She is sexually active with a partner of the last several months.  She has been having a few particularly painful and sore areas especially in the perineum towards the perianal area.  She has a Mirena IUD in place since roughly March 2017 place for endometrial hyperplasia.  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  Continues to not have any vaginal bleeding.  Current Outpatient Medications  Medication Sig Dispense Refill  . acetaminophen (TYLENOL) 650 MG CR tablet Take 650 mg by mouth every 8 (eight) hours as needed for pain.    [name] amitriptyline (ELAVIL) 25 MG tablet Take 25 mg by mouth at bedtime. 25-50mg     . Blood Pressure Monitoring (BLOOD PRESSURE CUFF) MISC 1 Package by Does not apply route daily. 1 each 0  . carvedilol (COREG) 6.25 MG tablet TAKE 1 TABLET TWICE DAILY WITH MEALS 180 tablet 3  . Celecoxib (CELEBREX PO) Take 1 tablet by mouth daily as needed (pain).    . Cholecalciferol 125 MCG (5000 UT) capsule Take 5,000 Units by mouth daily.    [name] levonorgestrel (MIRENA, 52 MG,) 20 MCG/24HR IUD Mirena 20 mcg/24 hours (7 yrs) 52 mg intrauterine device  Take 1 device by intrauterine route.    . methocarbamol (ROBAXIN) 750 MG tablet Take 750 mg by mouth 2 (two) times daily as needed.    [name] omeprazole (PRILOSEC) 20 MG capsule Take 20 mg by mouth 2 (two) times daily before a meal.    . polyethylene glycol (MIRALAX / GLYCOLAX)  17 g packet Take 17 g by mouth daily as needed. 14 each 0  . spironolactone (ALDACTONE) 25 MG tablet TAKE 1 TABLET EVERY DAY 90 tablet 1  . torsemide (DEMADEX) 20 MG tablet Take 1 tablet (20 mg total) by mouth 2 (two) times daily. 180 tablet 3  . triamcinolone (KENALOG) 0.025 % cream Apply 1 application topically 2 (two) times daily.    . clobetasol cream (TEMOVATE) 1.44 % Apply 1 application topically at bedtime. Apply to external vulvar area where there is irritation, after 1 month decrease use to every other night for 1 month, then twice weekly after that (Patient not taking: Reported on 01/01/2021) 60 g 2   No current facility-administered medications for this visit.   Allergies: Patient has no known allergies.  No LMP recorded. Patient is postmenopausal.  Past medical history,surgical history, problem list, medications, allergies, family history and social history were all reviewed and documented as reviewed in the EPIC chart.  ROS: Pertinent positives and negatives as reviewed in HPI  OBJECTIVE:  BP 128/76 (BP Location: Left Arm, Patient Position: Sitting, Cuff Size: Normal)   Pulse 84   Ht 5\' 8"  (1.727 m)   Wt 295 lb (133.8 kg)   BMI 44.85 kg/m  The patient appears well, alert, oriented, in no distress.  BREAST EXAM: breasts appear normal, no suspicious masses, no skin or nipple changes or axillary nodes  PELVIC EXAM:  VULVA: normal appearing vulva with no masses, tenderness or lesions, symmetrical depigmentation/vitiligo over entire vulvar area, perineum, and perianal skin, there are several glistening possibly ulcerative lesions present in the bilateral perineum (HSV swab collected), no masses, VAGINA: normal appearing vagina with atrophic change, normal color and discharge, no lesions, CERVIX: normal appearing cervix without discharge or lesions, IUD strings 3 cm in length.  UTERUS: Unable to palpate uterus, ADNEXA: Limited exam but nontender and no palpable masses  Chaperone:  Terence Lux present during the examination  ASSESSMENT:  65 y.o. G2P0020 here for annual breast and pelvic exam  PLAN:   1.  Postmenopausal.  No difficult hot flashes or night sweats.  No vaginal bleeding. 2.  Endometrial hyperplasia.  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  She has had a Mirena IUD in for about 5 years now for treatment.  We discussed make an appointment for replacing the Mirena IUD for ongoing progestin exposure to treat the endometrial hyperplasia.  Biopsy is not performed today given her continued lack of vaginal bleeding.  I instructed the patient to check with our staff at checkout regarding starting the prior authorization process for getting a new levonorgestrel IUD placed and she will plan to do so at checkout today. 3.  Lichen sclerosus.  Proven by biopsy per Dr. Loetta Rough note.  So with possibly ulcerative lesions versus excoriation with associated pain, so we went ahead and collected HSV probe from these lesions today to rule that out.  Follow-up on the results when available.  She will continue using the triamcinolone cream 0.025% twice daily, I offered to give her a different more potent steroid preparation or type but she would like to just continue with the triamcinolone for now since she is getting some relief with it.  Recommended avoiding aggressive scrubbing on the bottom as this may tend to irritate her condition. 4. Pap smear 12/2019.  Current screening guidelines reviewed.  Will revisit at the 3-year interval if she desires to continue with Pap smear screening or not. 5. Mammogram 01/2020.  Right breast biopsy 03/2020 indicated fibrocystic changes.  I recommend continuing annual mammography reminded her to schedule when due.  Normal breast exam today. 6. Colonoscopy last in approximately 2016. Recommended that she continue per the prescribed interval.   7. DEXA 01/2020 indicated normal bone density. Recommend a follow up study at 5 year interval. 8. Health  maintenance.  No lab work as she has this completed elsewhere. The patient is aware that I will only be at this practice until early March 2022 so she knows to make sure she requests follow-up on results for any medical tests that she does when I am no longer at the practice.   Return annually or sooner, prn.  Joseph Pierini MD  01/01/21

## 2021-01-03 LAB — SURESWAB HSV, TYPE 1/2 DNA, PCR
HSV 1 DNA: NOT DETECTED
HSV 2 DNA: NOT DETECTED

## 2021-01-12 ENCOUNTER — Other Ambulatory Visit: Payer: Self-pay

## 2021-01-12 ENCOUNTER — Encounter (HOSPITAL_COMMUNITY): Payer: Self-pay

## 2021-01-12 ENCOUNTER — Emergency Department (HOSPITAL_COMMUNITY)
Admission: EM | Admit: 2021-01-12 | Discharge: 2021-01-12 | Disposition: A | Discharge status: Home / Self Care | Payer: Medicare Other | Attending: Emergency Medicine | Admitting: Emergency Medicine

## 2021-01-12 DIAGNOSIS — I10 Essential (primary) hypertension: Secondary | ICD-10-CM | POA: Diagnosis not present

## 2021-01-12 DIAGNOSIS — J45909 Unspecified asthma, uncomplicated: Secondary | ICD-10-CM | POA: Diagnosis not present

## 2021-01-12 DIAGNOSIS — Z8616 Personal history of COVID-19: Secondary | ICD-10-CM | POA: Insufficient documentation

## 2021-01-12 DIAGNOSIS — M5442 Lumbago with sciatica, left side: Secondary | ICD-10-CM

## 2021-01-12 DIAGNOSIS — Z79899 Other long term (current) drug therapy: Secondary | ICD-10-CM | POA: Diagnosis not present

## 2021-01-12 DIAGNOSIS — M25552 Pain in left hip: Secondary | ICD-10-CM | POA: Diagnosis present

## 2021-01-12 MED ORDER — PREDNISONE 20 MG PO TABS
40.0000 mg | ORAL_TABLET | Freq: Every day | ORAL | 0 refills | Status: DC
Start: 2021-01-12 — End: 2021-02-06

## 2021-01-12 MED ORDER — KETOROLAC TROMETHAMINE 15 MG/ML IJ SOLN
15.0000 mg | Freq: Once | INTRAMUSCULAR | Status: AC
  Administered 2021-01-12: 15 mg via INTRAVENOUS
  Filled 2021-01-12: qty 1

## 2021-01-12 MED ORDER — DEXAMETHASONE SODIUM PHOSPHATE 10 MG/ML IJ SOLN
10.0000 mg | Freq: Once | INTRAMUSCULAR | Status: AC
  Administered 2021-01-12: 10 mg via INTRAVENOUS
  Filled 2021-01-12: qty 1

## 2021-01-12 MED ORDER — METHYL SALICYLATE-LIDO-MENTHOL 4-4-5 % EX PTCH
1.0000 | MEDICATED_PATCH | Freq: Two times a day (BID) | CUTANEOUS | 0 refills | Status: DC
Start: 2021-01-12 — End: 2021-05-21

## 2021-01-12 MED ORDER — DICLOFENAC EPOLAMINE 1.3 % EX PTCH
1.0000 | MEDICATED_PATCH | Freq: Once | CUTANEOUS | Status: DC
  Administered 2021-01-12: 1 via TRANSDERMAL
  Filled 2021-01-12: qty 1
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
## 2021-01-12 NOTE — Discharge Instructions (Signed)
As discussed, your evaluation today has been largely reassuring.  But, it is important that you monitor your condition carefully, and do not hesitate to return to the ED if you develop new, or concerning changes in your condition. ? ?Otherwise, please follow-up with your physician for appropriate ongoing care. ? ?

## 2021-01-12 NOTE — ED Triage Notes (Signed)
Pt reports left hip pain that radiates down her leg.

## 2021-01-31 ENCOUNTER — Telehealth: Payer: Self-pay | Admitting: *Deleted

## 2021-01-31 NOTE — Telephone Encounter (Signed)
Patient called and left message asking for a call back. I called and received patient voicemail. I asked her to call me.

## 2021-02-05 NOTE — Telephone Encounter (Signed)
I called patient but she said someone had already called her back and she has appointment scheduled.

## 2021-02-06 ENCOUNTER — Ambulatory Visit (INDEPENDENT_AMBULATORY_CARE_PROVIDER_SITE_OTHER): Payer: Medicare Other | Admitting: Obstetrics and Gynecology

## 2021-02-06 ENCOUNTER — Other Ambulatory Visit: Payer: Self-pay

## 2021-02-06 ENCOUNTER — Encounter: Payer: Self-pay | Admitting: Obstetrics and Gynecology

## 2021-02-06 VITALS — BP 110/76 | HR 102 | Ht 69.0 in | Wt 292.0 lb

## 2021-02-06 DIAGNOSIS — F5231 Female orgasmic disorder: Secondary | ICD-10-CM

## 2021-02-06 DIAGNOSIS — N941 Unspecified dyspareunia: Secondary | ICD-10-CM

## 2021-02-06 DIAGNOSIS — IMO0002 Reserved for concepts with insufficient information to code with codable children: Secondary | ICD-10-CM

## 2021-02-06 DIAGNOSIS — E281 Androgen excess: Secondary | ICD-10-CM | POA: Diagnosis not present

## 2021-02-06 NOTE — Progress Notes (Signed)
GYNECOLOGY  VISIT   HPI: 65 y.o.   Single Black or African American Not Hispanic or Latino  female   [phone] with No LMP recorded. Patient is postmenopausal.   here for elevated testosterone level done at [facility].    Prior to menopause her cycles were q 26 days x 5-6 days.   She has a h/o severe depression for several years, occurred several different times. She thinks the depression might be from hormonal changes. She also has a H/O chronic pain since she was 10. Her depression started in her early 59's.   She went to [facility] to find out what her hormone levels were. She was wanting to loose weight. She never had improvement in her depression with medication, so she thought it might be her hormones. Not depressed since 8/20.  01/16/21 labs: FSH 33.4 Total testosterone 170 ng/dl  Free testosterone 0.8 pg/ml  Estradiol 41.2 (menopausal range)  She has some hair growth on her chin, noticed it a year ago. Just random hairs. She has always had hair on her nipples, not on her lower abdomen. Mild acne.  No deepening of her voice.   She has gone to [facility] for weight loss and has lost 20 lbs in a year. She goes to PT 2 x a week, has gone from a walker to a cane.     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   She became sexually active last April, she enjoys intercourse but has never had an orgasm. She has a good libido. Dating a younger man. She sometimes has discomfort on her perineum with sex, sounds like it depends on his position. No issues with ED. No vaginal pain, states she is well lubricated. It just hurts externally on her perineum.    GYNECOLOGIC HISTORY: No LMP recorded. Patient is postmenopausal. Contraception:pmp and IUD  Menopausal hormone therapy: none         OB History    Gravida  2   Para      Term      Preterm      AB  2   Living  0     SAB      IAB      Ectopic      Multiple      Live Births                  Patient Active Problem List   Diagnosis Date Noted  . Diastolic dysfunction 74/25/9563  . Leg swelling   . Morbid obesity ([place]) 06/11/2019  . Weakness   . Acute respiratory failure with hypoxia ([place]) 06/09/2019  . COVID-19 virus infection 06/08/2019  . GERD (gastroesophageal reflux disease)   . UTI (urinary tract infection)   . AKI (acute kidney injury) ([place])   . Arthritis   . Hypertension   . Depression   . Fibroid     Past Medical History:  Diagnosis Date  . Anemia    history  . Arthritis    Osteoarthritis  . Asthma    Childhood  . Bilateral leg edema   . Colon polyps   . 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 Depression   . Fibroid   . GERD (gastroesophageal reflux disease)   . Herniated disc   . Hypertension    no medications at this time  . Lichen sclerosus   . PONV (postoperative nausea and  vomiting)   . Rheumatic fever    in childhood  . Sciatic leg pain   . Vocal cord nodule     Past Surgical History:  Procedure Laterality Date  . COLONOSCOPY    . DILATATION & CURETTAGE/HYSTEROSCOPY WITH MYOSURE N/A 10/23/2015   Procedure: DILATATION & CURETTAGE/HYSTEROSCOPY WITH MYOSURE;  Surgeon: Anastasio Auerbach, MD;  Location: [facility];  Service: Gynecology;  Laterality: N/A;  . DILATION AND CURETTAGE OF UTERUS    . HYSTEROSCOPY    . MYOMECTOMY  1999  . TONSILLECTOMY      Current Outpatient Medications  Medication Sig Dispense Refill  . acetaminophen (TYLENOL) 650 MG CR tablet Take 650 mg by mouth every 8 (eight) hours as needed for pain.    [name] amitriptyline (ELAVIL) 25 MG tablet Take 25 mg by mouth at bedtime. 25-50mg     . Blood Pressure Monitoring (BLOOD PRESSURE CUFF) MISC 1 Package by Does not apply route daily. 1 each 0  . carvedilol (COREG) 6.25 MG tablet TAKE 1 TABLET TWICE DAILY WITH MEALS 180 tablet 3  . Celecoxib (CELEBREX PO) Take 1 tablet by mouth daily as needed (pain).    .  Cholecalciferol 125 MCG (5000 UT) capsule Take 5,000 Units by mouth daily.    [name] levonorgestrel (MIRENA, 52 MG,) 20 MCG/24HR IUD Mirena 20 mcg/24 hours (7 yrs) 52 mg intrauterine device  Take 1 device by intrauterine route.    . methocarbamol (ROBAXIN) 750 MG tablet Take 750 mg by mouth 2 (two) times daily as needed.    . Methyl Salicylate-Lido-Menthol 4-4-5 % PTCH Apply 1 patch topically in the morning and at bedtime. 30 patch 0  . polyethylene glycol (MIRALAX / GLYCOLAX) 17 g packet Take 17 g by mouth daily as needed. 14 each 0  . spironolactone (ALDACTONE) 25 MG tablet TAKE 1 TABLET EVERY DAY 90 tablet 1  . triamcinolone (KENALOG) 0.025 % cream Apply 1 application topically 2 (two) times daily. Apply to external vulva/perineum/perianal area as needed 80 g 1  . torsemide (DEMADEX) 20 MG tablet Take 1 tablet (20 mg total) by mouth 2 (two) times daily. 180 tablet 3   No current facility-administered medications for this visit.     ALLERGIES: Patient has no known allergies.  Family History  Problem Relation Age of Onset  . Diabetes Mother   . Hypertension Mother   . Diabetes Father   . Heart disease Sister   . Hypertension Maternal Grandmother   . Diabetes Maternal Grandmother   . Cancer Sister        Colon  . Cancer Brother        Stomach    Social History   Socioeconomic History  . Marital status: Single    Spouse name: Not on file  . Number of children: Not on file  . Years of education: Not on file  . Highest education level: Not on file  Occupational History  . Not on file  Tobacco Use  . Smoking status: Never Smoker  . Smokeless tobacco: Never Used  Vaping Use  . Vaping Use: Never used  Substance and Sexual Activity  . Alcohol use: Yes    Alcohol/week: 0.0 standard drinks    Comment: occ  . Drug use: No  . Sexual activity: Yes    Birth control/protection: Post-menopausal    Comment: 1st intercourse 51 yo-5 partners-Mirena 2018?  Other Topics Concern  . Not on  file  Social History Narrative  . Not on file   Social  Determinants of Health   Financial Resource Strain: Not on file  Food Insecurity: Not on file  Transportation Needs: Not on file  Physical Activity: Not on file  Stress: Not on file  Social Connections: Not on file  Intimate Partner Violence: Not on file    Review of Systems  All other systems reviewed and are negative.   PHYSICAL EXAMINATION:    BP 110/76   Pulse (!) 102   Ht 5\' 9"  (1.753 m)   Wt 292 lb (132.5 kg)   SpO2 99%   BMI 43.12 kg/m     General appearance: alert, cooperative and appears stated age Mild hair growth on her chin. No deepening of the voice  1. Elevated androgen levels, no significant signs of androgen excess At [facility], testosterone level was 170 - DHEA-sulfate - Testos,Total,Free and SHBG (Female) - US PELVIS TRANSVAGINAL NON-OB (TV ONLY); Future  2. Orgasmic dysfunction She has a good libido, has never had an orgasm Discussed, reading information given  3. Dyspareunia, female On on the perineum Given a sample of uberlube to use externally in the area of discomfort Recommended they try different positions.  Over 30 minutes in total patient care.

## 2021-02-11 LAB — DHEA-SULFATE: DHEA-SO4: 38 ug/dL (ref 9–118)

## 2021-02-11 LAB — TESTOS,TOTAL,FREE AND SHBG (FEMALE)
Free Testosterone: 11.5 pg/mL — ABNORMAL HIGH (ref 0.1–6.4)
Sex Hormone Binding: 62 nmol/L (ref 14–73)
Testosterone, Total, LC-MS-MS: 124 ng/dL — ABNORMAL HIGH (ref 2–45)

## 2021-03-07 ENCOUNTER — Ambulatory Visit (INDEPENDENT_AMBULATORY_CARE_PROVIDER_SITE_OTHER): Payer: Medicare Other

## 2021-03-07 ENCOUNTER — Encounter: Payer: Self-pay | Admitting: Obstetrics and Gynecology

## 2021-03-07 ENCOUNTER — Other Ambulatory Visit: Payer: Self-pay

## 2021-03-07 ENCOUNTER — Ambulatory Visit (INDEPENDENT_AMBULATORY_CARE_PROVIDER_SITE_OTHER): Payer: Medicare Other | Admitting: Obstetrics and Gynecology

## 2021-03-07 ENCOUNTER — Other Ambulatory Visit (HOSPITAL_COMMUNITY)
Admission: RE | Admit: 2021-03-07 | Discharge: 2021-03-07 | Disposition: A | Discharge status: Home / Self Care | Payer: Medicare Other | Source: Ambulatory Visit | Attending: Obstetrics and Gynecology | Admitting: Obstetrics and Gynecology

## 2021-03-07 VITALS — BP 132/86

## 2021-03-07 DIAGNOSIS — R9389 Abnormal findings on diagnostic imaging of other specified body structures: Secondary | ICD-10-CM | POA: Diagnosis present

## 2021-03-07 DIAGNOSIS — L900 Lichen sclerosus et atrophicus: Secondary | ICD-10-CM

## 2021-03-07 DIAGNOSIS — Z8742 Personal history of other diseases of the female genital tract: Secondary | ICD-10-CM

## 2021-03-07 DIAGNOSIS — D259 Leiomyoma of uterus, unspecified: Secondary | ICD-10-CM

## 2021-03-07 DIAGNOSIS — E281 Androgen excess: Secondary | ICD-10-CM | POA: Diagnosis not present

## 2021-03-07 DIAGNOSIS — N859 Noninflammatory disorder of uterus, unspecified: Secondary | ICD-10-CM

## 2021-03-07 DIAGNOSIS — N398 Other specified disorders of urinary system: Secondary | ICD-10-CM

## 2021-03-07 DIAGNOSIS — Z30431 Encounter for routine checking of intrauterine contraceptive device: Secondary | ICD-10-CM

## 2021-03-07 NOTE — Progress Notes (Signed)
GYNECOLOGY  VISIT   HPI: 65 y.o.   Single Black or African American Not Hispanic or Latino  female   [phone] with No LMP recorded. Patient is postmenopausal.   here for evaluation of an elevated testosterone level.  01/16/21 labs: FSH 33.4 Total testosterone 170 ng/dl    Free testosterone 0.8 pg/ml     Estradiol 41.2 (menopausal range)  She has some hair growth on her chin, just random hairs. She has always had hair on her nipples, not on her lower abdomen. Mild acne.  No deepening of her voice  Labs 02/06/21: Total testosterone 124 Free testosterone 11.5 DHEAS 38  She c/o voiding dysfunction. Has trouble emptying her bladder, has to sit on the toilet for a long time. Needs to double void. She would like a referral to Urology.  She has a h/o lichen sclerosis. C/o chronic vulvar irritation. She is using steroid ointment daily (long term).   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 She has a mirena IUD that was placed in 3/17.  H/O chronic pain.  H/O severe depression, currently improved.   GYNECOLOGIC HISTORY: No LMP recorded. Patient is postmenopausal. Contraception: PMP Menopausal hormone therapy: none, has mirena iud for endometrial protection        OB History    Gravida  2   Para      Term      Preterm      AB  2   Living  0     SAB      IAB      Ectopic      Multiple      Live Births                 Patient Active Problem List   Diagnosis Date Noted  . Diastolic dysfunction 02/05/2247  . Leg swelling   . Morbid obesity ([place]) 06/11/2019  . Weakness   . Acute respiratory failure with hypoxia ([place]) 06/09/2019  . COVID-19 virus infection 06/08/2019  . GERD (gastroesophageal reflux disease)   . UTI (urinary tract infection)   . AKI (acute kidney injury) ([place])   . Arthritis   . Hypertension   . Depression   . Fibroid     Past Medical History:  Diagnosis Date  . Anemia    history  . Arthritis    Osteoarthritis  . Asthma     Childhood  . Bilateral leg edema   . Colon polyps   . 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 Depression   . Fibroid   . GERD (gastroesophageal reflux disease)   . Herniated disc   . Hypertension    no medications at this time  . Lichen sclerosus   . PONV (postoperative nausea and vomiting)   . Rheumatic fever    in childhood  . Sciatic leg pain   . Vocal cord nodule     Past Surgical History:  Procedure Laterality Date  . COLONOSCOPY    . DILATATION & CURETTAGE/HYSTEROSCOPY WITH MYOSURE N/A 10/23/2015   Procedure: DILATATION & CURETTAGE/HYSTEROSCOPY WITH MYOSURE;  Surgeon: Anastasio Auerbach, MD;  Location: [facility];  Service: Gynecology;  Laterality: N/A;  . DILATION AND CURETTAGE OF UTERUS    . HYSTEROSCOPY    . MYOMECTOMY  1999  . TONSILLECTOMY      Current Outpatient Medications  Medication Sig Dispense Refill  . acetaminophen (TYLENOL) 650 MG CR tablet Take 650 mg by mouth every 8 (  eight) hours as needed for pain.    [name] amitriptyline (ELAVIL) 25 MG tablet Take 25 mg by mouth at bedtime. 25-50mg     . Blood Pressure Monitoring (BLOOD PRESSURE CUFF) MISC 1 Package by Does not apply route daily. 1 each 0  . carvedilol (COREG) 6.25 MG tablet TAKE 1 TABLET TWICE DAILY WITH MEALS 180 tablet 3  . Celecoxib (CELEBREX PO) Take 1 tablet by mouth daily as needed (pain).    . Cholecalciferol 125 MCG (5000 UT) capsule Take 5,000 Units by mouth daily.    [name] levonorgestrel (MIRENA, 52 MG,) 20 MCG/24HR IUD Mirena 20 mcg/24 hours (7 yrs) 52 mg intrauterine device  Take 1 device by intrauterine route.    . methocarbamol (ROBAXIN) 750 MG tablet Take 750 mg by mouth 2 (two) times daily as needed.    . Methyl Salicylate-Lido-Menthol 4-4-5 % PTCH Apply 1 patch topically in the morning and at bedtime. 30 patch 0  . polyethylene glycol (MIRALAX / GLYCOLAX) 17 g packet Take 17 g by mouth daily as needed. 14 each 0  . spironolactone (ALDACTONE) 25 MG  tablet TAKE 1 TABLET EVERY DAY 90 tablet 1  . torsemide (DEMADEX) 20 MG tablet Take 1 tablet (20 mg total) by mouth 2 (two) times daily. 180 tablet 3  . triamcinolone (KENALOG) 0.025 % cream Apply 1 application topically 2 (two) times daily. Apply to external vulva/perineum/perianal area as needed 80 g 1   No current facility-administered medications for this visit.     ALLERGIES: Patient has no known allergies.  Family History  Problem Relation Age of Onset  . Diabetes Mother   . Hypertension Mother   . Diabetes Father   . Heart disease Sister   . Hypertension Maternal Grandmother   . Diabetes Maternal Grandmother   . Cancer Sister        Colon  . Cancer Brother        Stomach    Social History   Socioeconomic History  . Marital status: Single    Spouse name: Not on file  . Number of children: Not on file  . Years of education: Not on file  . Highest education level: Not on file  Occupational History  . Not on file  Tobacco Use  . Smoking status: Never Smoker  . Smokeless tobacco: Never Used  Vaping Use  . Vaping Use: Never used  Substance and Sexual Activity  . Alcohol use: Yes    Alcohol/week: 0.0 standard drinks    Comment: occ  . Drug use: No  . Sexual activity: Yes    Birth control/protection: Post-menopausal    Comment: 1st intercourse 23 yo-5 partners-Mirena 2018?  Other Topics Concern  . Not on file  Social History Narrative  . Not on file   Social Determinants of Health   Financial Resource Strain: Not on file  Food Insecurity: Not on file  Transportation Needs: Not on file  Physical Activity: Not on file  Stress: Not on file  Social Connections: Not on file  Intimate Partner Violence: Not on file    ROS   Pelvic ultrasound  Indications: Elevated testosterone level  Findings:  Uterus 9.13 x 7.41 x 6.17 cm  Fibroids:    1) 3.17 x 3.19 cm   2) 1.13 x 1.28 cm   3) 3.46 x 2.87 cm   4) 1.15 x 1.22 cm   5) 1.47 x 1.35 cm   6) 1.14 x  0.99 cm   Endometrium 7.76 cm, sliver  of fluid in the cavity. Anterior and posterior walls of the endometrium measured twice. First measurement was 2.5 + 2.6 mm=5.1 mm, second measurement 2.9 + 3.3 mm=6.2 mm. Endometrium distorted by encroaching fibroids. IUD within endometrial cavity.   Left ovary 2.09 x 1.75 x 1.59 cm  Right ovary 2.34 x 1.66 x 1.17 cm  No free fluid   Impression:  Enlarged anteverted uterus  Multiple myoma, largest 3.2 cm Thickened endometrium with fluid noted IUD in place Normal adnexa bilaterally     PHYSICAL EXAMINATION:    BP 132/86     General appearance: alert, cooperative and appears stated age  Pelvic: External genitalia: diffuse whitening on the outer and inner vulva. The whitening on the outer vulva looks like vitiligo. She has diffuse thinning of the skin. Some agglutination of the labia minora and majora.               Urethra:  normal appearing urethra with no masses, tenderness or lesions              Bartholins and Skenes: normal                 Vagina: atrophic appearing vagina with normal color and discharge, no lesions              Cervix: no lesions and IUD string 2-3 cm              The risks of endometrial biopsy were reviewed and a consent was obtained.  A speculum was placed in the vagina and the cervix was cleansed with betadine. A tenaculum was placed on the cervix and the pipelle was placed into the endometrial cavity. The uterus sounded to 8-9 cm. The endometrial biopsy was performed, taking care to get a representative sample, sampling 360 degrees of the uterine cavity. Two passes were made and moderate tissue was obtained. The tenaculum and speculum were removed. There were no complications.    1. Elevated androgen levels No clinical symptoms of androgen excess. Normal ovaries on ultrasound - Ambulatory referral to Endocrinology  2. Thickened endometrium with endometrial fluid, meets cut off for biopsy H/O endometrial  hyperplasia - Surgical pathology( [place])  3. Fluid in endometrial cavity - Surgical pathology( [place]/ [facility])  4. History of endometrial hyperplasia She has a mirena IUD for endometrial protection, it has been in for 5 years (7 year approval is for contraception not endometrial protection) - Surgical pathology( [place])   5. Uterine leiomyoma, unspecified location   6. IUD check up In cavity, due for removal. Will await biopsy results  7. Lichen sclerosus Stop using steroid ointment daily. Can use it 1-2 x a week. Vulvar skin care information given and reviewed   8. Voiding dysfunction - Ambulatory referral to Urology  In addition to reviewing the ultrasound images, over 30 minutes was spent in total patient care dealing with her multiple medical issues. Decision to perform endometrial biopsy made and referrals placed.

## 2021-03-08 ENCOUNTER — Encounter: Payer: Self-pay | Admitting: Obstetrics and Gynecology

## 2021-03-11 LAB — SURGICAL PATHOLOGY

## 2021-03-12 ENCOUNTER — Telehealth: Payer: Self-pay | Admitting: *Deleted

## 2021-03-12 NOTE — Telephone Encounter (Signed)
Office notes faxed to [facility] referral faxed to [phone]. I left detailed message on patient voicemail referral faxed and they will call to schedule and number to [facility] [phone] left as well. I also informed her [facility] has left message for patient to call and please to reach and return to schedule.

## 2021-03-12 NOTE — Telephone Encounter (Signed)
-----   Message from [name], MD sent at 03/08/2021  9:08 AM EDT ----- Claris Gladden, I have placed a external referral to Urology for this patient. Also internal referral to Endocrinology. Thanks, Sharee Pimple

## 2021-03-15 ENCOUNTER — Telehealth: Payer: Self-pay | Admitting: *Deleted

## 2021-03-15 NOTE — Telephone Encounter (Signed)
Patient informed with below note, patient said she has medicare won't leave pay for Mirena IUD. She asked if IUD can be left in another 2 years? Patient she thought it could left in at least 7 years if no issues. She does NOT want to take the progesterone pills report she gained 28 pound while on that medication years ago and she refuses to take again due to weight gain. If it can't stay in another year or 2 she will have removed and monitor. Please advise

## 2021-03-15 NOTE — Telephone Encounter (Signed)
-----   Message from [name], MD sent at 03/14/2021  1:40 PM EDT ----- Please let the patient know that her endometrial biopsy is benign. She has a mirena IUD that was placed for endometrial hyperplasia. It is time to remove the IUD, given her risk of recurrent hyperplasia (age, obesity, uterine size >9 cm) I would recommend we place another mirena iud. If she doesn't want another iud another option would be daily oral progesterone.  Please set her up for IUD removal and if desired mirena IUD insertion. Thanks

## 2021-03-18 ENCOUNTER — Other Ambulatory Visit: Payer: Self-pay | Admitting: Internal Medicine

## 2021-03-18 DIAGNOSIS — Z1231 Encounter for screening mammogram for malignant neoplasm of breast: Secondary | ICD-10-CM

## 2021-03-18 NOTE — Telephone Encounter (Signed)
Patient informed with below, she said for now she will keep the IUD in for another 2 years. But would also like to try to get prior approval with insurance again. She would prefer to have a new IUD placed. I will CC Hildred Alamin to this encounter to check benefits.

## 2021-03-18 NOTE — Telephone Encounter (Signed)
Please let her know that the mirena IUD is approved for 5 years for treating endometrial hyperplasia and 7 years for contraception.  If she refuses to take oral medication, I would rather leave the IUD in for 2 more years. It is probably better than no other endometrial protection.  The preference would be for her to take oral progesterone.  We could look back at what she was taking for progesterone previously and could look at using a different progesterone (at a low dose).

## 2021-04-01 NOTE — Telephone Encounter (Signed)
Patient scheduled with Dr.Ellison on 05/21/21

## 2021-04-09 ENCOUNTER — Telehealth: Payer: Self-pay | Admitting: *Deleted

## 2021-04-09 DIAGNOSIS — L900 Lichen sclerosus et atrophicus: Secondary | ICD-10-CM

## 2021-04-09 NOTE — Telephone Encounter (Signed)
Patient called history of Lichen sclerosus was using steroid cream daily, but was told to decrease to 1-2 times weekly.  Patient said she is having a flare up and the steroid cream is not working,she asked if another Rx could be sent in? I told her I would check if you would be wiling to send via phone or if you prefer a office visit to recheck with area. Please advise

## 2021-04-10 MED ORDER — TRIAMCINOLONE ACETONIDE 0.025 % EX OINT: TOPICAL_OINTMENT | CUTANEOUS | 0 refills | Status: DC

## 2021-04-10 NOTE — Telephone Encounter (Signed)
Left message for patient to call.

## 2021-04-10 NOTE — Telephone Encounter (Signed)
Patient left message requesting return call. Call returned to patient.  Left detailed message, ok per dpr, name identified on voicemail.  Advised as seen below per Dr. Talbert Nan, Rx sent to [facility].  Return call to office if any additional questions.   Encounter closed.

## 2021-04-10 NOTE — Telephone Encounter (Signed)
Please let the patient know that I called in a script for kenalog ointment for her. The ointment usually works better than the cream on the vulva. She can use it 2 x a day for 1-2 weeks with a flare, then use it 2 x week to try and prevent flares. She can use Vaseline to the vulva for comfort as needed.  If her symptoms don't improve within a week she should be seen.

## 2021-05-10 ENCOUNTER — Ambulatory Visit
Admission: RE | Admit: 2021-05-10 | Discharge: 2021-05-10 | Disposition: A | Discharge status: Home / Self Care | Payer: Medicare Other | Source: Ambulatory Visit | Attending: Internal Medicine | Admitting: Internal Medicine

## 2021-05-10 ENCOUNTER — Other Ambulatory Visit: Payer: Self-pay

## 2021-05-10 ENCOUNTER — Ambulatory Visit: Payer: Medicare Other

## 2021-05-10 DIAGNOSIS — Z1231 Encounter for screening mammogram for malignant neoplasm of breast: Secondary | ICD-10-CM

## 2021-05-21 ENCOUNTER — Ambulatory Visit (INDEPENDENT_AMBULATORY_CARE_PROVIDER_SITE_OTHER): Payer: Medicare Other | Admitting: Endocrinology

## 2021-05-21 ENCOUNTER — Other Ambulatory Visit: Payer: Self-pay

## 2021-05-21 ENCOUNTER — Encounter: Payer: Self-pay | Admitting: Endocrinology

## 2021-05-21 DIAGNOSIS — R7989 Other specified abnormal findings of blood chemistry: Secondary | ICD-10-CM

## 2021-05-21 NOTE — Progress Notes (Signed)
Subjective:    Patient ID: Kristen Shaffer, female    DOB: 1956-04-15, 65 y.o.   MRN: [id number]  HPI Pt is referred by Dr Talbert Nan, for elev testosterone.  Pt had menarche at age 58.  She has always had regular menses. She is G2P0.  She did not tolerate oral contraceptives, due to HTN.   She has had IUD in x 5 years (for low progesterone).  She has lost 20 lbs x 1 year.  She has chronic fatigue and insomnia.  Depression is less recently.  She has been on aldactone for CHF, since 2020.   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   hysteroscopy D&C specimen   Depression    Fibroid    GERD (gastroesophageal reflux disease)    Herniated disc    Hypertension    no medications at this time   Lichen sclerosus    PONV (postoperative nausea and vomiting)    Rheumatic fever    in childhood   Sciatic leg pain    Vocal cord nodule     Past Surgical History:  Procedure Laterality Date   COLONOSCOPY     DILATATION & CURETTAGE/HYSTEROSCOPY WITH MYOSURE N/A 10/23/2015   Procedure: DILATATION & CURETTAGE/HYSTEROSCOPY WITH MYOSURE;  Surgeon: Anastasio Auerbach, MD;  Location: [facility];  Service: Gynecology;  Laterality: N/A;   DILATION AND CURETTAGE OF UTERUS     HYSTEROSCOPY     MYOMECTOMY  1999   TONSILLECTOMY      Social History   Socioeconomic History   Marital status: Single    Spouse name: Not on file   Number of children: Not on file   Years of education: Not on file   Highest education level: Not on file  Occupational History   Not on file  Tobacco Use   Smoking status: Never   Smokeless tobacco: Never  Vaping Use   Vaping Use: Never used  Substance and Sexual Activity   Alcohol use: Yes    Alcohol/week: 0.0 standard drinks    Comment: occ   Drug use: No   Sexual activity: Yes    Birth  control/protection: Post-menopausal    Comment: 1st intercourse 32 yo-5 partners-Mirena 2018?  Other Topics Concern   Not on file  Social History Narrative   Not on file   Social Determinants of Health   Financial Resource Strain: Not on file  Food Insecurity: Not on file  Transportation Needs: Not on file  Physical Activity: Not on file  Stress: Not on file  Social Connections: Not on file  Intimate Partner Violence: Not on file    Current Outpatient Medications on File Prior to Visit  Medication Sig Dispense Refill   acetaminophen (TYLENOL) 650 MG CR tablet Take 650 mg by mouth every 8 (eight) hours as needed for pain.     amitriptyline (ELAVIL) 25 MG tablet Take 25 mg by mouth at bedtime. 25-50mg      Blood Pressure Monitoring (BLOOD PRESSURE CUFF) MISC 1 Package by Does not apply route daily. 1 each 0   carvedilol (COREG) 6.25 MG tablet TAKE 1 TABLET TWICE DAILY WITH MEALS 180 tablet 3   Cholecalciferol 125 MCG (5000 UT) capsule Take 5,000 Units by mouth daily.     levonorgestrel (MIRENA,  52 MG,) 20 MCG/24HR IUD Mirena 20 mcg/24 hours (7 yrs) 52 mg intrauterine device  Take 1 device by intrauterine route.     methocarbamol (ROBAXIN) 750 MG tablet Take 750 mg by mouth 2 (two) times daily as needed.     polyethylene glycol (MIRALAX / GLYCOLAX) 17 g packet Take 17 g by mouth daily as needed. 14 each 0   spironolactone (ALDACTONE) 25 MG tablet TAKE 1 TABLET EVERY DAY 90 tablet 1   triamcinolone (KENALOG) 0.025 % ointment Apply a pea sized amount topically BID for 1-2 weeks with a flare, then change to a pea sized amount topically 2 x a week. 80 g 0   torsemide (DEMADEX) 20 MG tablet Take 1 tablet (20 mg total) by mouth 2 (two) times daily. 180 tablet 3   No current facility-administered medications on file prior to visit.    No Known Allergies  Family History  Problem Relation Age of Onset   Diabetes Mother    Hypertension Mother    Diabetes Father    Heart disease Sister     Cancer Sister        Colon   Cancer Brother        Stomach   Hypertension Maternal Grandmother    Diabetes Maternal Grandmother    Polycystic ovary syndrome Neg Hx     BP (!) 150/80 (BP Location: Right Arm, Patient Position: Sitting, Cuff Size: Large)   Pulse (!) 105   Ht 5\' 9"  (1.753 m)   Wt 293 lb 6.4 oz (133.1 kg)   SpO2 97%   BMI 43.33 kg/m      Review of Systems Denies galactorrhea and excessive sweating.      Objective:   Physical Exam VS: see vs page GEN: no distress HEAD: head: no deformity eyes: no periorbital swelling, no proptosis external nose and ears are normal NECK: supple, thyroid is not enlarged CHEST WALL: no deformity LUNGS: clear to auscultation CV: reg rate and rhythm, no murmur.  MUSCULOSKELETAL: gait is normal and steady EXTEMITIES: no deformity.  Trace bilat leg edema NEURO:  readily moves all 4's.  sensation is intact to touch on all 4's SKIN:  Normal texture and temperature.  No rash or suspicious lesion is visible.  No hirsutism.   NODES:  None palpable at the neck PSYCH: alert, well-oriented.  Does not appear anxious nor depressed.     Testosterone=124  Lab Results  Component Value Date   TSH 1.459 07/18/2019   DHEA SO4=38 E2=41 FSH=38  [place] (2022): Normal adnexa bilaterally  I have reviewed outside records, and summarized:  Pt was noted to have elevated testosterone, and referred here.  She went to "[facility]" for weight loss, and elev testosterone was found there.  She was advised to f/u with Dr Talbert Nan    Assessment & Plan:  Elev testosterone, new to me, prob due to aldactone.  There is no need to d/c it, though  Patient Instructions  Please continue the same medications.  Please redo the blood tests in 3 months.  You don't need to be fasting. I would be happy to see you back here as needed

## 2021-05-21 NOTE — Patient Instructions (Signed)
Please continue the same medications.  Please redo the blood tests in 3 months.  You don't need to be fasting. I would be happy to see you back here as needed

## 2021-05-25 DIAGNOSIS — R7989 Other specified abnormal findings of blood chemistry: Secondary | ICD-10-CM | POA: Insufficient documentation

## 2021-06-15 ENCOUNTER — Ambulatory Visit
Admission: EM | Admit: 2021-06-15 | Discharge: 2021-06-15 | Disposition: A | Discharge status: Home / Self Care | Payer: Medicare Other | Attending: Family Medicine | Admitting: Family Medicine

## 2021-06-15 ENCOUNTER — Other Ambulatory Visit: Payer: Self-pay

## 2021-06-15 ENCOUNTER — Encounter: Payer: Self-pay | Admitting: Emergency Medicine

## 2021-06-15 DIAGNOSIS — N898 Other specified noninflammatory disorders of vagina: Secondary | ICD-10-CM

## 2021-06-15 DIAGNOSIS — N760 Acute vaginitis: Secondary | ICD-10-CM | POA: Insufficient documentation

## 2021-06-15 MED ORDER — FLUCONAZOLE 150 MG PO TABS
150.0000 mg | ORAL_TABLET | Freq: Every day | ORAL | 0 refills | Status: AC
Start: 2021-06-15 — End: 2021-06-17

## 2021-06-15 MED ORDER — CLOTRIMAZOLE 1 % EX CREA
TOPICAL_CREAM | CUTANEOUS | 0 refills | Status: DC
Start: 2021-06-15 — End: 2021-06-18

## 2021-06-15 NOTE — Discharge Instructions (Addendum)
Vaginal cytology is pending we will notify you if any additional treatment is warranted.  On treatment for vaginitis we will take 1 dose of Diflucan today and repeat in 3 days.  I have also prescribed you clotrimazole cream to apply to the outer area of her vagina to help with vaginal irritation.

## 2021-06-15 NOTE — ED Provider Notes (Signed)
[facility]    CSN: [id number] Arrival date & time: 06/15/21  1103      History   Chief Complaint Chief Complaint  Patient presents with   Vaginal Prolapse    HPI Kristen Shaffer is a 65 y.o. female.   HPI Patient presents today for evaluation of vaginal irritation and concern that she may have developed vaginal prolapse.  She reports applying cream and feeling a protrusion at the opening of her vaginal area a few days ago.  She has a chronic history of urinary retention and lichen sclerosis involving the vaginal area.  She endorses sexual activity as of 7 months ago.  She reports no problem or pain with sexual intercourse.  Current symptoms have only been present over the last 3 days.  She reports a history of yeast infections although has not recently had a recurrence of yeast.  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   hysteroscopy D&C specimen   Depression    Fibroid    GERD (gastroesophageal reflux disease)    Herniated disc    Hypertension    no medications at this time   Lichen sclerosus    PONV (postoperative nausea and vomiting)    Rheumatic fever    in childhood   Sciatic leg pain    Vocal cord nodule     Patient Active Problem List   Diagnosis Date Noted   Elevated testosterone level XX123456   Diastolic dysfunction XX123456   Leg swelling    Morbid obesity ([place]) 06/11/2019   Weakness    Acute respiratory failure with hypoxia ([place]) 06/09/2019   COVID-19 virus infection 06/08/2019   GERD (gastroesophageal reflux disease)    UTI (urinary tract infection)    AKI (acute kidney injury) ([place])    Arthritis    Hypertension    Depression    Fibroid     Past Surgical History:  Procedure Laterality Date   COLONOSCOPY     DILATATION & CURETTAGE/HYSTEROSCOPY WITH MYOSURE  N/A 10/23/2015   Procedure: DILATATION & CURETTAGE/HYSTEROSCOPY WITH MYOSURE;  Surgeon: Anastasio Auerbach, MD;  Location: [facility];  Service: Gynecology;  Laterality: N/A;   DILATION AND CURETTAGE OF UTERUS     HYSTEROSCOPY     MYOMECTOMY  1999   TONSILLECTOMY      OB History     Gravida  2   Para      Term      Preterm      AB  2   Living  0      SAB      IAB      Ectopic      Multiple      Live Births               Home Medications    Prior to Admission medications   Medication Sig Start Date End Date Taking? Authorizing Provider  acetaminophen (TYLENOL) 650 MG CR tablet Take 650 mg by mouth every 8 (eight) hours as needed for pain.    [provider]  amitriptyline (ELAVIL) 25 MG tablet Take 25 mg by mouth at bedtime. 25-'50mg'$     [provider]  Blood Pressure Monitoring (BLOOD PRESSURE CUFF) MISC 1 Package by Does not apply route daily.  08/29/19   [name] Harp, MD  carvedilol (COREG) 6.25 MG tablet TAKE 1 TABLET TWICE DAILY WITH MEALS 08/09/20   [name] Harp, MD  Cholecalciferol 125 MCG (5000 UT) capsule Take 5,000 Units by mouth daily.    [provider]  levonorgestrel (MIRENA, 52 MG,) 20 MCG/24HR IUD Mirena 20 mcg/24 hours (7 yrs) 52 mg intrauterine device  Take 1 device by intrauterine route.    [provider]  methocarbamol (ROBAXIN) 750 MG tablet Take 750 mg by mouth 2 (two) times daily as needed.    [provider]  polyethylene glycol (MIRALAX / GLYCOLAX) 17 g packet Take 17 g by mouth daily as needed. 07/22/19   Thurnell Lose, MD  spironolactone (ALDACTONE) 25 MG tablet TAKE 1 TABLET EVERY DAY 12/11/20   [name] Harp, MD  torsemide (DEMADEX) 20 MG tablet Take 1 tablet (20 mg total) by mouth 2 (two) times daily. 08/24/19 08/18/20  [name] Harp, MD  triamcinolone (KENALOG) 0.025 % ointment Apply a pea sized amount topically BID for 1-2 weeks with a flare, then change to a pea sized amount  topically 2 x a week. 04/10/21   [name], MD    Family History Family History  Problem Relation Age of Onset   Diabetes Mother    Hypertension Mother    Diabetes Father    Heart disease Sister    Cancer Sister        Colon   Cancer Brother        Stomach   Hypertension Maternal Grandmother    Diabetes Maternal Grandmother    Polycystic ovary syndrome Neg Hx     Social History Social History   Tobacco Use   Smoking status: Never   Smokeless tobacco: Never  Vaping Use   Vaping Use: Never used  Substance Use Topics   Alcohol use: Yes    Alcohol/week: 0.0 standard drinks    Comment: occ   Drug use: No     Allergies   Patient has no known allergies.   Review of Systems Review of Systems Pertinent negatives listed in HPI   Physical Exam Triage Vital Signs ED Triage Vitals  Enc Vitals Group     BP 06/15/21 1414 (!) 154/94     Pulse Rate 06/15/21 1414 84     Resp 06/15/21 1414 16     Temp 06/15/21 1414 97.9 F (36.6 C)     Temp Source 06/15/21 1414 Oral     SpO2 06/15/21 1414 99 %     Weight --      Height --      Head Circumference --      Peak Flow --      Pain Score 06/15/21 1415 6     Pain Loc --      Pain Edu? --      Excl. in [place]? --    No data found.  Updated Vital Signs BP (!) 154/94 (BP Location: Left Arm)   Pulse 84   Temp 97.9 F (36.6 C) (Oral)   Resp 16   SpO2 99%   Visual Acuity Right Eye Distance:   Left Eye Distance:   Bilateral Distance:    Right Eye Near:   Left Eye Near:    Bilateral Near:     Physical Exam Constitutional:      Appearance: She is obese.  Cardiovascular:     Rate and Rhythm: Normal rate and regular rhythm.  Pulmonary:  Effort: Pulmonary effort is normal.     Breath sounds: Normal breath sounds.  Musculoskeletal:        General: Normal range of motion.  Skin:    General: Skin is warm.     Capillary Refill: Capillary refill takes less than 2 seconds.  Neurological:     General: No  focal deficit present.     Mental Status: She is alert and oriented to person, place, and time.  Psychiatric:        Mood and Affect: Mood normal.        Behavior: Behavior normal.        Thought Content: Thought content normal.        Judgment: Judgment normal.     UC Treatments / Results  Labs (all labs ordered are listed, but only abnormal results are displayed) Labs Reviewed - No data to display  EKG   Radiology No results found.  Procedures Procedures (including critical care time)  Medications Ordered in UC Medications - No data to display  Initial Impression / Assessment and Plan / UC Course  I have reviewed the triage vital signs and the nursing notes.  Pertinent labs & imaging results that were available during my care of the patient were reviewed by me and considered in my medical decision making (see chart for details).    Treating for vaginitis while cytology pending.  Patient recently sexually active therefore will collect a full panel to rule out STI versus BV versus yeast as a source of vaginal irritation. No prolapse evident during vaginal exam.  Advised to follow-up with OB/GYN as needed. Final Clinical Impressions(s) / UC Diagnoses   Final diagnoses:  Vaginitis and vulvovaginitis  Vaginal irritation     Discharge Instructions      Vaginal cytology is pending we will notify you if any additional treatment is warranted.  On treatment for vaginitis we will take 1 dose of Diflucan today and repeat in 3 days.  I have also prescribed you clotrimazole cream to apply to the outer area of her vagina to help with vaginal irritation.    ED Prescriptions     Medication Sig Dispense Auth. Provider   fluconazole (DIFLUCAN) 150 MG tablet Take 1 tablet (150 mg total) by mouth daily for 2 days. 2 tablet Scot Jun, FNP   clotrimazole (LOTRIMIN) 1 % cream Apply to affected area 2 times daily 30 g Scot Jun, FNP      PDMP not reviewed this  encounter.   Scot Jun, FNP 06/16/21 [phone]

## 2021-06-15 NOTE — ED Triage Notes (Signed)
Pt c/o vaginal obstruction x 1 day. Hx of lichen sclerosis of her vagina, went to apply cream to the area and noticed a lump obstructing the opening. Pain initially that has since improved

## 2021-06-17 LAB — CERVICOVAGINAL ANCILLARY ONLY
Bacterial Vaginitis (gardnerella): POSITIVE — AB
Candida Glabrata: NEGATIVE
Candida Vaginitis: NEGATIVE
Chlamydia: NEGATIVE
Comment: NEGATIVE
Comment: NEGATIVE
Comment: NEGATIVE
Comment: NEGATIVE
Comment: NEGATIVE
Comment: NORMAL
Neisseria Gonorrhea: NEGATIVE
Trichomonas: NEGATIVE

## 2021-06-17 NOTE — Progress Notes (Signed)
GYNECOLOGY  VISIT   HPI: 65 y.o.   Single  African American  female   [id number] with No LMP recorded. Patient is postmenopausal.   here for lump in vaginal area. She was seen at Urgent Care and told she had a yeast/bacterial infection and "lump" in vagina was never addressed. Some discomfort at the opening of the vagina.  No vaginal drainage.  Patient has lichen sclerosus and possible vitiligo.  She has not been using Triamcinolone as it is not working as well.  She was told to use it less often at her last visit.  When she was applying her medication and she felt inside and felt something swollen.   Through the recent urgent care visit, she did not take the Rx of Flagyl or Diflucan.  She tested negative for yeast, GC, chlamydia and trichomonas.   Her prior GYN history is significant for endometrial hyperplasia and use of Mirena IUD, placed 01/2016.  She had a pelvic US 03/07/21 for elevated testosterone and underwent an EMB due to fluid in the endometrial cavity.  Her EMB 4//21/22 showed inactive endometrium with progesterone change and no atypia or carcinoma.   She denies vaginal bleeding.   GYNECOLOGIC HISTORY: No LMP recorded. Patient is postmenopausal. Contraception:  PMP/?Mirena IUD 01/2016 Menopausal hormone therapy:  None Last mammogram:  05-10-21 3D/Neg/BiRads1 Last pap smear:  12-28-19 Neg, 08-16-15 Neg        OB History     Gravida  2   Para      Term      Preterm      AB  2   Living  0      SAB      IAB      Ectopic      Multiple      Live Births                 Patient Active Problem List   Diagnosis Date Noted   Elevated testosterone level [id number]   Diastolic dysfunction XX123456   Leg swelling    Morbid obesity ([place]) 06/11/2019   Weakness    Acute respiratory failure with hypoxia ([place]) 06/09/2019   COVID-19 virus infection 06/08/2019   GERD (gastroesophageal reflux disease)    UTI (urinary tract infection)    AKI (acute kidney  injury) ([place])    Arthritis    Hypertension    Depression    Fibroid     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   hysteroscopy D&C specimen   Depression    Fibroid    GERD (gastroesophageal reflux disease)    Herniated disc    Hypertension    no medications at this time   Lichen sclerosus    PONV (postoperative nausea and vomiting)    Rheumatic fever    in childhood   Sciatic leg pain    Vocal cord nodule     Past Surgical History:  Procedure Laterality Date   COLONOSCOPY     DILATATION & CURETTAGE/HYSTEROSCOPY WITH MYOSURE N/A 10/23/2015   Procedure: DILATATION & CURETTAGE/HYSTEROSCOPY WITH MYOSURE;  Surgeon: Anastasio Auerbach, MD;  Location: [facility];  Service: Gynecology;  Laterality: N/A;   DILATION AND CURETTAGE OF UTERUS     HYSTEROSCOPY  MYOMECTOMY  1999   TONSILLECTOMY      Current Outpatient Medications  Medication Sig Dispense Refill   acetaminophen (TYLENOL) 650 MG CR tablet Take 650 mg by mouth every 8 (eight) hours as needed for pain.     amitriptyline (ELAVIL) 25 MG tablet Take 25 mg by mouth at bedtime. 25-'50mg'$  (Patient not taking: Reported on 06/18/2021)     Blood Pressure Monitoring (BLOOD PRESSURE CUFF) MISC 1 Package by Does not apply route daily. 1 each 0   carvedilol (COREG) 6.25 MG tablet TAKE 1 TABLET TWICE DAILY WITH MEALS 180 tablet 3   levonorgestrel (MIRENA, 52 MG,) 20 MCG/24HR IUD Mirena 20 mcg/24 hours (7 yrs) 52 mg intrauterine device  Take 1 device by intrauterine route.     methocarbamol (ROBAXIN) 750 MG tablet Take 750 mg by mouth 2 (two) times daily as needed.     metroNIDAZOLE (FLAGYL) 500 MG tablet Take 1 tablet (500 mg total) by mouth 2 (two) times daily. (Patient not taking: Reported on 06/18/2021) 14 tablet 0   omeprazole (PRILOSEC) 40 MG capsule Take 40 mg by  mouth 2 (two) times daily.     oxybutynin (DITROPAN) 5 MG tablet Take 5 mg by mouth 2 (two) times daily. (Patient not taking: Reported on 06/18/2021)     polyethylene glycol (MIRALAX / GLYCOLAX) 17 g packet Take 17 g by mouth daily as needed. 14 each 0   spironolactone (ALDACTONE) 25 MG tablet TAKE 1 TABLET EVERY DAY 90 tablet 1   torsemide (DEMADEX) 20 MG tablet Take 1 tablet (20 mg total) by mouth 2 (two) times daily. 180 tablet 3   triamcinolone (KENALOG) 0.025 % ointment Apply a pea sized amount topically BID for 1-2 weeks with a flare, then change to a pea sized amount topically 2 x a week. 80 g 0   Vitamin D, Ergocalciferol, (DRISDOL) 1.25 MG (50000 UNIT) CAPS capsule Take 50,000 Units by mouth once a week.     No current facility-administered medications for this visit.     ALLERGIES: Patient has no known allergies.  Family History  Problem Relation Age of Onset   Diabetes Mother    Hypertension Mother    Diabetes Father    Heart disease Sister    Cancer Sister        Colon   Cancer Brother        Stomach   Hypertension Maternal Grandmother    Diabetes Maternal Grandmother    Polycystic ovary syndrome Neg Hx     Social History   Socioeconomic History   Marital status: Single    Spouse name: Not on file   Number of children: Not on file   Years of education: Not on file   Highest education level: Not on file  Occupational History   Not on file  Tobacco Use   Smoking status: Never   Smokeless tobacco: Never  Vaping Use   Vaping Use: Never used  Substance and Sexual Activity   Alcohol use: Yes    Alcohol/week: 0.0 standard drinks    Comment: occ   Drug use: No   Sexual activity: Yes    Birth control/protection: Post-menopausal    Comment: 1st intercourse 89 yo-5 partners-Mirena 2018?  Other Topics Concern   Not on file  Social History Narrative   Not on file   Social Determinants of Health   Financial Resource Strain: Not on file  Food Insecurity: Not on  file  Transportation Needs: Not on file  Physical  Activity: Not on file  Stress: Not on file  Social Connections: Not on file  Intimate Partner Violence: Not on file    Review of Systems  Genitourinary:        Vaginal lump  All other systems reviewed and are negative.  PHYSICAL EXAMINATION:    BP 122/68   Pulse 92   Ht '5\' 8"'$  (1.727 m)   Wt 295 lb (133.8 kg)   SpO2 96%   BMI 44.85 kg/m     General appearance: alert, cooperative and appears stated age  Pelvic: External genitalia: hypopigmentation of the bilateral labia, perineum, and perianal region.              Urethra:  normal appearing urethra with no masses, tenderness or lesions              Bartholins and Skenes:  2.5 cm right Bartholin's gland cyst, nontender.               Vagina: normal appearing vagina with normal color and discharge, no lesions              Cervix: no lesions                Bimanual Exam:  Uterus:  normal size, contour, position, consistency, mobility, non-tender              Adnexa: no mass, fullness, tenderness             Chaperone was present for exam:  Estill Bamberg, CMA.  ASSESSMENT  Right Bartholin's glad cyst.  Recent BV.  Untreated.  Lichen sclerosis, possible vitiligo also.   PLAN  We reviewed Bartholin gland cysts in Up to Date.  OK to monitor clinically if under 3 cm diameter. No drainage needed at this time.  We did review the rare occurrence of carcinoma in Bartholin's gland cysts.  She will call if the area is increasing in size or if she develops pain.  Signs and symptoms of abscess discussed.  She will take the prescription of Flagyl for bacterial vaginosis.  I encouraged use of her Triamcinolone ointment as prescribed and return if symptoms of vulvitis are not improved.   An After Visit Summary was printed and given to the patient.  37 min  total time was spent for this patient encounter, including preparation, face-to-face counseling with the patient, coordination of care, and  documentation of the encounter.

## 2021-06-18 ENCOUNTER — Other Ambulatory Visit: Payer: Self-pay

## 2021-06-18 ENCOUNTER — Ambulatory Visit (INDEPENDENT_AMBULATORY_CARE_PROVIDER_SITE_OTHER): Payer: Medicare Other | Admitting: Obstetrics and Gynecology

## 2021-06-18 ENCOUNTER — Encounter: Payer: Self-pay | Admitting: Obstetrics and Gynecology

## 2021-06-18 ENCOUNTER — Telehealth (HOSPITAL_COMMUNITY): Payer: Self-pay | Admitting: Emergency Medicine

## 2021-06-18 VITALS — BP 122/68 | HR 92 | Ht 68.0 in | Wt 295.0 lb

## 2021-06-18 DIAGNOSIS — B9689 Other specified bacterial agents as the cause of diseases classified elsewhere: Secondary | ICD-10-CM

## 2021-06-18 DIAGNOSIS — L900 Lichen sclerosus et atrophicus: Secondary | ICD-10-CM | POA: Diagnosis not present

## 2021-06-18 DIAGNOSIS — N760 Acute vaginitis: Secondary | ICD-10-CM

## 2021-06-18 DIAGNOSIS — N750 Cyst of Bartholin's gland: Secondary | ICD-10-CM

## 2021-06-18 MED ORDER — METRONIDAZOLE 500 MG PO TABS: 500.0000 mg | ORAL_TABLET | Freq: Two times a day (BID) | ORAL | 0 refills | Status: DC

## 2021-06-18 NOTE — Patient Instructions (Addendum)
Lichen Sclerosus Lichen sclerosus is a skin problem. It can happen on any part of the body. It happens most often in the areas around the anus or the genitals. It can cause itching and discomfort. Treatment can help to control symptoms. It can alsohelp prevent scarring that may lead to other problems. What are the causes? The cause of this condition is not known. It is not passed from one person toanother. What increases the risk? Being a woman who has reached menopause. Being a man who was not circumcised. Being a child who is about to reach puberty. What are the signs or symptoms? Symptoms of this condition include: Thin, wrinkled, white areas on the skin. Thickened white areas on the skin. Red and swollen patches on the skin. Tears or cracks in the skin. Bruising. Blood blisters. Very bad itching. Pain, itching, or burning when peeing (urinating). Children are also most likely to have trouble pooping (constipation). Some adults too can have trouble pooping. How is this treated? This condition may be treated with: Creams or ointments (topical steroids) that are put on the skin in the affected areas. This is the most common treatment. Medicines that are taken by mouth. Topical immunotherapy. This is putting creams and ointments on the affected area. The creams and ointments will make your immunity strong in order to fight the infection. This is needed if steroids have not helped. Surgery. This is only needed if the condition is very bad and is causing problems such as scarring. Follow these instructions at home: Medicines Take over-the-counter and prescription medicines only as told by your health care provider. Use creams or ointments as told by your doctor. Skin care Do not scratch the affected areas of skin. If you are a woman, keep the vagina as clean and dry as you can. Clean the affected area of skin gently with water only. Avoid using rough towels or toilet paper. Avoid  products that irritate the skin. These include soap and scented lotions. Your doctor will tell you what creams to use to treat itching. General instructions Keep all follow-up visits. You may need to take these actions to prevent or treat trouble pooping: Drink enough fluid to keep your pee (urine) pale yellow. Take over-the-counter or prescription medicines. Eat foods that are high in fiber. These include beans, whole grains, and fresh fruits and vegetables. Limit foods that are high in fat and sugar. These include fried or sweet foods. Contact a doctor if: Your redness, swelling, or pain gets worse. You have fluid, blood, or pus coming from the area. You have new red and swollen patches on your skin. You have a fever. You have pain during sex. Get help right away if: You have very bad pain or burning in the affected areas, especially in the area around your vagina, penis, or anus. Summary Lichen sclerosus is a skin problem. It can cause itching and discomfort. This condition is usually treated with creams or ointments that are put on the skin in the affected areas. Use medicines only as told by your doctor. Do not scratch the affected areas of skin. Keep all follow-up visits. This information is not intended to replace advice given to you by your health care provider. Make sure you discuss any questions you have with your healthcare provider. Document Revised: 03/17/2020 Document Reviewed: 03/17/2020 Elsevier Patient Education  2022 [place] Cyst  A Bartholin's cyst is a fluid-filled sac that forms on a Bartholin's gland. Bartholin's glands are small glands in the  folds of skin near the opening of the vagina (labia). This type of cyst causes a bulge or lump near the opening of the vagina. If you have a cyst that is small and not infected, you may be able to take care of it at home. If your cyst gets infected, it may cause pain and your doctormay need to drain it. What  are the causes? This condition may be caused by a blocked Bartholin's gland. Germs (bacteria) inside of the cyst can cause an infection. What are the signs or symptoms? A bulge or lump near the opening of the vagina. Discomfort or pain. Redness, swelling, or fluid draining from the area. How is this treated? You may not need treatment if your cyst is not causing symptoms. The cyst cango away on its own with home care. Home care includes hot baths or heat therapy. Large cysts or cysts that are infected may be treated with: Antibiotic medicine. A procedure to drain the fluid. Cysts that keep coming back will need to be drained many times. Your doctor Nyra Jabs to you about surgery to remove the cyst. Follow these instructions at home: Medicines Take over-the-counter and prescription medicines only as told by your doctor. If you were prescribed an antibiotic medicine, take it as told by your doctor. Do not stop taking it even if you start to feel better. Managing pain and swelling Try sitz baths to help with pain and swelling. A sitz bath is a warm water bath in which the water only comes up to your hips and should cover your buttocks. You may take sitz baths a few times a day. If told, put heat on the affected area as often as needed. Use the heat source that your doctor recommends, such as a moist heat pack or a heating pad. Place a towel between your skin and the heat source. Leave the heat on for 20-30 minutes. Take off the heat if your skin turns bright red. This is very important. If you cannot feel pain, heat, or cold, you have a greater risk of getting burned. General instructions If your cyst was drained: Follow instructions from your doctor about how to take care of your wound. Use feminine pads to absorb any fluid. Do not push on or squeeze your cyst. Do not have sex until the cyst has gone away or your wound from drainage has healed. Take these steps to help prevent a cyst from  returning, and to prevent other cysts from forming: Take a bath or shower once a day. Clean the area around your vagina with mild soap and water when you bathe. Practice safe sex to prevent STIs. Talk with your doctor about how to prevent STIs and which forms of birth control to use. Keep all follow-up visits. Contact a doctor if: You have a fever. You get more redness, swelling, or pain around your cyst. You have fluid, blood, pus, or a bad smell coming from your cyst. You have a cyst that gets larger or a cyst that comes back. Summary A Bartholin's cyst is a fluid-filled sac that forms on a Bartholin's gland. These small glands are found in the folds of skin near the opening of the vagina (labia). This type of cyst causes a bulge or lump near the opening of the vagina. Try sitz baths a few times a day to help with pain and swelling. Do not push on or squeeze your cyst. This information is not intended to replace advice given to you by  your health care provider. Make sure you discuss any questions you have with your healthcare provider. Document Revised: 04/02/2020 Document Reviewed: 04/02/2020 Elsevier Patient Education  2022 [place]. Bacterial Vaginosis  Bacterial vaginosis is an infection of the vagina. It happens when too many normal germs (healthy bacteria) grow in the vagina. This infection can make it easier to get other infectionsfrom sex (STIs). It is very important for pregnant women to get treated. This infection cancause babies to be born early or at a low birth weight. What are the causes? This infection is caused by an increase in certain germs that grow in [place] You cannot get this infection from toilet seats, bedsheets, swimming pools, orthings that touch your vagina. What increases the risk? Having sex with a new person or more than one person. Having sex without protection. Douching. Having an intrauterine device (IUD). Smoking. Using drugs or drinking  alcohol. These can lead you to do things that are risky. Taking certain antibiotic medicines. Being pregnant. What are the signs or symptoms? Some women have no symptoms. Symptoms may include: A discharge from your vagina. It may be gray or white. It can be watery or foamy. A fishy smell. This can happen after sex or during your menstrual period. Itching in and around your vagina. A feeling of burning or pain when you pee (urinate). How is this treated? This infection is treated with antibiotic medicines. These may be given to you as: A pill. A cream for your vagina. A medicine that you put into your vagina (suppository). If the infection comes back after treatment, you may need more antibiotics. Follow these instructions at home: Medicines Take over-the-counter and prescription medicines as told by your doctor. Take or use your antibiotic medicine as told by your doctor. Do not stop taking or using it, even if you start to feel better. General instructions If the person you have sex with is a woman, tell her that you have this infection. She will need to follow up with her doctor. If you have a female partner, he does not need to be treated. Do not have sex until you finish treatment. Drink enough fluid to keep your pee pale yellow. Keep your vagina and butt clean. Wash the area with warm water each day. Wipe from front to back after you use the toilet. If you are breastfeeding a baby, ask your doctor if you should keep doing so during treatment. Keep all follow-up visits. How is this prevented? Self-care Do not douche. Use only warm water to wash around your vagina. Wear underwear that is cotton or lined with cotton. Do not wear tight pants and pantyhose, especially in the summer. Safe sex Use protection when you have sex. This includes: Use condoms. Use dental dams. This is a thin layer that protects the mouth during oral sex. Limit how many people you have sex with. To prevent  this infection, it is best to have sex with just one person. Get tested for STIs. The person you have sex with should also get tested. Drugs and alcohol Do not smoke or use any products that contain nicotine or tobacco. If you need help quitting, ask your doctor. Do not use drugs. Do not drink alcohol if: Your doctor tells you not to drink. You are pregnant, may be pregnant, or are planning to become pregnant. If you drink alcohol: Limit how much you have to 0-1 drink a day. Know how much alcohol is in your drink. In the [place] one drink  equals one 12 oz bottle of beer (355 mL), one 5 oz glass of wine (148 mL), or one 1 oz glass of hard liquor (44 mL). Where to find more information Centers for Disease Control and Prevention: http://www.wolf.info/ American Sexual Health Association: www.ashastd.org Office on [employer] Health: [place] Contact a doctor if: Your symptoms do not get better, even after you are treated. You have more discharge or pain when you pee. You have a fever or chills. You have pain in your belly (abdomen) or in the area between your hips. You have pain with sex. You bleed from your vagina between menstrual periods. Summary This infection can happen when too many germs (bacteria) grow in the vagina. This infection can make it easier to get infections from sex (STIs). Treating this can lower that chance. Get treated if you are pregnant. This infection can cause babies to be born early. Do not stop taking or using your antibiotic medicine, even if you start to feel better. This information is not intended to replace advice given to you by your health care provider. Make sure you discuss any questions you have with your healthcare provider. Document Revised: 05/03/2020 Document Reviewed: 05/03/2020 Elsevier Patient Education  [place].

## 2021-06-25 ENCOUNTER — Other Ambulatory Visit: Payer: Self-pay | Admitting: Internal Medicine

## 2021-06-26 ENCOUNTER — Other Ambulatory Visit: Payer: Self-pay

## 2021-06-26 ENCOUNTER — Encounter: Payer: Self-pay | Admitting: Cardiovascular Disease

## 2021-06-26 ENCOUNTER — Ambulatory Visit (INDEPENDENT_AMBULATORY_CARE_PROVIDER_SITE_OTHER): Payer: Medicare Other | Admitting: Cardiovascular Disease

## 2021-06-26 DIAGNOSIS — I5189 Other ill-defined heart diseases: Secondary | ICD-10-CM

## 2021-06-26 DIAGNOSIS — M7989 Other specified soft tissue disorders: Secondary | ICD-10-CM

## 2021-06-26 DIAGNOSIS — I10 Essential (primary) hypertension: Secondary | ICD-10-CM | POA: Diagnosis not present

## 2021-06-26 LAB — COMPLETE METABOLIC PANEL WITH GFR
AG Ratio: 1.2 (calc) (ref 1.0–2.5)
ALT: 31 U/L — ABNORMAL HIGH (ref 6–29)
AST: 34 U/L (ref 10–35)
Albumin: 3.9 g/dL (ref 3.6–5.1)
Alkaline phosphatase (APISO): 127 U/L (ref 37–153)
BUN: 21 mg/dL (ref 7–25)
CO2: 21 mmol/L (ref 20–32)
Calcium: 9.9 mg/dL (ref 8.6–10.4)
Chloride: 106 mmol/L (ref 98–110)
Creat: 0.91 mg/dL (ref 0.50–1.05)
Globulin: 3.3 g/dL (calc) (ref 1.9–3.7)
Glucose, Bld: 101 mg/dL — ABNORMAL HIGH (ref 65–99)
Potassium: 3.9 mmol/L (ref 3.5–5.3)
Sodium: 142 mmol/L (ref 135–146)
Total Bilirubin: 0.4 mg/dL (ref 0.2–1.2)
Total Protein: 7.2 g/dL (ref 6.1–8.1)
eGFR: 70 mL/min/{1.73_m2} (ref >=60)

## 2021-06-26 LAB — CBC
HCT: 46.2 % — ABNORMAL HIGH (ref 35.0–45.0)
Hemoglobin: 14.7 g/dL (ref 11.7–15.5)
MCH: 26.2 pg — ABNORMAL LOW (ref 27.0–33.0)
MCHC: 31.8 g/dL — ABNORMAL LOW (ref 32.0–36.0)
MCV: 82.2 fL (ref 80.0–100.0)
MPV: 9.9 fL (ref 7.5–12.5)
Platelets: 306 10*3/uL (ref 140–400)
RBC: 5.62 10*6/uL — ABNORMAL HIGH (ref 3.80–5.10)
RDW: 15.1 % — ABNORMAL HIGH (ref 11.0–15.0)
WBC: 13.3 10*3/uL — ABNORMAL HIGH (ref 3.8–10.8)

## 2021-06-26 LAB — LIPID PANEL
Cholesterol: 165 mg/dL (ref <200)
HDL: 38 mg/dL — ABNORMAL LOW (ref >=50)
LDL Cholesterol (Calc): 102 mg/dL (calc) — ABNORMAL HIGH
Non-HDL Cholesterol (Calc): 127 mg/dL (calc) (ref <130)
Total CHOL/HDL Ratio: 4.3 (calc) (ref <5.0)
Triglycerides: 151 mg/dL — ABNORMAL HIGH (ref <150)

## 2021-06-26 LAB — VITAMIN D 25 HYDROXY (VIT D DEFICIENCY, FRACTURES): Vit D, 25-Hydroxy: 96 ng/mL (ref 30–100)

## 2021-06-26 LAB — TSH: TSH: 1.29 mIU/L (ref 0.40–4.50)

## 2021-06-26 NOTE — Patient Instructions (Signed)

## 2021-06-26 NOTE — Progress Notes (Signed)
06/26/2021 Kristen Shaffer   Jul 24, 1956  [id number]  Primary Physician Nolene Ebbs, MD Primary Cardiologist: [name] Harp MD [name], [place]  HPI:  Kristen Shaffer is a 65 y.o.  morbidly overweight single African-American female with no biologic children who has been disabled since age 72.  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  She has chronic pain because of her back as well as long bouts of depression.  She does have a history of hypertension.  She is never had a heart tach or stroke.  She has had GERD as well.  She was hospitalized in August because of UTI and lower extreme edema.  She is found to be COVID positive.  She was treated with diuretics.  2D echo revealed normal LV systolic function with diastolic dysfunction.  I last saw her in the office 08/01/2020.   She has been seen in the weight loss clinic and is slowly losing weight.  Her diet has been modified.  She is been seeing our Pharm.D.'s in the office for blood pressure monitoring and medication adjustments.  Blood pressures have been under better control.  She feels clinically improved.  I did ask her questions regarding sleep apnea and apparently she is been tested in the past and does not have this clinically.  Since I saw her a year ago her weight has remained stable.  She does walk with a cane.  She is chronically short of breath probably from deconditioning and morbid obesity.   Current Meds  Medication Sig   acetaminophen (TYLENOL) 650 MG CR tablet Take 650 mg by mouth every 8 (eight) hours as needed for pain.   Blood Pressure Monitoring (BLOOD PRESSURE CUFF) MISC 1 Package by Does not apply route daily.   carvedilol (COREG) 6.25 MG tablet TAKE 1 TABLET TWICE DAILY WITH MEALS   levonorgestrel (MIRENA, 52 MG,) 20 MCG/24HR IUD Mirena 20 mcg/24 hours (7 yrs) 52 mg intrauterine device  Take 1 device by intrauterine route.   methocarbamol (ROBAXIN) 750 MG tablet Take 750 mg by mouth daily.   omeprazole  (PRILOSEC) 40 MG capsule Take 40 mg by mouth 2 (two) times daily.   oxybutynin (DITROPAN) 5 MG tablet Take 5 mg by mouth 2 (two) times daily.   polyethylene glycol (MIRALAX / GLYCOLAX) 17 g packet Take 17 g by mouth daily as needed.   spironolactone (ALDACTONE) 25 MG tablet TAKE 1 TABLET EVERY DAY   torsemide (DEMADEX) 20 MG tablet Take 1 tablet (20 mg total) by mouth 2 (two) times daily.   triamcinolone (KENALOG) 0.025 % ointment Apply a pea sized amount topically BID for 1-2 weeks with a flare, then change to a pea sized amount topically 2 x a week.   Vitamin D, Ergocalciferol, (DRISDOL) 1.25 MG (50000 UNIT) CAPS capsule Take 50,000 Units by mouth once a week.     No Known Allergies  Social History   Socioeconomic History   Marital status: Single    Spouse name: Not on file   Number of children: Not on file   Years of education: Not on file   Highest education level: Not on file  Occupational History   Not on file  Tobacco Use   Smoking status: Never   Smokeless tobacco: Never  Vaping Use   Vaping Use: Never used  Substance and Sexual Activity   Alcohol use: Yes    Alcohol/week: 0.0 standard drinks    Comment: occ   Drug use:  No   Sexual activity: Yes    Birth control/protection: Post-menopausal    Comment: 1st intercourse 79 yo-5 partners-Mirena 2018?  Other Topics Concern   Not on file  Social History Narrative   Not on file   Social Determinants of Health   Financial Resource Strain: Not on file  Food Insecurity: Not on file  Transportation Needs: Not on file  Physical Activity: Not on file  Stress: Not on file  Social Connections: Not on file  Intimate Partner Violence: Not on file     Review of Systems: General: negative for chills, fever, night sweats or weight changes.  Cardiovascular: negative for chest pain, dyspnea on exertion, edema, orthopnea, palpitations, paroxysmal nocturnal dyspnea or shortness of breath Dermatological: negative for  rash Respiratory: negative for cough or wheezing Urologic: negative for hematuria Abdominal: negative for nausea, vomiting, diarrhea, bright red blood per rectum, melena, or hematemesis Neurologic: negative for visual changes, syncope, or dizziness All other systems reviewed and are otherwise negative except as noted above.    Blood pressure 96/62, pulse 88, height '5\' 9"'$  (1.753 m), weight 294 lb (133.4 kg), SpO2 97 %.  General appearance: alert and no distress Neck: no adenopathy, no carotid bruit, no JVD, supple, symmetrical, trachea midline, and thyroid not enlarged, symmetric, no tenderness/mass/nodules Lungs: clear to auscultation bilaterally Heart: regular rate and rhythm, S1, S2 normal, no murmur, click, rub or gallop Extremities: extremities normal, atraumatic, no cyanosis or edema Pulses: 2+ and symmetric Skin: Skin color, texture, turgor normal. No rashes or lesions Neurologic: Grossly normal  EKG sinus rhythm at 88 with right bundle branch block.  I personally reviewed this EKG.  ASSESSMENT AND PLAN:   Morbid obesity ([place]) BMI 43, weight unchanged from last year despite going to a weight loss clinic.  Patient admits to dietary indiscretion.  Hypertension History of essential hypertension blood pressure measured today at 96/62.  She is on low-dose carvedilol and takes torsemide for lower extremity edema.  Diastolic dysfunction History of diastolic dysfunction on torsemide with 2D echo performed 07/18/2019 revealing normal LV systolic function with relaxation abnormalities.  Leg swelling History of bilateral lower extremity edema on torsemide.  She appears dry today.     [name] Harp MD FACP,FACC,FAHA, [facility] 06/26/2021 11:51 AM

## 2021-06-26 NOTE — Assessment & Plan Note (Signed)
History of diastolic dysfunction on torsemide with 2D echo performed 07/18/2019 revealing normal LV systolic function with relaxation abnormalities.

## 2021-06-26 NOTE — Assessment & Plan Note (Signed)
History of bilateral lower extremity edema on torsemide.  She appears dry today.

## 2021-06-26 NOTE — Assessment & Plan Note (Signed)
BMI 43, weight unchanged from last year despite going to a weight loss clinic.  Patient admits to dietary indiscretion.

## 2021-06-26 NOTE — Assessment & Plan Note (Signed)
History of essential hypertension blood pressure measured today at 96/62.  She is on low-dose carvedilol and takes torsemide for lower extremity edema.

## 2021-07-16 ENCOUNTER — Telehealth: Payer: Self-pay

## 2021-07-16 NOTE — Telephone Encounter (Signed)
   [facility] Pre-operative Risk Assessment    Patient Name: Kristen Shaffer  DOB: 01-Jun-1956 MRN: [id number]  HEARTCARE STAFF:  - IMPORTANT!!!!!! Under Visit Info/Reason for Call, type in Other and utilize the format Clearance MM/DD/YY or Clearance TBD. Do not use dashes or single digits. - Please review there is not already an duplicate clearance open for this procedure. - If request is for dental extraction, please clarify the # of teeth to be extracted. - If the patient is currently at the dentist's office, call Pre-Op Callback Staff (MA/nurse) to input urgent request.  - If the patient is not currently in the dentist office, please route to the Pre-Op pool.  Request for surgical clearance:  What type of surgery is being performed? Colonoscopy   When is this surgery scheduled? 08/08/21  What type of clearance is required (medical clearance vs. Pharmacy clearance to hold med vs. Both)? Medical   Are there any medications that need to be held prior to surgery and how long? None   Practice name and name of physician performing surgery? [facility] Dr. Benson [place]   What is the office phone number? [phone]   7.   What is the office fax number? [phone]  8.   Anesthesia type (None, local, MAC, general) ? Unknown    Zebedee Iba 07/16/2021, 9:40 AM  _________________________________________________________________   (provider comments below)

## 2021-07-16 NOTE — Telephone Encounter (Signed)
    Patient Name: Kristen Shaffer  DOB: August 03, 1956 MRN: [id number]  Primary Cardiologist: Dr. Gwenlyn Found  Chart reviewed as part of pre-operative protocol coverage. Patient was recently seen by Dr. Gwenlyn Found on 06/26/2021 at which time she was stable from a cardiac standpoint. She has chronic shortness of breath felt to primarily be due to deconditioning and morbid obesity. She was not felt to be volume overloaded. She was advised to follow-up in 12 months. Given past medical history and time since last visit, based on ACC/AHA guidelines, JACKQUELYN COTTEN would be at acceptable risk for the planned procedure without further cardiovascular testing.   I will route this recommendation to the requesting party via Epic fax function and remove from pre-op pool.  Please call with questions.  Darreld Mclean, PA-C 07/16/2021, 10:13 AM

## 2021-07-21 ENCOUNTER — Encounter (HOSPITAL_COMMUNITY): Payer: Self-pay | Admitting: Emergency Medicine

## 2021-07-21 ENCOUNTER — Emergency Department (HOSPITAL_COMMUNITY)
Admission: EM | Admit: 2021-07-21 | Discharge: 2021-07-21 | Disposition: A | Discharge status: Home / Self Care | Payer: Medicare Other | Attending: Emergency Medicine | Admitting: Emergency Medicine

## 2021-07-21 DIAGNOSIS — K047 Periapical abscess without sinus: Secondary | ICD-10-CM | POA: Insufficient documentation

## 2021-07-21 DIAGNOSIS — K0889 Other specified disorders of teeth and supporting structures: Secondary | ICD-10-CM

## 2021-07-21 DIAGNOSIS — Z79899 Other long term (current) drug therapy: Secondary | ICD-10-CM | POA: Diagnosis not present

## 2021-07-21 DIAGNOSIS — Z8616 Personal history of COVID-19: Secondary | ICD-10-CM | POA: Insufficient documentation

## 2021-07-21 DIAGNOSIS — I10 Essential (primary) hypertension: Secondary | ICD-10-CM | POA: Diagnosis not present

## 2021-07-21 DIAGNOSIS — J45909 Unspecified asthma, uncomplicated: Secondary | ICD-10-CM | POA: Insufficient documentation

## 2021-07-21 MED ORDER — PENICILLIN V POTASSIUM 500 MG PO TABS: 500.0000 mg | ORAL_TABLET | Freq: Four times a day (QID) | ORAL | 0 refills | Status: AC

## 2021-07-21 NOTE — ED Provider Notes (Signed)
[facility] EMERGENCY DEPARTMENT Provider Note   CSN: [id number] Arrival date & time: 07/21/21  1655    History  Kristen Shaffer is a 65 y.o. female presenting with tooth pain.  Patient reports tooth pain in one of her right maxillary teeth. Started 3 days ago. Pain is adequately controlled with extra strength Tylenol. States she's had multiple dental problems over the last few years but has neglected them due to depression and financial difficulties.  She also noticed one tooth looks green so she wanted to ensure there was no infection. States she has a history of CHF as well as rheumatic fever in childhood so she's worried about her dental problems affecting her heart.  No fever or chills. No swelling, bleeding, or jaw pain. No headache or vision changes.    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   hysteroscopy D&C specimen   Depression    Fibroid    GERD (gastroesophageal reflux disease)    Herniated disc    Hypertension    no medications at this time   Lichen sclerosus    PONV (postoperative nausea and vomiting)    Rheumatic fever    in childhood   Sciatic leg pain    Vocal cord nodule     Patient Active Problem List   Diagnosis Date Noted   Elevated testosterone level [id number]   Diastolic dysfunction [id number]   Leg swelling    Morbid obesity ([place]) 06/11/2019   Weakness    Acute respiratory failure with hypoxia ([place]) 06/09/2019   COVID-19 virus infection 06/08/2019   GERD (gastroesophageal reflux disease)    UTI (urinary tract infection)    AKI (acute kidney injury) ([place])    Arthritis    Hypertension    Depression    Fibroid     Past Surgical History:  Procedure Laterality Date   COLONOSCOPY     DILATATION & CURETTAGE/HYSTEROSCOPY WITH MYOSURE N/A 10/23/2015    Procedure: DILATATION & CURETTAGE/HYSTEROSCOPY WITH MYOSURE;  Surgeon: Anastasio Auerbach, MD;  Location: [facility];  Service: Gynecology;  Laterality: N/A;   DILATION AND CURETTAGE OF UTERUS     HYSTEROSCOPY     MYOMECTOMY  1999   TONSILLECTOMY       OB History     Gravida  2   Para      Term      Preterm      AB  2   Living  0      SAB      IAB      Ectopic      Multiple      Live Births              Family History  Problem Relation Age of Onset   Diabetes Mother    Hypertension Mother    Diabetes Father    Heart disease Sister    Cancer Sister        Colon   Cancer Brother        Stomach   Hypertension Maternal Grandmother    Diabetes Maternal Grandmother    Polycystic ovary syndrome Neg Hx     Social History   Tobacco Use   Smoking status: Never   Smokeless tobacco: Never  Vaping  Use   Vaping Use: Never used  Substance Use Topics   Alcohol use: Yes    Alcohol/week: 0.0 standard drinks    Comment: occ   Drug use: No    Home Medications Prior to Admission medications   Medication Sig Start Date End Date Taking? Authorizing Provider  penicillin v potassium (VEETID) 500 MG tablet Take 1 tablet (500 mg total) by mouth 4 (four) times daily for 7 days. 07/21/21 07/28/21 Yes Alcus Dad, MD  acetaminophen (TYLENOL) 650 MG CR tablet Take 650 mg by mouth every 8 (eight) hours as needed for pain.    [provider]  amitriptyline (ELAVIL) 25 MG tablet Take 25 mg by mouth at bedtime. 25-'50mg'$  Patient not taking: No sig reported    [provider]  Blood Pressure Monitoring (BLOOD PRESSURE CUFF) MISC 1 Package by Does not apply route daily. 08/29/19   [name] Harp, MD  carvedilol (COREG) 6.25 MG tablet TAKE 1 TABLET TWICE DAILY WITH MEALS 08/09/20   [name] Harp, MD  levonorgestrel (MIRENA, 52 MG,) 20 MCG/24HR IUD Mirena 20 mcg/24 hours (7 yrs) 52 mg intrauterine device  Take 1 device by intrauterine route.    [provider]  methocarbamol (ROBAXIN) 750 MG tablet Take 750 mg by mouth 2 (two) times daily as needed. Patient not taking: Reported on 06/26/2021    [provider]  methocarbamol (ROBAXIN) 750 MG tablet Take 750 mg by mouth daily.    [provider]  metroNIDAZOLE (FLAGYL) 500 MG tablet Take 1 tablet (500 mg total) by mouth 2 (two) times daily. Patient not taking: No sig reported 06/18/21   Chase Picket, MD  omeprazole (PRILOSEC) 40 MG capsule Take 40 mg by mouth 2 (two) times daily. 03/18/21   [provider]  oxybutynin (DITROPAN) 5 MG tablet Take 5 mg by mouth 2 (two) times daily. 06/05/21   [provider]  polyethylene glycol (MIRALAX / GLYCOLAX) 17 g packet Take 17 g by mouth daily as needed. 07/22/19   Thurnell Lose, MD  spironolactone (ALDACTONE) 25 MG tablet TAKE 1 TABLET EVERY DAY 12/11/20   [name] Harp, MD  torsemide (DEMADEX) 20 MG tablet Take 1 tablet (20 mg total) by mouth 2 (two) times daily. 08/24/19 06/26/21  [name] Harp, MD  triamcinolone (KENALOG) 0.025 % ointment Apply a pea sized amount topically BID for 1-2 weeks with a flare, then change to a pea sized amount topically 2 x a week. 04/10/21   [name], MD  Vitamin D, Ergocalciferol, (DRISDOL) 1.25 MG (50000 UNIT) CAPS capsule Take 50,000 Units by mouth once a week. 04/02/21   [provider]    Allergies    Patient has no known allergies.  Review of Systems   Review of Systems  Constitutional:  Negative for chills and fever.  HENT:  Positive for dental problem. Negative for facial swelling.   Eyes:  Negative for visual disturbance.  Neurological:  Negative for headaches.   Physical Exam Updated Vital Signs BP 130/80 (BP Location: Right Arm)   Pulse 85   Temp 98.9 F (37.2 C) (Oral)   Resp 16   SpO2 100%   Physical Exam Constitutional:      General: She is not in acute distress.    Appearance: Normal appearance. She is not toxic-appearing.   HENT:     Mouth/Throat:     Mouth: Mucous membranes are moist.     Comments: Poor dentition overall. Several maxillary  and mandibular teeth missing on the left.  Multiple caries on the right. Tender to palpation of right maxillary gumline Cardiovascular:     Rate and Rhythm: Normal rate and regular rhythm.     Heart sounds: Normal heart sounds.  Pulmonary:     Effort: Pulmonary effort is normal.  Skin:    General: Skin is warm.  Neurological:     General: No focal deficit present.     Mental Status: She is alert.     ED Results / Procedures / Treatments   Labs (all labs ordered are listed, but only abnormal results are displayed) Labs Reviewed - No data to display  EKG None  Radiology No results found.  Procedures Procedures   Medications Ordered in ED Medications - No data to display  ED Course  I have reviewed the triage vital signs and the nursing notes.  Pertinent labs & imaging results that were available during my care of the patient were reviewed by me and considered in my medical decision making (see chart for details).    MDM Rules/Calculators/A&P                         65 year old female presents with tooth pain x3 days. No fever, chills or other associated symptoms.  On exam patient with overall poor dentition. She has obvious caries and deformities of several maxillary teeth on the right. She has tenderness throughout the right maxillary gumline but no abscess appreciated. Question early dental infection versus pain from exposed nerve versus local irritation from mis-shaped tooth versus chronic caries.  Will treat with 7 day antibiotic course and emphasized importance of outpatient follow up with a dentist. Given community dental resources in [place].  Final Clinical Impression(s) / ED Diagnoses Final diagnoses:  Tooth pain  Dental infection    Rx / DC Orders ED Discharge Orders          Ordered    penicillin v potassium (VEETID) 500 MG tablet  4  times daily        07/21/21 2103           Alcus Dad, MD PGY-2 [facility] Medicine   Alcus Dad, MD 07/21/21 2224    Lucrezia Starch, MD 07/23/21 2237

## 2021-07-21 NOTE — Discharge Instructions (Addendum)
You were seen in the Emergency Department for dental pain. We have sent antibiotics to your pharmacy which you should take 4 times per day for the next 7 days. Continue taking Tylenol as needed for pain. It is very important to follow up with a dentist. We have provided a list of dental offices that can provide care at a reduced cost.

## 2021-07-21 NOTE — ED Provider Notes (Signed)
Emergency Medicine Provider Triage Evaluation Note  Kristen Shaffer , a 65 y.o. female  was evaluated in triage.  Pt complains of dental pain.  Pain is present over the last 2 to 3 days.  Pain is improved with Tylenol.  Patient reports he came to the emergency department because someone told her to screen and she was concerned.  Review of Systems  Positive: Dental pain Negative: Trouble swallowing, sore throat, fevers, chills, neck stiffness  Physical Exam  BP 131/84 (BP Location: Left Arm)   Pulse 87   Temp 99.2 F (37.3 C) (Oral)   Resp 16   SpO2 100%  Gen:   Awake, no distress   Resp:  Normal effort  MSK:   Moves extremities without difficulty  Other:  Patient able to handle oral secretions without difficulty.  Patient has poor dentition with multiple dental caries, missing teeth and broken teeth.  No dental abscess seen.  No swelling noted to oral mucosa.  No peritonsillar abscess.  No facial swelling.  Medical Decision Making  Medically screening exam initiated at 5:24 PM.  Appropriate orders placed.  Kristen Shaffer was informed that the remainder of the evaluation will be completed by another provider, this initial triage assessment does not replace that evaluation, and the importance of remaining in the ED until their evaluation is complete.  The patient appears stable so that the remainder of the work up may be completed by another provider.      Loni Beckwith, PA-C 07/21/21 Downing, Ankit, MD 07/25/21 [phone]

## 2021-07-21 NOTE — ED Triage Notes (Signed)
C/o R upper dental pain x 2-3 days.  States pain is better with Tylenol but she had someone look at tooth and said it was green.

## 2021-07-29 ENCOUNTER — Other Ambulatory Visit: Payer: Self-pay | Admitting: Cardiovascular Disease

## 2021-08-27 ENCOUNTER — Other Ambulatory Visit: Payer: Self-pay | Admitting: Internal Medicine

## 2021-08-30 LAB — BASIC METABOLIC PANEL WITH GFR
BUN/Creatinine Ratio: 21 (calc) (ref 6–22)
BUN: 22 mg/dL (ref 7–25)
CO2: 20 mmol/L (ref 20–32)
Calcium: 9.1 mg/dL (ref 8.6–10.4)
Chloride: 104 mmol/L (ref 98–110)
Creat: 1.07 mg/dL — ABNORMAL HIGH (ref 0.50–1.05)
Glucose, Bld: 117 mg/dL — ABNORMAL HIGH (ref 65–99)
Potassium: 3.9 mmol/L (ref 3.5–5.3)
Sodium: 138 mmol/L (ref 135–146)
eGFR: 57 mL/min/{1.73_m2} — ABNORMAL LOW (ref >=60)

## 2021-08-30 LAB — EXTRA LAV TOP TUBE

## 2021-08-30 LAB — TESTOSTERONE, FREE & TOTAL
Free Testosterone: 7.6 pg/mL — ABNORMAL HIGH (ref 0.1–6.4)
Testosterone, Total, LC-MS-MS: 73 ng/dL — ABNORMAL HIGH (ref 2–45)

## 2021-09-10 ENCOUNTER — Other Ambulatory Visit: Payer: Self-pay | Admitting: Cardiovascular Disease

## 2021-09-12 ENCOUNTER — Telehealth: Payer: Self-pay | Admitting: Cardiovascular Disease

## 2021-09-12 NOTE — Telephone Encounter (Signed)
  *  STAT* If patient is at the pharmacy, call can be transferred to refill team.   1. Which medications need to be refilled? (please list name of each medication and dose if known)  carvedilol (COREG) 6.25 MG tablet  2. Which pharmacy/location (including street and city if local pharmacy) is medication to be sent to? [facility] [place], [place]  3. Do they need a 30 day or 90 day supply? 90 days  Pt said, she was told by [facility] she will be receiving her meds in 7-10 days and she only have 1 pill left, she wanted to resend prescription to her local pharmacy instead

## 2021-09-12 NOTE — Telephone Encounter (Signed)
Patient called to check on status of refill.

## 2021-09-13 MED ORDER — CARVEDILOL 6.25 MG PO TABS: 6.2500 mg | ORAL_TABLET | Freq: Two times a day (BID) | ORAL | 3 refills | Status: DC

## 2021-09-13 NOTE — Telephone Encounter (Signed)
Patient is  calling need Rx sent to local pharmacy . Mail order has not  arrived.  E-sent Rx- carvedilol 6.25 mg twice a day

## 2021-09-13 NOTE — Telephone Encounter (Signed)
   As noted on the request, pt is not getting her refill through [place] mail order pharmacy until 7-10 days, she wanted to cancel the refill there and send it to her local pharmacy since she only have 1 pill left.

## 2021-10-11 ENCOUNTER — Encounter (HOSPITAL_COMMUNITY): Payer: Self-pay | Admitting: *Deleted

## 2021-10-11 ENCOUNTER — Other Ambulatory Visit: Payer: Self-pay

## 2021-10-11 ENCOUNTER — Emergency Department (HOSPITAL_COMMUNITY)
Admission: EM | Admit: 2021-10-11 | Discharge: 2021-10-11 | Disposition: A | Discharge status: Home / Self Care | Payer: Medicare Other | Attending: Emergency Medicine | Admitting: Emergency Medicine

## 2021-10-11 DIAGNOSIS — K029 Dental caries, unspecified: Secondary | ICD-10-CM | POA: Insufficient documentation

## 2021-10-11 DIAGNOSIS — Z8616 Personal history of COVID-19: Secondary | ICD-10-CM | POA: Insufficient documentation

## 2021-10-11 DIAGNOSIS — I5030 Unspecified diastolic (congestive) heart failure: Secondary | ICD-10-CM | POA: Diagnosis not present

## 2021-10-11 DIAGNOSIS — I110 Hypertensive heart disease with heart failure: Secondary | ICD-10-CM | POA: Insufficient documentation

## 2021-10-11 DIAGNOSIS — J45909 Unspecified asthma, uncomplicated: Secondary | ICD-10-CM | POA: Diagnosis not present

## 2021-10-11 DIAGNOSIS — K047 Periapical abscess without sinus: Secondary | ICD-10-CM | POA: Insufficient documentation

## 2021-10-11 DIAGNOSIS — Z79899 Other long term (current) drug therapy: Secondary | ICD-10-CM | POA: Diagnosis not present

## 2021-10-11 DIAGNOSIS — K0889 Other specified disorders of teeth and supporting structures: Secondary | ICD-10-CM | POA: Diagnosis present

## 2021-10-11 MED ORDER — PENICILLIN V POTASSIUM 250 MG PO TABS
500.0000 mg | ORAL_TABLET | Freq: Once | ORAL | Status: AC
  Administered 2021-10-11: 500 mg via ORAL
  Filled 2021-10-11: qty 2

## 2021-10-11 MED ORDER — PENICILLIN V POTASSIUM 500 MG PO TABS: 500.0000 mg | ORAL_TABLET | Freq: Four times a day (QID) | ORAL | 0 refills | Status: AC

## 2021-10-11 NOTE — ED Triage Notes (Signed)
The pt has infected teeth for months she does not have the money to see a dentist

## 2021-10-11 NOTE — ED Provider Notes (Signed)
[facility] Provider Note   CSN: [id number] Arrival date & time: 10/11/21  1856     History Chief Complaint  Patient presents with   Dental Problem    Kristen Shaffer is a 65 y.o. female.  The history is provided by the patient.  Dental Pain Location:  Upper Upper teeth location:  7/RU lateral incisor Quality:  Constant and aching Severity:  Moderate Onset quality:  Gradual Duration:  2 days Timing:  Constant Progression:  Worsening Chronicity:  Recurrent Context: abscess, dental caries and poor dentition   Relieved by:  NSAIDs Worsened by:  Cold food/drink, hot food/drink, touching and jaw movement Ineffective treatments:  None tried Associated symptoms: facial pain and gum swelling   Associated symptoms: no difficulty swallowing, no drooling, no facial swelling, no fever, no oral bleeding, no oral lesions and no trismus   Risk factors: lack of dental care and periodontal disease       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   hysteroscopy D&C specimen   Depression    Fibroid    GERD (gastroesophageal reflux disease)    Herniated disc    Hypertension    no medications at this time   Lichen sclerosus    PONV (postoperative nausea and vomiting)    Rheumatic fever    in childhood   Sciatic leg pain    Vocal cord nodule     Patient Active Problem List   Diagnosis Date Noted   Elevated testosterone level 44/81/8563   Diastolic dysfunction 14/97/0263   Leg swelling    Morbid obesity ([place]) 06/11/2019   Weakness    Acute respiratory failure with hypoxia ([place]) 06/09/2019   COVID-19 virus infection 06/08/2019   GERD (gastroesophageal reflux disease)    UTI (urinary tract infection)    AKI (acute kidney injury) ([place])    Arthritis    Hypertension     Depression    Fibroid     Past Surgical History:  Procedure Laterality Date   COLONOSCOPY     DILATATION & CURETTAGE/HYSTEROSCOPY WITH MYOSURE N/A 10/23/2015   Procedure: DILATATION & CURETTAGE/HYSTEROSCOPY WITH MYOSURE;  Surgeon: Anastasio Auerbach, MD;  Location: [facility];  Service: Gynecology;  Laterality: N/A;   DILATION AND CURETTAGE OF UTERUS     HYSTEROSCOPY     MYOMECTOMY  1999   TONSILLECTOMY       OB History     Gravida  2   Para      Term      Preterm      AB  2   Living  0      SAB      IAB      Ectopic      Multiple      Live Births              Family History  Problem Relation Age of Onset   Diabetes Mother    Hypertension Mother    Diabetes Father    Heart disease Sister    Cancer Sister        Colon   Cancer Brother        Stomach   Hypertension Maternal Grandmother    Diabetes Maternal Grandmother    Polycystic  ovary syndrome Neg Hx     Social History   Tobacco Use   Smoking status: Never   Smokeless tobacco: Never  Vaping Use   Vaping Use: Never used  Substance Use Topics   Alcohol use: Yes    Alcohol/week: 0.0 standard drinks    Comment: occ   Drug use: No    Home Medications Prior to Admission medications   Medication Sig Start Date End Date Taking? Authorizing Provider  penicillin v potassium (VEETID) 500 MG tablet Take 1 tablet (500 mg total) by mouth 4 (four) times daily for 7 days. 10/11/21 10/18/21 Yes Blanchie Dessert, MD  acetaminophen (TYLENOL) 650 MG CR tablet Take 650 mg by mouth every 8 (eight) hours as needed for pain.    [provider]  amitriptyline (ELAVIL) 25 MG tablet Take 25 mg by mouth at bedtime. 25-50mg  Patient not taking: No sig reported    [provider]  Blood Pressure Monitoring (BLOOD PRESSURE CUFF) MISC 1 Package by Does not apply route daily. 08/29/19   [name] Harp, MD  carvedilol (COREG) 6.25 MG tablet Take 1 tablet (6.25 mg total) by mouth 2 (two) times daily  with a meal. 09/13/21   [name] Harp, MD  levonorgestrel (MIRENA, 52 MG,) 20 MCG/24HR IUD Mirena 20 mcg/24 hours (7 yrs) 52 mg intrauterine device  Take 1 device by intrauterine route.    [provider]  methocarbamol (ROBAXIN) 750 MG tablet Take 750 mg by mouth 2 (two) times daily as needed. Patient not taking: Reported on 06/26/2021    [provider]  methocarbamol (ROBAXIN) 750 MG tablet Take 750 mg by mouth daily.    [provider]  metroNIDAZOLE (FLAGYL) 500 MG tablet Take 1 tablet (500 mg total) by mouth 2 (two) times daily. Patient not taking: No sig reported 06/18/21   Chase Picket, MD  omeprazole (PRILOSEC) 40 MG capsule Take 40 mg by mouth 2 (two) times daily. 03/18/21   [provider]  oxybutynin (DITROPAN) 5 MG tablet Take 5 mg by mouth 2 (two) times daily. 06/05/21   [provider]  polyethylene glycol (MIRALAX / GLYCOLAX) 17 g packet Take 17 g by mouth daily as needed. 07/22/19   Thurnell Lose, MD  spironolactone (ALDACTONE) 25 MG tablet TAKE 1 TABLET EVERY DAY 07/30/21   [name] Harp, MD  torsemide (DEMADEX) 20 MG tablet Take 1 tablet (20 mg total) by mouth 2 (two) times daily. 08/24/19 06/26/21  [name] Harp, MD  triamcinolone (KENALOG) 0.025 % ointment Apply a pea sized amount topically BID for 1-2 weeks with a flare, then change to a pea sized amount topically 2 x a week. 04/10/21   [name], MD  Vitamin D, Ergocalciferol, (DRISDOL) 1.25 MG (50000 UNIT) CAPS capsule Take 50,000 Units by mouth once a week. 04/02/21   [provider]    Allergies    Patient has no known allergies.  Review of Systems   Review of Systems  Constitutional:  Negative for fever.  HENT:  Negative for drooling, facial swelling and mouth sores.   All other systems reviewed and are negative.  Physical Exam Updated Vital Signs BP (!) 137/92 (BP Location: Right Arm)   Pulse 87   Temp 98.7 F (37.1 C) (Oral)   Resp  (!) 22   Ht 5\' 9"  (1.753 m)   Wt 133.4 kg   SpO2 97%   BMI 43.43 kg/m   Physical Exam Vitals and nursing note  reviewed.  Constitutional:      General: She is not in acute distress.    Appearance: Normal appearance.  HENT:     Head: Normocephalic.     Comments: No facial swelling    Mouth/Throat:     Mouth: Mucous membranes are moist.   Eyes:     Extraocular Movements: Extraocular movements intact.     Pupils: Pupils are equal, round, and reactive to light.  Cardiovascular:     Rate and Rhythm: Normal rate.  Pulmonary:     Effort: Pulmonary effort is normal.  Musculoskeletal:     Cervical back: Normal range of motion and neck supple. No tenderness.  Lymphadenopathy:     Cervical: No cervical adenopathy.  Skin:    General: Skin is warm and dry.  Neurological:     General: No focal deficit present.     Mental Status: She is alert and oriented to person, place, and time. Mental status is at baseline.  Psychiatric:        Mood and Affect: Mood normal.        Behavior: Behavior normal.    ED Results / Procedures / Treatments   Labs (all labs ordered are listed, but only abnormal results are displayed) Labs Reviewed - No data to display  EKG None  Radiology No results found.  Procedures Procedures   Medications Ordered in ED Medications  penicillin v potassium (VEETID) tablet 500 mg (has no administration in time range)    ED Course  I have reviewed the triage vital signs and the nursing notes.  Pertinent labs & imaging results that were available during my care of the patient were reviewed by me and considered in my medical decision making (see chart for details).    MDM Rules/Calculators/A&P                           Pt with dental caries and no facial swelling.  No signs of ludwig's angina or difficulty swallowing and no systemic symptoms.  Pt seen in sept for same and symptoms improved after pcn.  Pt has not been able to see dentist due to poor access  to care and financial assistance. Will treat with PCN and have pt f/u with dentist.  She was given Dr. Theodosia Blender name and has the outpt dental resources information as well.  Final Clinical Impression(s) / ED Diagnoses Final diagnoses:  Dental abscess  Dental caries    Rx / DC Orders ED Discharge Orders          Ordered    penicillin v potassium (VEETID) 500 MG tablet  4 times daily        10/11/21 2007             Blanchie Dessert, MD 10/11/21 2014

## 2021-10-11 NOTE — Discharge Instructions (Signed)
Start the penicillin tomorrow.  Do salt water gargles

## 2022-01-03 ENCOUNTER — Other Ambulatory Visit: Payer: Self-pay | Admitting: Cardiovascular Disease

## 2022-01-10 ENCOUNTER — Ambulatory Visit: Payer: Medicare Other | Admitting: Obstetrics and Gynecology

## 2022-01-13 NOTE — Progress Notes (Signed)
66 y.o. G2P0020 Single Black or African American Not Hispanic or Latino female here for annual exam. ?Reports current vulvar irritation, reports Hx of lichen sclerosus. She hasn't been using the steroid ointment recently. Her current irritation is mild, on the left, feels her skin is breaking down some. The current irritation started a week ago.  ? 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 She has a mirena IUD that was placed in 3/17. She had an endometrial biopsy in 4/22 for an incidental finding of thickened endometrium and fluid in her uterus. Biopsy returned with inactive endometrium with progestational changes.  ?No vaginal bleeding. Not currently sexually active. ? ?She has a h/o elevated testosterone levels, normal ovaries, referred to Endocrinology. Felt to be secondary to Aldactone use.  ? ?She see's a Dealer, he started her on a daily antibiotic.  ?She has some urge incontinence. Improved and tolerable.  ? ?H/O chronic pain. ? ?Her sister died in 2023/11/21 of Colon cancer at 18. Youngest of 10 children. Other sister died in 02-13-15 of colon cancer at 53.  ?  ? ?No LMP recorded. Patient is postmenopausal.          ?Sexually active: No.  ?The current method of family planning is IUD and post menopausal status.   Mirena IUD placed 02/13/16 ?Exercising: Yes.     stretches ?Smoker:  no ? ?Health Maintenance: ?Pap:  12/28/19 wnl 08/16/15 Wnl  ?History of abnormal Pap:  no ?MMG:  05/13/21 density B Bi-rads 1 neg  ?BMD:   01/19/20 normal ?Colonoscopy: 01/19/20 Normal f/u 5 years ?TDaP:  unsure ?Gardasil: n/a ? ? reports that she has never smoked. She has never used smokeless tobacco. She reports current alcohol use. She reports that she does not use drugs. Rare ETOH. She helped raise one of her nieces, considers her her daughter.  ? ?Past Medical History:  ?Diagnosis Date  ? Anemia   ? history  ? Arthritis   ? Osteoarthritis  ? Asthma   ? Childhood  ? Bilateral leg edema   ? Colon polyps   ?  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 hysteroscopy D&C specimen  ? Depression   ? Fibroid   ? GERD (gastroesophageal reflux disease)   ? Herniated disc   ? Hypertension   ? no medications at this time  ? Lichen sclerosus   ? PONV (postoperative nausea and vomiting)   ? Rheumatic fever   ? in childhood  ? Sciatic leg pain   ? Vocal cord nodule   ? ? ?Past Surgical History:  ?Procedure Laterality Date  ? COLONOSCOPY    ? DILATATION & CURETTAGE/HYSTEROSCOPY WITH MYOSURE N/A 10/23/2015  ? Procedure: [address];  Surgeon: Anastasio Auerbach, MD;  Location: [facility];  Service: Gynecology;  Laterality: N/A;  ? DILATION AND CURETTAGE OF UTERUS    ? HYSTEROSCOPY    ? MYOMECTOMY  1999  ? TONSILLECTOMY    ? ? ?Current Outpatient Medications  ?Medication Sig Dispense Refill  ? acetaminophen (TYLENOL) 650 MG CR tablet Take 650 mg by mouth every 8 (eight) hours as needed for pain.    ? amitriptyline (ELAVIL) 25 MG tablet Take 25 mg by mouth at bedtime. 25-50mg     ? Blood Pressure Monitoring (BLOOD PRESSURE CUFF) MISC 1 Package by Does not apply route daily. 1 each 0  ? carvedilol (COREG) 6.25 MG tablet TAKE 1 TABLET(6.25 MG) BY MOUTH TWICE DAILY WITH A MEAL 180 tablet  1  ? levonorgestrel (MIRENA, 52 MG,) 20 MCG/24HR IUD Mirena 20 mcg/24 hours (7 yrs) 52 mg intrauterine device ? Take 1 device by intrauterine route.    ? methocarbamol (ROBAXIN) 750 MG tablet Take 750 mg by mouth daily.    ? omeprazole (PRILOSEC) 40 MG capsule Take 40 mg by mouth 2 (two) times daily.    ? oxybutynin (DITROPAN) 5 MG tablet Take 5 mg by mouth 2 (two) times daily.    ? polyethylene glycol (MIRALAX / GLYCOLAX) 17 g packet Take 17 g by mouth daily as needed. 14 each 0  ? spironolactone (ALDACTONE) 25 MG tablet TAKE 1 TABLET EVERY DAY 90 tablet 3  ? triamcinolone (KENALOG) 0.025 % ointment Apply a pea sized amount topically BID for 1-2 weeks with a flare, then change to a pea sized amount topically 2  x a week. 80 g 0  ? Vitamin D, Ergocalciferol, (DRISDOL) 1.25 MG (50000 UNIT) CAPS capsule Take 50,000 Units by mouth once a week.    ? torsemide (DEMADEX) 20 MG tablet Take 1 tablet (20 mg total) by mouth 2 (two) times daily. 180 tablet 3  ? ?No current facility-administered medications for this visit.  ? ? ?Family History  ?Problem Relation Age of Onset  ? Diabetes Mother   ? Hypertension Mother   ? Diabetes Father   ? Heart disease Sister   ? Cancer Sister   ?     Colon  ? Cancer Brother   ?     Stomach  ? Hypertension Maternal Grandmother   ? Diabetes Maternal Grandmother   ? Polycystic ovary syndrome Neg Hx   ? ? ?Review of Systems  ?All other systems reviewed and are negative. ? ?Exam:   ?BP 94/74   Pulse 86   Ht 5\' 9"  (1.753 m)   Wt 295 lb (133.8 kg)   SpO2 94%   BMI 43.56 kg/m?   Weight change: @WEIGHTCHANGE @ Height:   Height: 5\' 9"  (175.3 cm)  ?Ht Readings from Last 3 Encounters:  ?01/21/22 5\' 9"  (1.753 m)  ?10/11/21 5\' 9"  (1.753 m)  ?06/26/21 5\' 9"  (1.753 m)  ? ? ?General appearance: alert, cooperative and appears stated age ?Head: Normocephalic, without obvious abnormality, atraumatic ?Neck: no adenopathy, supple, symmetrical, trachea midline and thyroid normal to inspection and palpation ?Breasts: normal appearance, no masses or tenderness ?Abdomen: soft, non-tender; non distended,  no masses,  no organomegaly ?Extremities: extremities normal, atraumatic, no cyanosis or edema ?Skin: Skin color, texture, turgor normal. No rashes or lesions ?Lymph nodes: Cervical, supraclavicular, and axillary nodes normal. ?No abnormal inguinal nodes palpated ?Neurologic: Grossly normal ? ? ?Pelvic: External genitalia:  diffuse vitiligo of the vulva. Loss of architecture of the clitoris and labia minora, fused in the midline. Thinning of the lower vulvar skin with mild skin breakdown on the left. No legions.  ?             Urethra:  normal appearing urethra with no masses, tenderness or lesions ?              Bartholins and Skenes: normal    ?             Vagina: normal appearing vagina with normal color and discharge, no lesions ?             Cervix: no lesions and not friable, IUD string 3 cm ?              ?Bimanual Exam:  Uterus:  no masses or tenderness ?             Adnexa: no mass, fullness, tenderness ?              Rectovaginal: Confirms ?              Anus:  normal sphincter tone, no lesions ? ?Gae Dry chaperoned for the exam. ? ?1. Encntr for gyn exam (general) (routine) w/o abn findings ?Pap, mammogram, colonoscopy and DEXA are UTD ?Labs with primary MD ? ?2. Lichen sclerosus ?Loss of architecture, no lesions, discussed care including baseline vulvar skin care ?- betamethasone valerate ointment (VALISONE) 0.1 %; Apply a pea sized amount topically q week, can increase to BID for up to 2 weeks as needed for a flare.  Dispense: 45 g; Refill: 2 ? ?3. Vulvovaginitis ?- WET PREP FOR TRICH, YEAST, CLUE: negative ?- SureSwab? Advanced Vaginitis, TMA ?- betamethasone valerate ointment (VALISONE) 0.1 %; Apply a pea sized amount topically q week, can increase to BID for up to 2 weeks as needed for a flare.  Dispense: 45 g; Refill: 2 ? ?4. History of endometrial hyperplasia ?She has continued risk factors for endometrial hyperplasia. ?She has a mirena IUD, placed in 3/17. Discussed that the mirena is only approved for 5 years for endometrial protection. ?She previously didn't tolerate oral progesterone (thinks she was on megace, gained almost 30 lbs in one year). We discussed that there are other options for oral progesterone. ?-Will try and get approval for another mirena IUD, if unable she will consider an alternative oral progesterone. I would not recommend removing the IUD yet, I'm sure it gives her some protection (better than no protection). ? ?5. IUD check up ?See above ? ?

## 2022-01-21 ENCOUNTER — Other Ambulatory Visit: Payer: Self-pay

## 2022-01-21 ENCOUNTER — Encounter: Payer: Self-pay | Admitting: Obstetrics and Gynecology

## 2022-01-21 ENCOUNTER — Ambulatory Visit (INDEPENDENT_AMBULATORY_CARE_PROVIDER_SITE_OTHER): Payer: Medicare HMO | Admitting: Obstetrics and Gynecology

## 2022-01-21 VITALS — BP 94/74 | HR 86 | Ht 69.0 in | Wt 295.0 lb

## 2022-01-21 DIAGNOSIS — Z8742 Personal history of other diseases of the female genital tract: Secondary | ICD-10-CM

## 2022-01-21 DIAGNOSIS — M5126 Other intervertebral disc displacement, lumbar region: Secondary | ICD-10-CM | POA: Insufficient documentation

## 2022-01-21 DIAGNOSIS — Z30431 Encounter for routine checking of intrauterine contraceptive device: Secondary | ICD-10-CM

## 2022-01-21 DIAGNOSIS — Z01419 Encounter for gynecological examination (general) (routine) without abnormal findings: Secondary | ICD-10-CM

## 2022-01-21 DIAGNOSIS — L900 Lichen sclerosus et atrophicus: Secondary | ICD-10-CM | POA: Diagnosis not present

## 2022-01-21 DIAGNOSIS — N760 Acute vaginitis: Secondary | ICD-10-CM | POA: Diagnosis not present

## 2022-01-21 LAB — WET PREP FOR TRICH, YEAST, CLUE

## 2022-01-21 MED ORDER — BETAMETHASONE VALERATE 0.1 % EX OINT: TOPICAL_OINTMENT | CUTANEOUS | 2 refills | Status: DC

## 2022-01-21 NOTE — Patient Instructions (Signed)

## 2022-01-22 LAB — SURESWAB® ADVANCED VAGINITIS,TMA
CANDIDA SPECIES: DETECTED — AB
Candida glabrata: NOT DETECTED
SURESWAB(R) ADV BACTERIAL VAGINOSIS(BV),TMA: POSITIVE — AB
TRICHOMONAS VAGINALIS (TV),TMA: NOT DETECTED

## 2022-01-23 ENCOUNTER — Telehealth: Payer: Self-pay

## 2022-01-23 ENCOUNTER — Other Ambulatory Visit: Payer: Self-pay

## 2022-01-23 MED ORDER — FLUCONAZOLE 150 MG PO TABS: ORAL_TABLET | ORAL | 0 refills | Status: DC

## 2022-01-23 MED ORDER — METRONIDAZOLE 500 MG PO TABS: 500.0000 mg | ORAL_TABLET | Freq: Two times a day (BID) | ORAL | 0 refills | Status: DC

## 2022-01-23 NOTE — Telephone Encounter (Signed)
Patient wanted to provide Dr. Talbert Nan with additional information. ? ?She had AEX 01/21/22. ? ?She wanted Dr. Talbert Nan to note that Dr. McDiarmid has her on Trimethoprim 100 mg daily long term prophylactically. ? ?She wanted Dr. Talbert Nan to know that it was Medroxyprogesterone 10 mg that she took that made her gain 28 pounds in 30 days.  ?

## 2022-02-03 NOTE — Telephone Encounter (Signed)
The trimethoprim has been added to her medication list.  ? ?If the provera caused weight gain previously, we could try another oral progesterone. Megace is typically recommended for treatment of hyperplasia, but is more likely to cause weight gain than provera.  ?The best option is the mirena IUD. If we can't get approval for the IUD (please check with [facility]), then we could try aygestin 5 mg daily.  ?Please let her know and see how she feels about that option.  ?

## 2022-02-04 NOTE — Telephone Encounter (Signed)
Left message for patient to call in her voice mail. 

## 2022-02-05 NOTE — Telephone Encounter (Signed)
I spoke with patient and relayed Dr. Gentry Fitz message. ? ?Patient said she is happy to try IUD. Of note she said she currently has IUD in place x 6 years.  She said it costed her $1000 some dollars and she cannot afford to pay anything now as she is having some serious financial difficulties but is willing if covered 100%.  ? ?She asked about the Aygestin and if it will cause weight gain as well. ?

## 2022-02-05 NOTE — Telephone Encounter (Signed)
Patient called in voice mail. I returned her call and received her voice mail. I left message that I will try her again or she can call me back as well. ?

## 2022-02-06 NOTE — Telephone Encounter (Signed)
Spoke with patient and informed her. She wants to proceed with seeing if Mirena IUD will be covered and how much it might cost her. ? ?Will route to [facility] to check on this. ?

## 2022-02-06 NOTE — Telephone Encounter (Signed)
I don't know if she would gain weight with the Aygestin. I don't typically have patient's c/o weight gain on Aygestin, but it is possible.  ?

## 2022-02-06 NOTE — Telephone Encounter (Signed)
Prior Authorization has been submitted to [facility]. ?

## 2022-02-07 ENCOUNTER — Other Ambulatory Visit: Payer: Self-pay

## 2022-02-07 ENCOUNTER — Telehealth: Payer: Self-pay

## 2022-02-07 DIAGNOSIS — Z8742 Personal history of other diseases of the female genital tract: Secondary | ICD-10-CM

## 2022-02-07 DIAGNOSIS — Z30433 Encounter for removal and reinsertion of intrauterine contraceptive device: Secondary | ICD-10-CM

## 2022-02-07 MED ORDER — MISOPROSTOL 200 MCG PO TABS
ORAL_TABLET | ORAL | 0 refills | Status: DC
Start: 2022-02-07 — End: 2022-03-20

## 2022-02-07 NOTE — Telephone Encounter (Signed)
FYI. Pt notified and voiced understanding.  

## 2022-02-07 NOTE — Telephone Encounter (Signed)
I've called in 2 tablets of cytotec to her pharmacy. He should place both tablets up in her vagina right before she goes to bed the night prior to the IUD insertion. Please warn her that it may make her crampy.  ?If she can take ibuprofen she should take that 1 hour prior to her appointment. If she can't take ibuprofen, she should take tyleno.l. ?

## 2022-02-07 NOTE — Telephone Encounter (Signed)
Pt has question about a medication that was given to her before when she got an iud . She wants to know if she can get  one again before getting this iud to make it easier??? She said that she doesn't remember the name of it.  ? ?Left detailed msg on pt's VM per DPR re: if she remembers if the medication was something she took orally or inserted in the vagina. Asked pt to call back and let us know.  ?

## 2022-02-07 NOTE — Telephone Encounter (Signed)
Prior Authorization obtained. ? ?PA#: [id number] valid 02/10/2022-03/11/2022. ? ?Encounter previously closed. ?

## 2022-02-07 NOTE — Telephone Encounter (Signed)
Patient called back to report that she remembers that it was a medication she placed in vagina prior to procedure. Please advise.  ?

## 2022-02-10 ENCOUNTER — Encounter: Payer: Self-pay | Admitting: Obstetrics and Gynecology

## 2022-02-10 ENCOUNTER — Other Ambulatory Visit: Payer: Self-pay

## 2022-02-10 ENCOUNTER — Other Ambulatory Visit (HOSPITAL_COMMUNITY)
Admission: RE | Admit: 2022-02-10 | Discharge: 2022-02-10 | Disposition: A | Discharge status: Home / Self Care | Payer: Medicare HMO | Source: Ambulatory Visit | Attending: Obstetrics and Gynecology | Admitting: Obstetrics and Gynecology

## 2022-02-10 ENCOUNTER — Ambulatory Visit (INDEPENDENT_AMBULATORY_CARE_PROVIDER_SITE_OTHER): Payer: Medicare HMO | Admitting: Obstetrics and Gynecology

## 2022-02-10 VITALS — BP 122/68 | HR 102 | Ht 69.0 in | Wt 299.0 lb

## 2022-02-10 DIAGNOSIS — Z8742 Personal history of other diseases of the female genital tract: Secondary | ICD-10-CM | POA: Insufficient documentation

## 2022-02-10 DIAGNOSIS — N950 Postmenopausal bleeding: Secondary | ICD-10-CM | POA: Insufficient documentation

## 2022-02-10 DIAGNOSIS — Z30433 Encounter for removal and reinsertion of intrauterine contraceptive device: Secondary | ICD-10-CM

## 2022-02-10 NOTE — Patient Instructions (Signed)
Endometrial Biopsy Post-procedure Instructions ?Cramping is common.  You may take Ibuprofen, Aleve, or Tylenol for the cramping.  This should resolve within 24 hours.   ?You may have a small amount of spotting.  You should wear a mini pad for the next few days. ?You may have intercourse in 24 hours. ?You need to call the office if you have any pelvic pain, fever, heavy bleeding, or foul smelling vaginal discharge. ?Shower or bathe as normal ?You will be notified within one week of your biopsy results or we will discuss your results at your follow-up appointment if needed. ? ?IUD Post-procedure Instructions ?Cramping is common.  You may take Ibuprofen, Aleve, or Tylenol for the cramping.  This should resolve within 24 hours.   ?You may have a small amount of spotting.  You should wear a mini pad for the next few days. ?You may have intercourse in 24 hours. ?You need to call the office if you have any pelvic pain, fever, heavy bleeding, or foul smelling vaginal discharge. ?Shower or bathe as normal ? ?

## 2022-02-10 NOTE — Progress Notes (Signed)
GYNECOLOGY  VISIT ?  ?HPI: ?66 y.o.   Single Black or African American Not Hispanic or Latino  female   ?[id number] with No LMP recorded. Patient is postmenopausal.   ?here for Mirena IUD removal and reinsertion insertion secondary to a h/o endometrial hyperplasia and continued risk factors.  ? ?She was pretreated with cytotec. ? ?She is having light bleeding this morning. No bleeding prior. ? ?GYNECOLOGIC HISTORY: ?No LMP recorded. Patient is postmenopausal. ?Contraception:IUD and PMP  ?Menopausal hormone therapy: no ?       ?OB History   ? ? Gravida  ?2  ? Para  ?   ? Term  ?   ? Preterm  ?   ? AB  ?2  ? Living  ?0  ?  ? ? SAB  ?   ? IAB  ?   ? Ectopic  ?   ? Multiple  ?   ? Live Births  ?   ?   ?  ?  ?    ? ?Patient Active Problem List  ? Diagnosis Date Noted  ? Lumbar herniated disc 01/21/2022  ? Elevated testosterone level 05/25/2021  ? Vocal nodules in adults 04/17/2020  ? Hoarseness 10/04/2019  ? Diastolic dysfunction 54/49/2010  ? Leg swelling   ? Morbid obesity ([place]) 06/11/2019  ? Weakness   ? Acute respiratory failure with hypoxia ([place]) 06/09/2019  ? COVID-19 virus infection 06/08/2019  ? GERD (gastroesophageal reflux disease)   ? AKI (acute kidney injury) ([place])   ? Arthritis   ? Hypertension   ? Depression   ? Fibroid   ? ? ?Past Medical History:  ?Diagnosis Date  ? Anemia   ? history  ? Arthritis   ? Osteoarthritis  ? Asthma   ? Childhood  ? Bilateral leg edema   ? Colon polyps   ? 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 hysteroscopy D&C specimen  ? Depression   ? Fibroid   ? GERD (gastroesophageal reflux disease)   ? Herniated disc   ? Hypertension   ? no medications at this time  ? Lichen sclerosus   ? PONV (postoperative nausea and vomiting)   ? Rheumatic fever   ? in childhood  ? Sciatic leg pain   ? Vocal cord nodule   ? ? ?Past Surgical History:  ?Procedure Laterality Date  ? COLONOSCOPY    ? DILATATION & CURETTAGE/HYSTEROSCOPY WITH MYOSURE N/A 10/23/2015  ? Procedure: [place];  Surgeon: Anastasio Auerbach, MD;  Location: [facility];  Service: Gynecology;  Laterality: N/A;  ? DILATION AND CURETTAGE OF UTERUS    ? HYSTEROSCOPY    ? MYOMECTOMY  1999  ? TONSILLECTOMY    ? ? ?Current Outpatient Medications  ?Medication Sig Dispense Refill  ? acetaminophen (TYLENOL) 650 MG CR tablet Take 650 mg by mouth every 8 (eight) hours as needed for pain.    ? amitriptyline (ELAVIL) 25 MG tablet Take 25 mg by mouth at bedtime. 25-'50mg'$     ? betamethasone valerate ointment (VALISONE) 0.1 % Apply a pea sized amount topically q week, can increase to BID for up to 2 weeks as needed for a flare. 45 g 2  ? Blood Pressure Monitoring (BLOOD PRESSURE CUFF) MISC 1 Package by Does not apply route daily. 1 each 0  ? carvedilol (COREG) 6.25 MG tablet TAKE 1 TABLET(6.25 MG) BY MOUTH TWICE DAILY WITH A MEAL 180 tablet 1  ? fluconazole (DIFLUCAN) 150 MG tablet Take  one tablet po and may repeat one tablet po in 72 hours is symptoms persist. 2 tablet 0  ? levonorgestrel (MIRENA, 52 MG,) 20 MCG/24HR IUD Mirena 20 mcg/24 hours (7 yrs) 52 mg intrauterine device ? Take 1 device by intrauterine route.    ? methocarbamol (ROBAXIN) 750 MG tablet Take 750 mg by mouth daily.    ? metroNIDAZOLE (FLAGYL) 500 MG tablet Take 1 tablet (500 mg total) by mouth 2 (two) times daily. 14 tablet 0  ? misoprostol (CYTOTEC) 200 MCG tablet Place 2 tablets vaginally 6-12 hours prior to procedure. 2 tablet 0  ? omeprazole (PRILOSEC) 40 MG capsule Take 40 mg by mouth 2 (two) times daily.    ? oxybutynin (DITROPAN) 5 MG tablet Take 5 mg by mouth 2 (two) times daily.    ? polyethylene glycol (MIRALAX / GLYCOLAX) 17 g packet Take 17 g by mouth daily as needed. 14 each 0  ? spironolactone (ALDACTONE) 25 MG tablet TAKE 1 TABLET EVERY DAY 90 tablet 3  ? torsemide (DEMADEX) 20 MG tablet Take 1 tablet (20 mg total) by mouth 2 (two) times daily. 180 tablet 3  ? triamcinolone (KENALOG) 0.025 % ointment Apply a pea sized amount  topically BID for 1-2 weeks with a flare, then change to a pea sized amount topically 2 x a week. 80 g 0  ? trimethoprim (TRIMPEX) 100 MG tablet Take 100 mg by mouth daily.    ? Vitamin D, Ergocalciferol, (DRISDOL) 1.25 MG (50000 UNIT) CAPS capsule Take 50,000 Units by mouth once a week.    ? ?No current facility-administered medications for this visit.  ?  ? ?ALLERGIES: Patient has no known allergies. ? ?Family History  ?Problem Relation Age of Onset  ? Diabetes Mother   ? Hypertension Mother   ? Diabetes Father   ? Heart disease Sister   ? Cancer Sister   ?     Colon  ? Cancer Brother   ?     Stomach  ? Hypertension Maternal Grandmother   ? Diabetes Maternal Grandmother   ? Polycystic ovary syndrome Neg Hx   ? ? ?Social History  ? ?Socioeconomic History  ? Marital status: Single  ?  Spouse name: Not on file  ? Number of children: Not on file  ? Years of education: Not on file  ? Highest education level: Not on file  ?Occupational History  ? Not on file  ?Tobacco Use  ? Smoking status: Never  ? Smokeless tobacco: Never  ?Vaping Use  ? Vaping Use: Never used  ?Substance and Sexual Activity  ? Alcohol use: Yes  ?  Alcohol/week: 0.0 standard drinks  ?  Comment: occ  ? Drug use: No  ? Sexual activity: Not Currently  ?  Birth control/protection: Post-menopausal  ?  Comment: 1st intercourse 57 yo-5 partners-Mirena 2018?  ?Other Topics Concern  ? Not on file  ?Social History Narrative  ? Not on file  ? ?Social Determinants of Health  ? ?Financial Resource Strain: Not on file  ?Food Insecurity: Not on file  ?Transportation Needs: Not on file  ?Physical Activity: Not on file  ?Stress: Not on file  ?Social Connections: Not on file  ?Intimate Partner Violence: Not on file  ? ? ?ROS ? ?PHYSICAL EXAMINATION:   ? ?BP 122/68   Pulse (!) 102   Ht '5\' 9"'$  (1.753 m)   Wt 299 lb (135.6 kg)   SpO2 99%   BMI 44.15 kg/m?     ?General  appearance: alert, cooperative and appears stated age ? ? ?Pelvic: External genitalia:  no lesions ?              Urethra:  normal appearing urethra with no masses, tenderness or lesions ?             Bartholins and Skenes: normal    ?             Vagina: normal appearing vagina with normal color and discharge, no lesions ?             Cervix: no lesions, IUD string 3 cm ? ?Procedure: ? ?The risks of the IUD removal and mirena IUD insertion were reviewed with the patient, including: infection, abnormal bleeding and uterine perfortion. Consent was signed. ? ?The risks of endometrial biopsy were reviewed and a consent was obtained.  ? ?A speculum was placed in the vagina and the cervix was cleansed with betadine. A tenaculum was placed on the cervix The IUD was removed with ringed forceps. ?The endometrial pipelle was placed into the endometrial cavity. The uterus sounded to ~9 cm. The endometrial biopsy was performed, taking care to get a representative sample, sampling 360 degrees of the uterine cavity. A moderate amount of tissue/blood was obtained.  ? ?The cervix was dilated to a 5 hagar dilator ? ?The Mirena IUD was inserted without difficulty. The string were cut to 3-4 cm.   ? ?The patient tolerated the procedure  very well.  ? ?              ?Chaperone was present for exam. ? ?1. History of endometrial hyperplasia ?- IUD Insertion, mirena ?- Surgical pathology( [place]/ [facility]) ? ?2. Encounter for replacement of intrauterine contraceptive device ?- IUD removal and Insertion of a new mirena ? ?3. Postmenopausal bleeding ?- Surgical pathology( [place]/ [facility]) ? ? ?

## 2022-02-12 LAB — SURGICAL PATHOLOGY

## 2022-02-14 ENCOUNTER — Other Ambulatory Visit: Payer: Self-pay | Admitting: *Deleted

## 2022-02-14 DIAGNOSIS — N840 Polyp of corpus uteri: Secondary | ICD-10-CM

## 2022-03-12 ENCOUNTER — Encounter: Payer: Self-pay | Admitting: Obstetrics and Gynecology

## 2022-03-12 ENCOUNTER — Ambulatory Visit (INDEPENDENT_AMBULATORY_CARE_PROVIDER_SITE_OTHER): Payer: Medicare HMO | Admitting: Obstetrics and Gynecology

## 2022-03-12 VITALS — BP 128/74 | HR 82 | Ht 69.0 in | Wt 296.0 lb

## 2022-03-12 DIAGNOSIS — L900 Lichen sclerosus et atrophicus: Secondary | ICD-10-CM

## 2022-03-12 DIAGNOSIS — Z8742 Personal history of other diseases of the female genital tract: Secondary | ICD-10-CM

## 2022-03-12 DIAGNOSIS — N840 Polyp of corpus uteri: Secondary | ICD-10-CM | POA: Diagnosis not present

## 2022-03-12 DIAGNOSIS — N9089 Other specified noninflammatory disorders of vulva and perineum: Secondary | ICD-10-CM

## 2022-03-12 DIAGNOSIS — Z30431 Encounter for routine checking of intrauterine contraceptive device: Secondary | ICD-10-CM | POA: Diagnosis not present

## 2022-03-12 LAB — WET PREP FOR TRICH, YEAST, CLUE

## 2022-03-12 NOTE — Progress Notes (Signed)
GYNECOLOGY  VISIT ?  ?HPI: ?66 y.o.   Single Black or African American Not Hispanic or Latino  female   ?[id number] with No LMP recorded. Patient is postmenopausal.   ?here for IUD follow up. 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 At the time of that visit she reported spotting (s/p cytotec). An endometrial biopsy was done that returned with benign endometrial polyps.  ? ?She has a h/o vulvar vitiligo and lichen sclerosis, she has intermittent irritation. She has steroid ointment for prn use.  ? ?Patient lost her mom Saturday.  ? ?GYNECOLOGIC HISTORY: ?No LMP recorded. Patient is postmenopausal. ?Contraception:PMP  ?Menopausal hormone therapy: no, has a mirena iud for endometrial protection.  ?       ?OB History   ? ? Gravida  ?2  ? Para  ?   ? Term  ?   ? Preterm  ?   ? AB  ?2  ? Living  ?0  ?  ? ? SAB  ?   ? IAB  ?   ? Ectopic  ?   ? Multiple  ?   ? Live Births  ?   ?   ?  ?  ?    ? ?Patient Active Problem List  ? Diagnosis Date Noted  ? Lumbar herniated disc 01/21/2022  ? Elevated testosterone level 05/25/2021  ? Vocal nodules in adults 04/17/2020  ? Hoarseness 10/04/2019  ? Diastolic dysfunction 03/50/0938  ? Leg swelling   ? Morbid obesity ([place]) 06/11/2019  ? Weakness   ? Acute respiratory failure with hypoxia ([place]) 06/09/2019  ? COVID-19 virus infection 06/08/2019  ? GERD (gastroesophageal reflux disease)   ? AKI (acute kidney injury) ([place])   ? Arthritis   ? Hypertension   ? Depression   ? Fibroid   ? ? ?Past Medical History:  ?Diagnosis Date  ? Anemia   ? history  ? Arthritis   ? Osteoarthritis  ? Asthma   ? Childhood  ? Bilateral leg edema   ? Colon polyps   ? 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 hysteroscopy D&C specimen  ? Depression   ? Fibroid   ? GERD (gastroesophageal reflux disease)   ? Herniated disc   ? Hypertension   ? no medications at this time  ? Lichen sclerosus   ? PONV (postoperative nausea and  vomiting)   ? Rheumatic fever   ? in childhood  ? Sciatic leg pain   ? Vocal cord nodule   ? ? ?Past Surgical History:  ?Procedure Laterality Date  ? COLONOSCOPY    ? DILATATION & CURETTAGE/HYSTEROSCOPY WITH MYOSURE N/A 10/23/2015  ? Procedure: [place];  Surgeon: Anastasio Auerbach, MD;  Location: [facility];  Service: Gynecology;  Laterality: N/A;  ? DILATION AND CURETTAGE OF UTERUS    ? HYSTEROSCOPY    ? MYOMECTOMY  1999  ? TONSILLECTOMY    ? ? ?Current Outpatient Medications  ?Medication Sig Dispense Refill  ? acetaminophen (TYLENOL) 650 MG CR tablet Take 650 mg by mouth every 8 (eight) hours as needed for pain.    ? amitriptyline (ELAVIL) 25 MG tablet Take 25 mg by mouth at bedtime. 25-'50mg'$     ? betamethasone valerate ointment (VALISONE) 0.1 % Apply a pea sized amount topically q week, can increase to BID for up to 2 weeks as needed for a flare. 45 g 2  ? Blood Pressure  Monitoring (BLOOD PRESSURE CUFF) MISC 1 Package by Does not apply route daily. 1 each 0  ? carvedilol (COREG) 6.25 MG tablet TAKE 1 TABLET(6.25 MG) BY MOUTH TWICE DAILY WITH A MEAL 180 tablet 1  ? levonorgestrel (MIRENA, 52 MG,) 20 MCG/24HR IUD Mirena 20 mcg/24 hours (7 yrs) 52 mg intrauterine device ? Take 1 device by intrauterine route.    ? methocarbamol (ROBAXIN) 750 MG tablet Take 750 mg by mouth daily.    ? misoprostol (CYTOTEC) 200 MCG tablet Place 2 tablets vaginally 6-12 hours prior to procedure. 2 tablet 0  ? omeprazole (PRILOSEC) 40 MG capsule Take 40 mg by mouth 2 (two) times daily.    ? oxybutynin (DITROPAN) 5 MG tablet Take 5 mg by mouth 2 (two) times daily.    ? polyethylene glycol (MIRALAX / GLYCOLAX) 17 g packet Take 17 g by mouth daily as needed. 14 each 0  ? spironolactone (ALDACTONE) 25 MG tablet TAKE 1 TABLET EVERY DAY 90 tablet 3  ? triamcinolone (KENALOG) 0.025 % ointment Apply a pea sized amount topically BID for 1-2 weeks with a flare, then change to a pea sized amount topically 2 x a  week. 80 g 0  ? trimethoprim (TRIMPEX) 100 MG tablet Take 100 mg by mouth daily.    ? Vitamin D, Ergocalciferol, (DRISDOL) 1.25 MG (50000 UNIT) CAPS capsule Take 50,000 Units by mouth once a week.    ? torsemide (DEMADEX) 20 MG tablet Take 1 tablet (20 mg total) by mouth 2 (two) times daily. 180 tablet 3  ? ?No current facility-administered medications for this visit.  ?  ? ?ALLERGIES: Patient has no known allergies. ? ?Family History  ?Problem Relation Age of Onset  ? Diabetes Mother   ? Hypertension Mother   ? Diabetes Father   ? Heart disease Sister   ? Cancer Sister   ?     Colon  ? Cancer Brother   ?     Stomach  ? Hypertension Maternal Grandmother   ? Diabetes Maternal Grandmother   ? Polycystic ovary syndrome Neg Hx   ? ? ?Social History  ? ?Socioeconomic History  ? Marital status: Single  ?  Spouse name: Not on file  ? Number of children: Not on file  ? Years of education: Not on file  ? Highest education level: Not on file  ?Occupational History  ? Not on file  ?Tobacco Use  ? Smoking status: Never  ? Smokeless tobacco: Never  ?Vaping Use  ? Vaping Use: Never used  ?Substance and Sexual Activity  ? Alcohol use: Yes  ?  Alcohol/week: 0.0 standard drinks  ?  Comment: occ  ? Drug use: No  ? Sexual activity: Not Currently  ?  Birth control/protection: Post-menopausal  ?  Comment: 1st intercourse 76 yo-5 partners-Mirena 2018?  ?Other Topics Concern  ? Not on file  ?Social History Narrative  ? Not on file  ? ?Social Determinants of Health  ? ?Financial Resource Strain: Not on file  ?Food Insecurity: Not on file  ?Transportation Needs: Not on file  ?Physical Activity: Not on file  ?Stress: Not on file  ?Social Connections: Not on file  ?Intimate Partner Violence: Not on file  ? ? ?Review of Systems  ?All other systems reviewed and are negative. ? ?PHYSICAL EXAMINATION:   ? ?BP 128/74   Pulse 82   Ht '5\' 9"'$  (1.753 m)   Wt 296 lb (134.3 kg)   SpO2 99%   BMI 43.71  kg/m?     ?General appearance: alert, cooperative  and appears stated age ? ?Pelvic: External genitalia:  no lesions, diffuse vitiligo of the vulva and perianal area. 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 Looks slightly irritated ?             Urethra:  normal appearing urethra with no masses, tenderness or lesions ?             Bartholins and Skenes: normal    ?             Vagina: normal appearing vagina with normal color and discharge, no lesions ?             Cervix: no lesions and IUD string 3 cm ?             Bimanual Exam:  Uterus:   no masses or tenderness ?             Adnexa: no mass, fullness, tenderness ?              ?Chaperone was present for exam. ? ?1. IUD check up ?Doing well ? ?2. History of endometrial hyperplasia ?Mirena exchange last month ? ?3. Endometrial polyp ?On biopsy done at the time of IUD exchange for new onset spotting (after taking cytotec) ? ?4. Lichen sclerosus ?Has steroids for prn use ? ?5. Vulvar irritation ?-vulvar skin care sheet given ?- WET PREP FOR [place], YEAST, CLUE ? ?Over 20 minutes in total patient care.  ?

## 2022-03-20 ENCOUNTER — Encounter: Payer: Self-pay | Admitting: Obstetrics and Gynecology

## 2022-03-20 ENCOUNTER — Telehealth: Payer: Self-pay | Admitting: Obstetrics and Gynecology

## 2022-03-20 ENCOUNTER — Ambulatory Visit (INDEPENDENT_AMBULATORY_CARE_PROVIDER_SITE_OTHER): Payer: Medicare HMO

## 2022-03-20 ENCOUNTER — Other Ambulatory Visit: Payer: Self-pay | Admitting: Obstetrics and Gynecology

## 2022-03-20 ENCOUNTER — Ambulatory Visit (INDEPENDENT_AMBULATORY_CARE_PROVIDER_SITE_OTHER): Payer: Medicare HMO | Admitting: Obstetrics and Gynecology

## 2022-03-20 VITALS — BP 110/80 | HR 88 | Ht 69.0 in | Wt 296.0 lb

## 2022-03-20 DIAGNOSIS — N840 Polyp of corpus uteri: Secondary | ICD-10-CM

## 2022-03-20 DIAGNOSIS — Z8742 Personal history of other diseases of the female genital tract: Secondary | ICD-10-CM

## 2022-03-20 DIAGNOSIS — N882 Stricture and stenosis of cervix uteri: Secondary | ICD-10-CM

## 2022-03-20 DIAGNOSIS — I10 Essential (primary) hypertension: Secondary | ICD-10-CM

## 2022-03-20 DIAGNOSIS — I5189 Other ill-defined heart diseases: Secondary | ICD-10-CM

## 2022-03-20 DIAGNOSIS — Z6841 Body Mass Index (BMI) 40.0 and over, adult: Secondary | ICD-10-CM

## 2022-03-20 MED ORDER — MISOPROSTOL 200 MCG PO TABS: ORAL_TABLET | ORAL | 0 refills | Status: DC

## 2022-03-20 NOTE — Progress Notes (Signed)
GYNECOLOGY  VISIT ?  ?HPI: ?66 y.o.   Single Black or African American Not Hispanic or Latino  female   ?[id number] with No LMP recorded. Patient is postmenopausal.   ?here for further evaluation of PMP spotting (after taking cytotec).  ? 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 At the time of that visit she reported spotting (only s/p cytotec). An endometrial biopsy was done at the time of the IUD exchange that returned with benign endometrial polyps.  ? ?GYNECOLOGIC HISTORY: ?No LMP recorded. Patient is postmenopausal. ?Contraception:PMP ?Menopausal hormone therapy: no, has a mirena IUD for endometrial protection.  ?       ?OB History   ? ? Gravida  ?2  ? Para  ?   ? Term  ?   ? Preterm  ?   ? AB  ?2  ? Living  ?0  ?  ? ? SAB  ?   ? IAB  ?   ? Ectopic  ?   ? Multiple  ?   ? Live Births  ?   ?   ?  ?  ?    ? ?Patient Active Problem List  ? Diagnosis Date Noted  ? Lumbar herniated disc 01/21/2022  ? Elevated testosterone level 05/25/2021  ? Vocal nodules in adults 04/17/2020  ? Hoarseness 10/04/2019  ? Diastolic dysfunction 29/93/7169  ? Leg swelling   ? Morbid obesity ([place]) 06/11/2019  ? Weakness   ? Acute respiratory failure with hypoxia ([place]) 06/09/2019  ? COVID-19 virus infection 06/08/2019  ? GERD (gastroesophageal reflux disease)   ? AKI (acute kidney injury) ([place])   ? Arthritis   ? Hypertension   ? Depression   ? Fibroid   ? ? ?Past Medical History:  ?Diagnosis Date  ? Anemia   ? history  ? Arthritis   ? Osteoarthritis  ? Asthma   ? Childhood  ? Bilateral leg edema   ? Colon polyps   ? 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 hysteroscopy D&C specimen  ? Depression   ? Fibroid   ? GERD (gastroesophageal reflux disease)   ? Herniated disc   ? Hypertension   ? no medications at this time  ? Lichen sclerosus   ? PONV (postoperative nausea and vomiting)   ? Rheumatic fever   ? in childhood  ? Sciatic leg pain   ? Vocal cord nodule    ? ? ?Past Surgical History:  ?Procedure Laterality Date  ? COLONOSCOPY    ? DILATATION & CURETTAGE/HYSTEROSCOPY WITH MYOSURE N/A 10/23/2015  ? Procedure: [place];  Surgeon: Anastasio Auerbach, MD;  Location: [facility];  Service: Gynecology;  Laterality: N/A;  ? DILATION AND CURETTAGE OF UTERUS    ? HYSTEROSCOPY    ? MYOMECTOMY  1999  ? TONSILLECTOMY    ? ? ?Current Outpatient Medications  ?Medication Sig Dispense Refill  ? acetaminophen (TYLENOL) 650 MG CR tablet Take 650 mg by mouth every 8 (eight) hours as needed for pain.    ? amitriptyline (ELAVIL) 25 MG tablet Take 25 mg by mouth at bedtime. 25-'50mg'$     ? betamethasone valerate ointment (VALISONE) 0.1 % Apply a pea sized amount topically q week, can increase to BID for up to 2 weeks as needed for a flare. 45 g 2  ? Blood Pressure Monitoring (BLOOD PRESSURE CUFF) MISC 1 Package by Does not apply route daily. 1 each  0  ? carvedilol (COREG) 6.25 MG tablet TAKE 1 TABLET(6.25 MG) BY MOUTH TWICE DAILY WITH A MEAL 180 tablet 1  ? CEFDINIR PO     ? levonorgestrel (MIRENA, 52 MG,) 20 MCG/24HR IUD Mirena 20 mcg/24 hours (7 yrs) 52 mg intrauterine device ? Take 1 device by intrauterine route.    ? methocarbamol (ROBAXIN) 750 MG tablet Take 750 mg by mouth daily.    ? omeprazole (PRILOSEC) 40 MG capsule Take 40 mg by mouth 2 (two) times daily.    ? oxybutynin (DITROPAN) 5 MG tablet Take 5 mg by mouth 2 (two) times daily.    ? polyethylene glycol (MIRALAX / GLYCOLAX) 17 g packet Take 17 g by mouth daily as needed. 14 each 0  ? spironolactone (ALDACTONE) 25 MG tablet TAKE 1 TABLET EVERY DAY 90 tablet 3  ? trimethoprim (TRIMPEX) 100 MG tablet Take 100 mg by mouth daily.    ? Vitamin D, Ergocalciferol, (DRISDOL) 1.25 MG (50000 UNIT) CAPS capsule Take 50,000 Units by mouth once a week.    ? torsemide (DEMADEX) 20 MG tablet Take 1 tablet (20 mg total) by mouth 2 (two) times daily. 180 tablet 3  ? ?No current facility-administered medications  for this visit.  ?  ? ?ALLERGIES: Patient has no known allergies. ? ?Family History  ?Problem Relation Age of Onset  ? Diabetes Mother   ? Hypertension Mother   ? Diabetes Father   ? Heart disease Sister   ? Cancer Sister   ?     Colon  ? Cancer Brother   ?     Stomach  ? Hypertension Maternal Grandmother   ? Diabetes Maternal Grandmother   ? Polycystic ovary syndrome Neg Hx   ? ? ?Social History  ? ?Socioeconomic History  ? Marital status: Single  ?  Spouse name: Not on file  ? Number of children: Not on file  ? Years of education: Not on file  ? Highest education level: Not on file  ?Occupational History  ? Not on file  ?Tobacco Use  ? Smoking status: Never  ? Smokeless tobacco: Never  ?Vaping Use  ? Vaping Use: Never used  ?Substance and Sexual Activity  ? Alcohol use: Yes  ?  Alcohol/week: 0.0 standard drinks  ?  Comment: occ  ? Drug use: No  ? Sexual activity: Not Currently  ?  Birth control/protection: Post-menopausal  ?  Comment: 1st intercourse 82 yo-5 partners-Mirena 2018?  ?Other Topics Concern  ? Not on file  ?Social History Narrative  ? Not on file  ? ?Social Determinants of Health  ? ?Financial Resource Strain: Not on file  ?Food Insecurity: Not on file  ?Transportation Needs: Not on file  ?Physical Activity: Not on file  ?Stress: Not on file  ?Social Connections: Not on file  ?Intimate Partner Violence: Not on file  ? ? ?ROS ? ?PHYSICAL EXAMINATION:   ? ?BP 110/80   Pulse 88   Ht '5\' 9"'$  (1.753 m)   Wt 296 lb (134.3 kg)   SpO2 100%   BMI 43.71 kg/m?     ?General appearance: alert, cooperative and appears stated age ?Neck: no adenopathy, supple, symmetrical, trachea midline and thyroid normal to inspection and palpation ?Heart: regular rate and rhythm ?Lungs: CTAB ?Abdomen: soft, non-tender; bowel sounds normal; no masses,  no organomegaly ?Extremities: normal, atraumatic, no cyanosis ?Skin: normal color, texture and turgor, no rashes or lesions ?Lymph: normal cervical supraclavicular and inguinal  nodes ?Neurologic: grossly normal ? ?  See ultrasound report ?+polyps ? ?1. History of endometrial hyperplasia ?Now with endometrial polyps ?Plan: hysteroscopy, polypectomy, dilation and curettage. Reviewed risks, including: bleeding, infection, uterine perforation, fluid overload, need for further sugery ?Will need to remove her IUD for the procedure. Discussed possibly replacing the IUD ? ?2. Endometrial polyp ?See above ? ?3. Hypertension, unspecified type ? ?4. Diastolic dysfunction ?Will request cardiac clearance  ? ?5. BMI 40.0-44.9, adult ([place]) ? ?6. Cervical stenosis (uterine cervix) ?- misoprostol (CYTOTEC) 200 MCG tablet; Place 2 tablets vaginally the night prior to surgery  Dispense: 2 tablet; Refill: 0 ? ? ?

## 2022-03-20 NOTE — Telephone Encounter (Signed)
Surgery: CPT [phone] - Hysteroscopy/D&C/Myosure,  removal of iud, possible placement of new mirena ? ?Diagnosis: N84.0 Endometrial Polyp,  history of endometrial hyperplasia , ? ?Location: [facility] ? ?Status: Outpatient ? ?Time: 30 Minutes ? ?Assistant: N/A ? ?Urgency: First Available ? ?Pre-Op Appointment: Completed ? ?Post-Op Appointment(s): 1 Week,  ? ?Time Out Of Work: Day Of Surgery ONLY ? ?Patient just had this IUD placed, had spotting s/p cytotec the day of insertion. Biopsy showed a polyp. Now needs D&C. Her IUD had been in for 6 years (only helps for 5). ?Can we get coverage for another IUD? ?

## 2022-03-27 ENCOUNTER — Emergency Department (HOSPITAL_COMMUNITY): Payer: Medicare HMO

## 2022-03-27 ENCOUNTER — Other Ambulatory Visit: Payer: Self-pay

## 2022-03-27 ENCOUNTER — Emergency Department (HOSPITAL_COMMUNITY)
Admission: EM | Admit: 2022-03-27 | Discharge: 2022-03-27 | Disposition: A | Discharge status: Home / Self Care | Payer: Medicare HMO | Attending: Emergency Medicine | Admitting: Emergency Medicine

## 2022-03-27 ENCOUNTER — Encounter (HOSPITAL_COMMUNITY): Payer: Self-pay | Admitting: *Deleted

## 2022-03-27 DIAGNOSIS — D72829 Elevated white blood cell count, unspecified: Secondary | ICD-10-CM | POA: Diagnosis not present

## 2022-03-27 DIAGNOSIS — Z20822 Contact with and (suspected) exposure to covid-19: Secondary | ICD-10-CM | POA: Diagnosis not present

## 2022-03-27 DIAGNOSIS — R197 Diarrhea, unspecified: Secondary | ICD-10-CM | POA: Insufficient documentation

## 2022-03-27 DIAGNOSIS — J029 Acute pharyngitis, unspecified: Secondary | ICD-10-CM | POA: Insufficient documentation

## 2022-03-27 DIAGNOSIS — I10 Essential (primary) hypertension: Secondary | ICD-10-CM | POA: Insufficient documentation

## 2022-03-27 DIAGNOSIS — K0889 Other specified disorders of teeth and supporting structures: Secondary | ICD-10-CM | POA: Insufficient documentation

## 2022-03-27 DIAGNOSIS — Z79899 Other long term (current) drug therapy: Secondary | ICD-10-CM | POA: Insufficient documentation

## 2022-03-27 DIAGNOSIS — H9202 Otalgia, left ear: Secondary | ICD-10-CM | POA: Diagnosis present

## 2022-03-27 LAB — URINALYSIS, ROUTINE W REFLEX MICROSCOPIC
Bilirubin Urine: NEGATIVE
Glucose, UA: NEGATIVE mg/dL
Hgb urine dipstick: NEGATIVE
Ketones, ur: NEGATIVE mg/dL
Nitrite: NEGATIVE
Protein, ur: 30 mg/dL — AB
Specific Gravity, Urine: 1.028 (ref 1.005–1.030)
pH: 5 (ref 5.0–8.0)

## 2022-03-27 LAB — COMPREHENSIVE METABOLIC PANEL
ALT: 39 U/L (ref 0–44)
AST: 30 U/L (ref 15–41)
Albumin: 3.8 g/dL (ref 3.5–5.0)
Alkaline Phosphatase: 101 U/L (ref 38–126)
Anion gap: 9 (ref 5–15)
BUN: 15 mg/dL (ref 8–23)
CO2: 20 mmol/L — ABNORMAL LOW (ref 22–32)
Calcium: 9.6 mg/dL (ref 8.9–10.3)
Chloride: 109 mmol/L (ref 98–111)
Creatinine, Ser: 0.92 mg/dL (ref 0.44–1.00)
GFR, Estimated: >60 mL/min (ref >60)
Glucose, Bld: 111 mg/dL — ABNORMAL HIGH (ref 70–99)
Potassium: 3.6 mmol/L (ref 3.5–5.1)
Sodium: 138 mmol/L (ref 135–145)
Total Bilirubin: 0.8 mg/dL (ref 0.3–1.2)
Total Protein: 7.6 g/dL (ref 6.5–8.1)

## 2022-03-27 LAB — CBC
HCT: 44.9 % (ref 36.0–46.0)
Hemoglobin: 14.9 g/dL (ref 12.0–15.0)
MCH: 27.5 pg (ref 26.0–34.0)
MCHC: 33.2 g/dL (ref 30.0–36.0)
MCV: 83 fL (ref 80.0–100.0)
Platelets: 296 10*3/uL (ref 150–400)
RBC: 5.41 MIL/uL — ABNORMAL HIGH (ref 3.87–5.11)
RDW: 15.9 % — ABNORMAL HIGH (ref 11.5–15.5)
WBC: 11.3 10*3/uL — ABNORMAL HIGH (ref 4.0–10.5)
nRBC: 0 % (ref 0.0–0.2)

## 2022-03-27 LAB — RESP PANEL BY RT-PCR (FLU A&B, COVID) ARPGX2
Influenza A by PCR: NEGATIVE
Influenza B by PCR: NEGATIVE
SARS Coronavirus 2 by RT PCR: NEGATIVE

## 2022-03-27 LAB — LIPASE, BLOOD: Lipase: 30 U/L (ref 11–51)

## 2022-03-27 LAB — GROUP A STREP BY PCR: Group A Strep by PCR: NOT DETECTED

## 2022-03-27 MED ORDER — MORPHINE SULFATE (PF) 2 MG/ML IV SOLN
2.0000 mg | Freq: Once | INTRAVENOUS | Status: AC
  Administered 2022-03-27: 2 mg via INTRAVENOUS
  Filled 2022-03-27: qty 1

## 2022-03-27 MED ORDER — SODIUM CHLORIDE 0.9 % IV BOLUS
1000.0000 mL | Freq: Once | INTRAVENOUS | Status: AC
  Administered 2022-03-27: 1000 mL via INTRAVENOUS

## 2022-03-27 MED ORDER — KETOROLAC TROMETHAMINE 15 MG/ML IJ SOLN
15.0000 mg | Freq: Once | INTRAMUSCULAR | Status: AC
  Administered 2022-03-27: 15 mg via INTRAVENOUS
  Filled 2022-03-27: qty 1

## 2022-03-27 NOTE — ED Triage Notes (Signed)
Recent sinus infection seen for 10 days ago, also ear pain, tooth broken, emesis x 2 days ago followed by diarrhea, emesis has decreased, diarrhea has not. ?

## 2022-03-27 NOTE — ED Provider Notes (Signed)
?[facility] DEPT ?Provider Note ? ? ?CSN: [id number] ?Arrival date & time: 03/27/22  1253 ? ?  ? ?History ? ?Chief Complaint  ?Patient presents with  ? Emesis  ? Diarrhea  ? ? ?Kristen Shaffer is a 67 y.o. female with PMHx HTN, depression, GERD who presents to the ED today for multiple complaints.  ? ?Patient reports about 1 to 2 weeks ago she began having left ear pain, left sinus pressure, sore throat.  She was also experiencing "severe" constipation. She went to urgent care was diagnosed with a sinus infection discharged home with cefdinir and something for constipation however she cannot recall the name of same. She states that she finished the entire course and has a mild improvement in symptoms however they do persist. ? ?She does endorse that after starting the cefdinir she began having nausea and vomiting.  She does report that she had 1 day of vomiting which has since stopped however continues to have watery diarrhea.  She denies any specific abdominal pain. ? ?She also complains of left lower dental pain.  She states that a tooth is very sharp on the side and is cutting the bottom of her tongue.  She has been dealing with pain to the area for approximately 3 weeks, states that it is somewhat better in nature however she does not have a dentist to follow-up with.  ? ?The history is provided by the patient and medical records.  ? ?  ? ?Home Medications ?Prior to Admission medications   ?Medication Sig Start Date End Date Taking? Authorizing Provider  ?acetaminophen (TYLENOL) 650 MG CR tablet Take 650 mg by mouth every 8 (eight) hours as needed for pain.    [provider]  ?amitriptyline (ELAVIL) 25 MG tablet Take 25 mg by mouth at bedtime. 25-'50mg'$     [provider]  ?betamethasone valerate ointment (VALISONE) 0.1 % Apply a pea sized amount topically q week, can increase to BID for up to 2 weeks as needed for a flare. 01/21/22   [name], MD  ?Blood  Pressure Monitoring (BLOOD PRESSURE CUFF) MISC 1 Package by Does not apply route daily. 08/29/19   [name] Harp, MD  ?carvedilol (COREG) 6.25 MG tablet TAKE 1 TABLET(6.25 MG) BY MOUTH TWICE DAILY WITH A MEAL 01/03/22   [name] Harp, MD  ?CEFDINIR PO  03/18/22   [provider]  ?levonorgestrel (MIRENA, 52 MG,) 20 MCG/24HR IUD Mirena 20 mcg/24 hours (7 yrs) 52 mg intrauterine device ? Take 1 device by intrauterine route.    [provider]  ?methocarbamol (ROBAXIN) 750 MG tablet Take 750 mg by mouth daily.    [provider]  ?misoprostol (CYTOTEC) 200 MCG tablet Place 2 tablets vaginally the night prior to surgery 03/20/22   [name], MD  ?omeprazole (PRILOSEC) 40 MG capsule Take 40 mg by mouth 2 (two) times daily. 03/18/21   [provider]  ?oxybutynin (DITROPAN) 5 MG tablet Take 5 mg by mouth 2 (two) times daily. 06/05/21   [provider]  ?polyethylene glycol (MIRALAX / GLYCOLAX) 17 g packet Take 17 g by mouth daily as needed. 07/22/19   Thurnell Lose, MD  ?spironolactone (ALDACTONE) 25 MG tablet TAKE 1 TABLET EVERY DAY 07/30/21   [name] Harp, MD  ?torsemide (DEMADEX) 20 MG tablet Take 1 tablet (20 mg total) by mouth 2 (two) times daily. 08/24/19 06/26/21  [name] Harp, MD  ?trimethoprim (TRIMPEX) 100 MG tablet Take  100 mg by mouth daily.    [provider]  ?Vitamin D, Ergocalciferol, (DRISDOL) 1.25 MG (50000 UNIT) CAPS capsule Take 50,000 Units by mouth once a week. 04/02/21   [provider]  ?   ? ?Allergies    ?Patient has no known allergies.   ? ?Review of Systems   ?Review of Systems  ?Constitutional:  Negative for chills and fever.  ?HENT:  Positive for ear pain, sinus pressure, sinus pain and sore throat. Negative for trouble swallowing and voice change.   ?Respiratory:  Positive for cough. Negative for shortness of breath.   ?Cardiovascular:  Negative for chest pain.  ?Gastrointestinal:  Positive for diarrhea, nausea  and vomiting (no longer present). Negative for abdominal pain.  ?All other systems reviewed and are negative. ? ?Physical Exam ?Updated Vital Signs ?BP (!) 144/94 (BP Location: Right Arm)   Pulse 91   Temp 98.3 ?F (36.8 ?C) (Oral)   Resp 17   Ht '5\' 9"'$  (1.753 m)   Wt 132.5 kg   SpO2 99%   BMI 43.12 kg/m?  ? ?Physical Exam ?Vitals and nursing note reviewed.  ?Constitutional:   ?   Appearance: She is not ill-appearing or diaphoretic.  ?HENT:  ?   Head: Normocephalic and atraumatic.  ?   Right Ear: Tympanic membrane normal.  ?   Left Ear: Tympanic membrane normal.  ?   Nose:  ?   Left Sinus: Maxillary sinus tenderness and frontal sinus tenderness present.  ?   Mouth/Throat:  ?   Comments: Mild posterior oropharyngeal erythema. No exudate. Uvula midline. Phonating normally and tolerating own secretions without difficulty.  ? ?Poor dentition throughout with dental caries. No drainable abscess at this time and no concern for Ludwig's angina ?Eyes:  ?   Conjunctiva/sclera: Conjunctivae normal.  ?Cardiovascular:  ?   Rate and Rhythm: Normal rate and regular rhythm.  ?   Pulses: Normal pulses.  ?Pulmonary:  ?   Effort: Pulmonary effort is normal.  ?   Breath sounds: Normal breath sounds. No wheezing, rhonchi or rales.  ?   Comments: Speaking in full sentences without difficulty. Mild active dry cough. LCTAB.  ?Abdominal:  ?   Palpations: Abdomen is soft.  ?   Tenderness: There is no abdominal tenderness. There is no guarding or rebound.  ?Musculoskeletal:  ?   Cervical back: Neck supple.  ?Skin: ?   General: Skin is warm and dry.  ?Neurological:  ?   Mental Status: She is alert.  ? ? ?ED Results / Procedures / Treatments   ?Labs ?(all labs ordered are listed, but only abnormal results are displayed) ?Labs Reviewed  ?COMPREHENSIVE METABOLIC PANEL - Abnormal; Notable for the following components:  ?    Result Value  ? CO2 20 (*)   ? Glucose, Bld 111 (*)   ? All other components within normal limits  ?CBC - Abnormal;  Notable for the following components:  ? WBC 11.3 (*)   ? RBC 5.41 (*)   ? RDW 15.9 (*)   ? All other components within normal limits  ?GROUP A STREP BY PCR  ?RESP PANEL BY RT-PCR (FLU A&B, COVID) ARPGX2  ?C DIFFICILE QUICK SCREEN W PCR REFLEX    ?GASTROINTESTINAL PANEL BY PCR, STOOL (REPLACES STOOL CULTURE)  ?LIPASE, BLOOD  ?URINALYSIS, ROUTINE W REFLEX MICROSCOPIC  ? ? ?EKG ?EKG Interpretation ? ?Date/Time:  Thursday Mar 27 2022 13:44:50 EDT ?Ventricular Rate:  99 ?PR Interval:  158 ?QRS Duration: 131 ?QT Interval:  386 ?QTC Calculation: 493 ?R Axis:   26 ?Text Interpretation: Sinus rhythm Right bundle branch block No significant change since prior 8/20 Confirmed by Aletta Edouard [phone]) on 03/27/2022 2:00:23 PM ? ?Radiology ?No results found. ? ?Procedures ?Procedures  ? ? ?Medications Ordered in ED ?Medications  ?sodium chloride 0.9 % bolus 1,000 mL (has no administration in time range)  ? ? ?ED Course/ Medical Decision Making/ A&P ?  ?                        ?Medical Decision Making ?66 year old female who presents to the ED today with multiple complaints.  Easily treated for a sinus infection with cefdinir however her symptoms continue including left sinus pressure, left ear pain, left sore throat.  Also having a dry cough.  Also complaining of diarrhea since starting the cefdinir with 1 episode of nonbloody nonbilious emesis which has since resolved.  Has not been eating or drinking as much recently.  Also complaining of dental pain.  Does not have dentist to follow-up with. ? ?On arrival to the ED today vitals are stable.  Patient was medically screened and work-up started including labs CBC, CMP, lipase, strep test, COVID/flu, chest x-ray, EKG. ? ?CBC with a mild leukocytosis of 11,300 however improved from previous.  Hemoglobin stable.  No signs of anemia. ?CMP without electrolyte abnormalities.  Creatinine stable at 0.92.  BUN of 15.  LFTs unremarkable. ?Lipase unremarkable. ?Strep test negative ? ?COVID  flu are pending at this time.  ?She does endorse that her diarrhea started after being seen in urgent care for both sinus infection and constipation.  Was provided with medications for constipation however canno

## 2022-03-27 NOTE — ED Provider Notes (Signed)
Accepted handoff at shift change from [facility]. Please see prior provider note for more detail.  ? ?Briefly: Patient is 66 y.o.  ? ?DDX: concern for GI sample for Cdiff / GIP if able to provide stool sample ? ?Plan: Pending UA, fluid bolus ?DC with dental referral ? ?Provided some morphine, Toradol for back pain, dental pain patient reports significant relief.  Discussed patient has not had fever, abdominal pain, diarrhea since she has been here of low clinical suspicion for C. difficile discontinue stool panels at this time.  Although she does have some cervical lymphadenopathy on the left, possible early dental infection do not want to provide her with another course of antibiotics at this time given her repeat antibiotics recently, and history of recent diarrhea.  Encourage close follow-up with dentistry, over-the-counter pain medication.  Patient discharged in stable condition at this time, encouraged to rest, drink plenty of fluids.  Return precautions given. ? ? ? ?RISR ? ?EDTHIS ? ?  ?Anselmo Pickler, PA-C ?03/27/22 1853 ? ?  ?Ezequiel Essex, MD ?03/28/22 [phone] ? ?

## 2022-03-27 NOTE — Discharge Instructions (Addendum)
Please use Tylenol or ibuprofen for pain.  You may use 600 mg ibuprofen every 6 hours or 1000 mg of Tylenol every 6 hours.  You may choose to alternate between the 2.  This would be most effective.  Not to exceed 4 g of Tylenol within 24 hours.  Not to exceed 3200 mg ibuprofen 24 hours. ? ?You can use imodium as needed for diarrhea. ?

## 2022-03-27 NOTE — ED Provider Triage Note (Signed)
Emergency Medicine Provider Triage Evaluation Note ? ?Kristen Shaffer , a 66 y.o. female  was evaluated in triage.  Pt complains of a number of complaints, nausea, vomiting, diarrhea, sore throat, feeling of pain when she swallows on the left side, general malaise, dental pain.  Patient was seen and evaluated for dental pain, sore throat a few weeks ago given prescription for cefdinir which she completed but her symptoms remain.  She endorses a history of congestive heart failure, hypertension.  She denies chest pain at this time, denies significant shortness of breath but does feel that the congestion has progressed into her chest.  She denies significant fever, chills. ? ?Review of Systems  ?Positive: As above ?Negative: As above ? ?Physical Exam  ?BP (!) 144/94 (BP Location: Right Arm)   Pulse 91   Temp 98.3 ?F (36.8 ?C) (Oral)   Resp 17   Ht '5\' 9"'$  (1.753 m)   Wt 132.5 kg   SpO2 99%   BMI 43.12 kg/m?  ?Gen:   Awake, no distress   ?Resp:  Normal effort  ?MSK:   Moves extremities without difficulty  ?Other:  Poor dentition throughout oropharynx, some cervical LA, no evidence of PTA, some posterior oropharynx erythema ? ?Medical Decision Making  ?Medically screening exam initiated at 1:34 PM.  Appropriate orders placed.  Kristen Shaffer was informed that the remainder of the evaluation will be completed by another provider, this initial triage assessment does not replace that evaluation, and the importance of remaining in the ED until their evaluation is complete. ? ?Workup initiated ?  ?Anselmo Pickler, PA-C ?03/27/22 1335 ? ?

## 2022-03-28 ENCOUNTER — Other Ambulatory Visit: Payer: Self-pay | Admitting: Internal Medicine

## 2022-03-31 ENCOUNTER — Telehealth: Payer: Self-pay | Admitting: *Deleted

## 2022-03-31 LAB — C. DIFFICILE GDH AND TOXIN A/B

## 2022-03-31 LAB — TIQ-NTM

## 2022-03-31 NOTE — Telephone Encounter (Signed)
Encounter was closed.  ?See open encounter dated 03/31/22.  ?

## 2022-03-31 NOTE — Telephone Encounter (Signed)
Kristen Dom, MD routed conversation to [facility]- Gynecology Surgery 11 days ago  ? ?Kristen Dom, MD 11 days ago  ? ?Surgery: CPT [phone] - Hysteroscopy/D&C/Myosure,  removal of iud, possible placement of new mirena ?  ?Diagnosis: N84.0 Endometrial Polyp,  history of endometrial hyperplasia , ?  ?Location: [place] ?  ?Status: Outpatient ?  ?Time: 30 Minutes ?  ?Assistant: N/A ?  ?Urgency: First Available ?  ?Pre-Op Appointment: Completed ?  ?Post-Op Appointment(s): 1 Week,  ?  ?Time Out Of Work: Day Of Surgery ONLY ?  ?Patient just had this IUD placed, had spotting s/p cytotec the day of insertion. Biopsy showed a polyp. Now needs D&C. Her IUD had been in for 6 years (only helps for 5). ?Can we get coverage for another IUD?  ?  ? ?

## 2022-03-31 NOTE — Telephone Encounter (Signed)
Previous encounter was closed.  ?Routing to [employer] [facility].  ?

## 2022-03-31 NOTE — Telephone Encounter (Signed)
Call returned to patient.  ?Patient requesting to schedule surgery. Update provided to patient regarding review of benefits specifically for IUD insertion. Will proceed with scheduling surgery for 04/29/22. Patient aware business office will f/u with review of benefits. I will return call once surgery confirmed. Patient agreeable.  ? ?Surgery request sent.  ?

## 2022-04-02 NOTE — Telephone Encounter (Signed)
Spoke with patient. Surgery date request confirmed.  ?Advised surgery is scheduled for 04/29/22, [facility] at 1015. Patient aware benefits are being reviewed, business office will f/u once completed.  ?Surgery instruction sheet and hospital brochure reviewed, printed copy will be mailed.  ?Confirmed cardiologist, Dr. Gwenlyn Found.  ?Patient verbalizes understanding and is agreeable.  ? ? ?Dr. Talbert Nan -last OV 03/20/22, surgery 6/13. Per surgery request Pre-op completed. Will patient need additional pre-op?  ? ?Per review of 03/20/22 OV notes states "will request cardiac clearance", please confirm.  ? ?  ? ?

## 2022-04-08 ENCOUNTER — Telehealth: Payer: Self-pay | Admitting: Cardiovascular Disease

## 2022-04-08 NOTE — Telephone Encounter (Signed)
   Pre-operative Risk Assessment    Patient Name: Kristen Shaffer  DOB: December 04, 1955 MRN: [id number]      Request for Surgical Clearance    Procedure:   Hysteroscopy [facility] with Myosure  with Mirena IUD Exchange  Date of Surgery:  Clearance 04/29/22                                 Surgeon:  Dr. Sumner Boast Surgeon's Group or Practice Name:  [facility] Phone number:  [phone] Fax number:  [phone]   Type of Clearance Requested:   - Medical    Type of Anesthesia:  General    Additional requests/questions:  Please advise surgeon/provider what medications should be held.  Signed, Belisicia T Harris   04/08/2022, 2:16 PM

## 2022-04-08 NOTE — Telephone Encounter (Signed)
Primary Cardiologist:Jonathan Gwenlyn Found, MD  Chart reviewed as part of pre-operative protocol coverage. Because of CHRISTYANN MANOLIS past medical history and time since last visit, he/she will require a virtual visit/telephone call in order to better assess preoperative cardiovascular risk.  Pre-op covering staff: - Please contact patient, obtain consent, and schedule appointment   If applicable, this message will also be routed to pharmacy pool and/or primary cardiologist for input on holding anticoagulant/antiplatelet agent as requested below so that this information is available at time of patient's appointment.   Emmaline Life, NP-C    04/08/2022, 4:31 PM [place] [address] Office [phone] Fax [phone]

## 2022-04-08 NOTE — Telephone Encounter (Signed)
Reviewed with Dr. Talbert Nan, confirmed cardiac clearance needed.   Call placed to [facility]/ Dr. Gwenlyn Found . Cardiac clearance requested.

## 2022-04-09 NOTE — Telephone Encounter (Signed)
Left message to call Daeshaun Specht, RN at [facility], [phone].  

## 2022-04-10 ENCOUNTER — Telehealth: Payer: Self-pay | Admitting: *Deleted

## 2022-04-10 NOTE — Telephone Encounter (Signed)
Pt has been scheduled for tele pre op appt 04/16/22 @ 3 pm. Med rec and consent are done.     Patient Consent for Virtual Visit        [name] has provided verbal consent on 04/10/2022 for a virtual visit (video or telephone).   CONSENT FOR VIRTUAL VISIT FOR:  Kristen Shaffer  By participating in this virtual visit I agree to the following:  I hereby voluntarily request, consent and authorize [facility] and its employed or contracted physicians, physician assistants, nurse practitioners or other licensed health care professionals (the Practitioner), to provide me with telemedicine health care services (the "Services") as deemed necessary by the treating Practitioner. I acknowledge and consent to receive the Services by the Practitioner via telemedicine. I understand that the telemedicine visit will involve communicating with the Practitioner through live audiovisual communication technology and the disclosure of certain medical information by electronic transmission. I acknowledge that I have been given the opportunity to request an in-person assessment or other available alternative prior to the telemedicine visit and am voluntarily participating in the telemedicine visit.  I understand that I have the right to withhold or withdraw my consent to the use of telemedicine in the course of my care at any time, without affecting my right to future care or treatment, and that the Practitioner or I may terminate the telemedicine visit at any time. I understand that I have the right to inspect all information obtained and/or recorded in the course of the telemedicine visit and may receive copies of available information for a reasonable fee.  I understand that some of the potential risks of receiving the Services via telemedicine include:  Delay or interruption in medical evaluation due to technological equipment failure or disruption; Information transmitted may not be sufficient (e.g. poor  resolution of images) to allow for appropriate medical decision making by the Practitioner; and/or  In rare instances, security protocols could fail, causing a breach of personal health information.  Furthermore, I acknowledge that it is my responsibility to provide information about my medical history, conditions and care that is complete and accurate to the best of my ability. I acknowledge that Practitioner's advice, recommendations, and/or decision may be based on factors not within their control, such as incomplete or inaccurate data provided by me or distortions of diagnostic images or specimens that may result from electronic transmissions. I understand that the practice of medicine is not an exact science and that Practitioner makes no warranties or guarantees regarding treatment outcomes. I acknowledge that a copy of this consent can be made available to me via my patient portal ([facility]), or I can request a printed copy by calling the office of [facility].    I understand that my insurance will be billed for this visit.   I have read or had this consent read to me. I understand the contents of this consent, which adequately explains the benefits and risks of the Services being provided via telemedicine.  I have been provided ample opportunity to ask questions regarding this consent and the Services and have had my questions answered to my satisfaction. I give my informed consent for the services to be provided through the use of telemedicine in my medical care

## 2022-04-10 NOTE — Telephone Encounter (Signed)
Pt has been scheduled for tele pre op appt 04/16/22 @ 3 pm. Med rec and consent are done.

## 2022-04-10 NOTE — Telephone Encounter (Signed)
Spoke with patient.  Pre-op scheduled for 04/16/22.  Advised patient cardiology will be contacting her to schedule for cardiac clearance.  Benefits still being followed up on regarding IUD.  Patient verbalizes understanding and is agreeable.   Routing to [employer] to f/u on benefits.

## 2022-04-11 NOTE — Telephone Encounter (Signed)
Call to patient. Per DPR, OK to leave message on voicemail.   Left voicemail requesting a return call to review benefits for Scheduled Surgery with Jill Jertson, MD.  

## 2022-04-15 ENCOUNTER — Telehealth: Payer: Self-pay | Admitting: *Deleted

## 2022-04-15 NOTE — Telephone Encounter (Signed)
Patient called scheduled with Dr.Berry tomorrow at 3:00pm for tele-visit for cardiology clearance for D&C on 04/29/22. Patient asked if tele-visit tomorrow would be okay verses in person visit. Patient had EKG on 03/27/22 at ER. I explained to patient it appears Sharee Pimple put in the phone call for appointment to be scheduled with Dr.Berry and I was not aware of how Dr.Berry office schedules pre-op appointments and he may ask questions and schedule an in person visit later based on tele-visit questions.  Patient is also scheduled tomorrow at 11:30am to see you for pre-op.   Just FYI

## 2022-04-16 ENCOUNTER — Encounter: Payer: Self-pay | Admitting: Obstetrics and Gynecology

## 2022-04-16 ENCOUNTER — Ambulatory Visit (INDEPENDENT_AMBULATORY_CARE_PROVIDER_SITE_OTHER): Payer: Medicare HMO | Admitting: Nurse Practitioner

## 2022-04-16 ENCOUNTER — Ambulatory Visit (INDEPENDENT_AMBULATORY_CARE_PROVIDER_SITE_OTHER): Payer: Medicare HMO | Admitting: Obstetrics and Gynecology

## 2022-04-16 VITALS — BP 130/72 | HR 72 | Wt 288.0 lb

## 2022-04-16 DIAGNOSIS — I10 Essential (primary) hypertension: Secondary | ICD-10-CM

## 2022-04-16 DIAGNOSIS — N8500 Endometrial hyperplasia, unspecified: Secondary | ICD-10-CM

## 2022-04-16 DIAGNOSIS — N882 Stricture and stenosis of cervix uteri: Secondary | ICD-10-CM | POA: Diagnosis not present

## 2022-04-16 DIAGNOSIS — N840 Polyp of corpus uteri: Secondary | ICD-10-CM | POA: Diagnosis not present

## 2022-04-16 DIAGNOSIS — D251 Intramural leiomyoma of uterus: Secondary | ICD-10-CM

## 2022-04-16 DIAGNOSIS — Z8742 Personal history of other diseases of the female genital tract: Secondary | ICD-10-CM

## 2022-04-16 DIAGNOSIS — Z01810 Encounter for preprocedural cardiovascular examination: Secondary | ICD-10-CM

## 2022-04-16 DIAGNOSIS — I5189 Other ill-defined heart diseases: Secondary | ICD-10-CM

## 2022-04-16 DIAGNOSIS — Z6841 Body Mass Index (BMI) 40.0 and over, adult: Secondary | ICD-10-CM

## 2022-04-16 NOTE — Progress Notes (Signed)
However   Virtual Visit via Telephone Note   Because of Kristen Shaffer co-morbid illnesses, she is at least at moderate risk for complications without adequate follow up.  This format is felt to be most appropriate for this patient at this time.  The patient did not have access to video technology/had technical difficulties with video requiring transitioning to audio format only (telephone).  All issues noted in this document were discussed and addressed.  No physical exam could be performed with this format.  Please refer to the patient's chart for her consent to telehealth for [facility].  Evaluation Performed:  Preoperative cardiovascular risk assessment _____________   Date:  04/16/2022   Patient ID:  Kristen Shaffer, DOB 10/08/1956, MRN [id number] Patient Location:  Home Provider location:   Office  Primary Care Provider:  Nolene Ebbs, MD Primary Cardiologist:  Quay Burow, MD  Chief Complaint / Patient Profile   66 y.o. y/o female with a h/o HTN, diastolic dysfunction, GERD, chronic pain, who is pending hysteroscopy D&C with MyoSure with Mirena IUD exchange and presents today for telephonic preoperative cardiovascular risk assessment.  Past Medical History    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   hysteroscopy D&C specimen   Depression    Fibroid    GERD (gastroesophageal reflux disease)    Herniated disc    Hypertension    no medications at this time   Lichen sclerosus    PONV (postoperative nausea and vomiting)    Rheumatic fever    in childhood   Sciatic leg pain    Vocal cord nodule    Past Surgical History:  Procedure Laterality Date   COLONOSCOPY     DILATATION & CURETTAGE/HYSTEROSCOPY WITH MYOSURE N/A 10/23/2015   Procedure: DILATATION & CURETTAGE/HYSTEROSCOPY WITH MYOSURE;   Surgeon: Anastasio Auerbach, MD;  Location: [facility];  Service: Gynecology;  Laterality: N/A;   DILATION AND CURETTAGE OF UTERUS     HYSTEROSCOPY     MYOMECTOMY  1999   TONSILLECTOMY      Allergies  No Known Allergies  History of Present Illness    Kristen Shaffer is a 66 y.o. female who presents via audio/video conferencing for a telehealth visit today.  Pt was last seen in cardiology clinic on 06/26/2021 by Dr. Gwenlyn Found.  At that time Kristen Shaffer was doing well she was recovering from hospitalization for UTI, lower extremity edema, and positive COVID. Patient's weight had remained stable during last appointment. The patient is now pending procedure as outlined above. Since her last visit, she she has been doing well she did have a allergic reaction to a medication that caused her to have nausea vomiting and diarrhea.  She was recently seen in the ED for a sinus infection and dehydration that has since resolved.  She does suffer from chronic pain however she states that she is able to move around better and no longer uses a walker.  She is using a cane for ambulation but is able to complete ADLs independently.  She denies any two-pillow orthopnea and states that her Lasix is working and has good response following doses.  Home Medications    Prior to Admission medications   Medication Sig Start Date End Date Taking? Authorizing Provider  acetaminophen (TYLENOL) 650 MG CR tablet Take 650 mg by mouth every 8 (eight) hours as needed for pain.    [provider]  amitriptyline (ELAVIL) 25 MG tablet Take 25 mg by mouth at bedtime. 25-'50mg'$  Patient not taking: Reported on 04/10/2022    [provider]  betamethasone valerate ointment (VALISONE) 0.1 % Apply a pea sized amount topically q week, can increase to BID for up to 2 weeks as needed for a flare. 01/21/22   [name], MD  Blood Pressure Monitoring (BLOOD PRESSURE CUFF) MISC 1 Package by Does not apply route daily.  08/29/19   [name] Harp, MD  carvedilol (COREG) 6.25 MG tablet TAKE 1 TABLET(6.25 MG) BY MOUTH TWICE DAILY WITH A MEAL 01/03/22   [name] Harp, MD  CEFDINIR PO  03/18/22   [provider]  levonorgestrel (MIRENA, 52 MG,) 20 MCG/24HR IUD Mirena 20 mcg/24 hours (7 yrs) 52 mg intrauterine device  Take 1 device by intrauterine route.    [provider]  methocarbamol (ROBAXIN) 750 MG tablet Take 750 mg by mouth every 6 (six) hours as needed.    [provider]  misoprostol (CYTOTEC) 200 MCG tablet Place 2 tablets vaginally the night prior to surgery 03/20/22   [name], MD  omeprazole (PRILOSEC) 40 MG capsule Take 40 mg by mouth 2 (two) times daily. 03/18/21   [provider]  oxybutynin (DITROPAN) 5 MG tablet Take 5 mg by mouth 2 (two) times daily. 06/05/21   [provider]  OZEMPIC, 0.25 OR 0.5 MG/DOSE, 2 MG/3ML SOPN Inject into the skin. 03/31/22   [provider]  polyethylene glycol (MIRALAX / GLYCOLAX) 17 g packet Take 17 g by mouth daily as needed. Patient not taking: Reported on 04/10/2022 07/22/19   Thurnell Lose, MD  spironolactone (ALDACTONE) 25 MG tablet TAKE 1 TABLET EVERY DAY 07/30/21   [name] Harp, MD  torsemide (DEMADEX) 20 MG tablet Take 1 tablet (20 mg total) by mouth 2 (two) times daily. 08/24/19 04/10/22  [name] Harp, MD  trimethoprim (TRIMPEX) 100 MG tablet Take 100 mg by mouth daily.    [provider]  Vitamin D, Ergocalciferol, (DRISDOL) 1.25 MG (50000 UNIT) CAPS capsule Take 50,000 Units by mouth once a week. Patient not taking: Reported on 04/10/2022 04/02/21   [provider]    Physical Exam    Vital Signs:  Kristen Shaffer does not have vital signs available for review today.  130/72 with heart rate of 72  Given telephonic nature of communication, physical exam is limited. AAOx3. NAD. Normal affect.  Speech and respirations are unlabored.  Accessory Clinical Findings     None  Assessment & Plan    1.  Preoperative Cardiovascular Risk Assessment:  [id number]} Kristen Shaffer's perioperative risk of a major cardiac event is 0.9% according to the Revised Cardiac Risk Index (RCRI).  Therefore, she is at low risk for perioperative complications.   Her functional capacity is fair at 5.04 METs according to the Duke Activity Status Index (DASI).  The patient affirms she has been doing well without any new cardiac symptoms. They are able to achieve 5 METS without cardiac limitations. Therefore, based on ACC/AHA guidelines, the patient would be at acceptable risk for the planned procedure without further cardiovascular testing. The patient was advised that if she develops new symptoms prior to surgery to contact our office to arrange for a follow-up visit, and she verbalized understanding.  Recommendations: According to  ACC/AHA guidelines, no further cardiovascular testing needed.  The patient may proceed to surgery at acceptable risk.   Antiplatelet and/or Anticoagulation Recommendations: Not applicable   A copy of this note will be routed to requesting surgeon.  Time:   Today, I have spent a less amount minutes with the patient with telehealth technology discussing medical history, symptoms, and management plan.     Mable Fill, Marissa Nestle, NP  04/16/2022, 7:17 AM

## 2022-04-16 NOTE — H&P (View-Only) (Signed)
GYNECOLOGY  VISIT   HPI: 66 y.o.   Single Black or African American Not Hispanic or Latino  female   [phone] with No LMP recorded. Patient is postmenopausal.   here for a preoperative visit.   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 At the time of that visit she reported spotting (only s/p cytotec). An endometrial biopsy was done at the time of the IUD exchange that returned with benign endometrial polyps.  Ultrasound was done on 03/20/22 fluid was noted in the endometrial cavity, multiple echogenic areas within the cavity were concerning for polyps. IUD was in proper location. Multiple small intramural fibroids were noted.  Labs 03/27/22 Creatinine 0.92, GFR >60 LFT's normal Hgb 14.9  GYNECOLOGIC HISTORY: No LMP recorded. Patient is postmenopausal. Contraception:PMP Menopausal hormone therapy: none         OB History     Gravida  2   Para      Term      Preterm      AB  2   Living  0      SAB      IAB      Ectopic      Multiple      Live Births                 Patient Active Problem List   Diagnosis Date Noted   Lumbar herniated disc 01/21/2022   Elevated testosterone level 05/25/2021   Vocal nodules in adults 04/17/2020   Hoarseness 85/92/9244   Diastolic dysfunction 62/86/3817   Leg swelling    Morbid obesity ([place]) 06/11/2019   Weakness    Acute respiratory failure with hypoxia ([place]) 06/09/2019   COVID-19 virus infection 06/08/2019   GERD (gastroesophageal reflux disease)    AKI (acute kidney injury) ([place])    Arthritis    Hypertension    Depression    Fibroid     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   hysteroscopy D&C specimen   Depression    Fibroid    GERD (gastroesophageal reflux disease)     Herniated disc    Hypertension    no medications at this time   Lichen sclerosus    PONV (postoperative nausea and vomiting)    Rheumatic fever    in childhood   Sciatic leg pain    Vocal cord nodule     Past Surgical History:  Procedure Laterality Date   COLONOSCOPY     DILATATION & CURETTAGE/HYSTEROSCOPY WITH MYOSURE N/A 10/23/2015   Procedure: DILATATION & CURETTAGE/HYSTEROSCOPY WITH MYOSURE;  Surgeon: Anastasio Auerbach, MD;  Location: [facility];  Service: Gynecology;  Laterality: N/A;   DILATION AND CURETTAGE OF UTERUS     HYSTEROSCOPY     MYOMECTOMY  1999   TONSILLECTOMY      Current Outpatient Medications  Medication Sig Dispense Refill   acetaminophen (TYLENOL) 650 MG CR tablet Take 650 mg by mouth every 8 (eight) hours as needed for pain.     amitriptyline (ELAVIL) 25 MG tablet Take 25 mg by mouth at bedtime. 25-'50mg'$  (Patient not taking: Reported on 04/10/2022)     betamethasone valerate ointment (VALISONE) 0.1 % Apply a pea sized amount topically  q week, can increase to BID for up to 2 weeks as needed for a flare. 45 g 2   Blood Pressure Monitoring (BLOOD PRESSURE CUFF) MISC 1 Package by Does not apply route daily. 1 each 0   carvedilol (COREG) 6.25 MG tablet TAKE 1 TABLET(6.25 MG) BY MOUTH TWICE DAILY WITH A MEAL 180 tablet 1   CEFDINIR PO  (Patient not taking: Reported on 04/10/2022)     levonorgestrel (MIRENA, 52 MG,) 20 MCG/24HR IUD Mirena 20 mcg/24 hours (7 yrs) 52 mg intrauterine device  Take 1 device by intrauterine route.     methocarbamol (ROBAXIN) 750 MG tablet Take 750 mg by mouth every 6 (six) hours as needed.     misoprostol (CYTOTEC) 200 MCG tablet Place 2 tablets vaginally the night prior to surgery 2 tablet 0   omeprazole (PRILOSEC) 40 MG capsule Take 40 mg by mouth 2 (two) times daily.     oxybutynin (DITROPAN) 5 MG tablet Take 5 mg by mouth 2 (two) times daily.     OZEMPIC, 0.25 OR 0.5 MG/DOSE, 2 MG/3ML SOPN Inject into the skin.     polyethylene glycol  (MIRALAX / GLYCOLAX) 17 g packet Take 17 g by mouth daily as needed. (Patient not taking: Reported on 04/10/2022) 14 each 0   spironolactone (ALDACTONE) 25 MG tablet TAKE 1 TABLET EVERY DAY 90 tablet 3   torsemide (DEMADEX) 20 MG tablet Take 1 tablet (20 mg total) by mouth 2 (two) times daily. 180 tablet 3   trimethoprim (TRIMPEX) 100 MG tablet Take 100 mg by mouth daily.     Vitamin D, Ergocalciferol, (DRISDOL) 1.25 MG (50000 UNIT) CAPS capsule Take 50,000 Units by mouth once a week. (Patient not taking: Reported on 04/10/2022)     No current facility-administered medications for this visit.     ALLERGIES: Patient has no known allergies.  Family History  Problem Relation Age of Onset   Diabetes Mother    Hypertension Mother    Diabetes Father    Heart disease Sister    Cancer Sister        Colon   Cancer Brother        Stomach   Hypertension Maternal Grandmother    Diabetes Maternal Grandmother    Polycystic ovary syndrome Neg Hx     Social History   Socioeconomic History   Marital status: Single    Spouse name: Not on file   Number of children: Not on file   Years of education: Not on file   Highest education level: Not on file  Occupational History   Not on file  Tobacco Use   Smoking status: Never   Smokeless tobacco: Never  Vaping Use   Vaping Use: Never used  Substance and Sexual Activity   Alcohol use: Yes    Alcohol/week: 0.0 standard drinks    Comment: occ   Drug use: No   Sexual activity: Not Currently    Birth control/protection: Post-menopausal    Comment: 1st intercourse 32 yo-5 partners-Mirena 2018?  Other Topics Concern   Not on file  Social History Narrative   Not on file   Social Determinants of Health   Financial Resource Strain: Not on file  Food Insecurity: Not on file  Transportation Needs: Not on file  Physical Activity: Not on file  Stress: Not on file  Social Connections: Not on file  Intimate Partner Violence: Not on file     ROS  PHYSICAL EXAMINATION:    There were no  vitals taken for this visit.    General appearance: alert, cooperative and appears stated age Neck: no adenopathy, supple, symmetrical, trachea midline and thyroid normal to inspection and palpation Heart: regular rate and rhythm Lungs: CTAB Abdomen: soft, non-tender; bowel sounds normal; no masses,  no organomegaly Extremities: normal, atraumatic, no cyanosis Skin: normal color, texture and turgor, no rashes or lesions Lymph: normal cervical supraclavicular and inguinal nodes Neurologic: grossly normal   1. History of endometrial hyperplasia Now with endometrial polyps Plan: hysteroscopy, polypectomy, dilation and curettage. Reviewed risks, including: bleeding, infection, uterine perforation, fluid overload, need for further sugery Will need to remove her IUD for the procedure. Discussed possibly replacing the IUD  2. Endometrial polyp See above  3. Hypertension, unspecified type  4. Diastolic dysfunction She has appointment with Cardiology today for preoperative clearance  5. BMI 40.0-44.9, adult ([place])  6. Cervical stenosis (uterine cervix) She has a script for cytotec to take prior to surgery

## 2022-04-16 NOTE — Progress Notes (Signed)
GYNECOLOGY  VISIT   HPI: 66 y.o.   Single Black or African American Not Hispanic or Latino  female   [phone] with No LMP recorded. Patient is postmenopausal.   here for a preoperative visit.   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 At the time of that visit she reported spotting (only s/p cytotec). An endometrial biopsy was done at the time of the IUD exchange that returned with benign endometrial polyps.  Ultrasound was done on 03/20/22 fluid was noted in the endometrial cavity, multiple echogenic areas within the cavity were concerning for polyps. IUD was in proper location. Multiple small intramural fibroids were noted.  Labs 03/27/22 Creatinine 0.92, GFR >60 LFT's normal Hgb 14.9  GYNECOLOGIC HISTORY: No LMP recorded. Patient is postmenopausal. Contraception:PMP Menopausal hormone therapy: none         OB History     Gravida  2   Para      Term      Preterm      AB  2   Living  0      SAB      IAB      Ectopic      Multiple      Live Births                 Patient Active Problem List   Diagnosis Date Noted   Lumbar herniated disc 01/21/2022   Elevated testosterone level 05/25/2021   Vocal nodules in adults 04/17/2020   Hoarseness 39/76/7341   Diastolic dysfunction 93/79/0240   Leg swelling    Morbid obesity ([place]) 06/11/2019   Weakness    Acute respiratory failure with hypoxia ([place]) 06/09/2019   COVID-19 virus infection 06/08/2019   GERD (gastroesophageal reflux disease)    AKI (acute kidney injury) ([place])    Arthritis    Hypertension    Depression    Fibroid     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   hysteroscopy D&C specimen   Depression    Fibroid    GERD (gastroesophageal reflux disease)     Herniated disc    Hypertension    no medications at this time   Lichen sclerosus    PONV (postoperative nausea and vomiting)    Rheumatic fever    in childhood   Sciatic leg pain    Vocal cord nodule     Past Surgical History:  Procedure Laterality Date   COLONOSCOPY     DILATATION & CURETTAGE/HYSTEROSCOPY WITH MYOSURE N/A 10/23/2015   Procedure: DILATATION & CURETTAGE/HYSTEROSCOPY WITH MYOSURE;  Surgeon: Anastasio Auerbach, MD;  Location: [facility];  Service: Gynecology;  Laterality: N/A;   DILATION AND CURETTAGE OF UTERUS     HYSTEROSCOPY     MYOMECTOMY  1999   TONSILLECTOMY      Current Outpatient Medications  Medication Sig Dispense Refill   acetaminophen (TYLENOL) 650 MG CR tablet Take 650 mg by mouth every 8 (eight) hours as needed for pain.     amitriptyline (ELAVIL) 25 MG tablet Take 25 mg by mouth at bedtime. 25-'50mg'$  (Patient not taking: Reported on 04/10/2022)     betamethasone valerate ointment (VALISONE) 0.1 % Apply a pea sized amount topically  q week, can increase to BID for up to 2 weeks as needed for a flare. 45 g 2   Blood Pressure Monitoring (BLOOD PRESSURE CUFF) MISC 1 Package by Does not apply route daily. 1 each 0   carvedilol (COREG) 6.25 MG tablet TAKE 1 TABLET(6.25 MG) BY MOUTH TWICE DAILY WITH A MEAL 180 tablet 1   CEFDINIR PO  (Patient not taking: Reported on 04/10/2022)     levonorgestrel (MIRENA, 52 MG,) 20 MCG/24HR IUD Mirena 20 mcg/24 hours (7 yrs) 52 mg intrauterine device  Take 1 device by intrauterine route.     methocarbamol (ROBAXIN) 750 MG tablet Take 750 mg by mouth every 6 (six) hours as needed.     misoprostol (CYTOTEC) 200 MCG tablet Place 2 tablets vaginally the night prior to surgery 2 tablet 0   omeprazole (PRILOSEC) 40 MG capsule Take 40 mg by mouth 2 (two) times daily.     oxybutynin (DITROPAN) 5 MG tablet Take 5 mg by mouth 2 (two) times daily.     OZEMPIC, 0.25 OR 0.5 MG/DOSE, 2 MG/3ML SOPN Inject into the skin.     polyethylene glycol  (MIRALAX / GLYCOLAX) 17 g packet Take 17 g by mouth daily as needed. (Patient not taking: Reported on 04/10/2022) 14 each 0   spironolactone (ALDACTONE) 25 MG tablet TAKE 1 TABLET EVERY DAY 90 tablet 3   torsemide (DEMADEX) 20 MG tablet Take 1 tablet (20 mg total) by mouth 2 (two) times daily. 180 tablet 3   trimethoprim (TRIMPEX) 100 MG tablet Take 100 mg by mouth daily.     Vitamin D, Ergocalciferol, (DRISDOL) 1.25 MG (50000 UNIT) CAPS capsule Take 50,000 Units by mouth once a week. (Patient not taking: Reported on 04/10/2022)     No current facility-administered medications for this visit.     ALLERGIES: Patient has no known allergies.  Family History  Problem Relation Age of Onset   Diabetes Mother    Hypertension Mother    Diabetes Father    Heart disease Sister    Cancer Sister        Colon   Cancer Brother        Stomach   Hypertension Maternal Grandmother    Diabetes Maternal Grandmother    Polycystic ovary syndrome Neg Hx     Social History   Socioeconomic History   Marital status: Single    Spouse name: Not on file   Number of children: Not on file   Years of education: Not on file   Highest education level: Not on file  Occupational History   Not on file  Tobacco Use   Smoking status: Never   Smokeless tobacco: Never  Vaping Use   Vaping Use: Never used  Substance and Sexual Activity   Alcohol use: Yes    Alcohol/week: 0.0 standard drinks    Comment: occ   Drug use: No   Sexual activity: Not Currently    Birth control/protection: Post-menopausal    Comment: 1st intercourse 89 yo-5 partners-Mirena 2018?  Other Topics Concern   Not on file  Social History Narrative   Not on file   Social Determinants of Health   Financial Resource Strain: Not on file  Food Insecurity: Not on file  Transportation Needs: Not on file  Physical Activity: Not on file  Stress: Not on file  Social Connections: Not on file  Intimate Partner Violence: Not on file     ROS  PHYSICAL EXAMINATION:    There were no  vitals taken for this visit.    General appearance: alert, cooperative and appears stated age Neck: no adenopathy, supple, symmetrical, trachea midline and thyroid normal to inspection and palpation Heart: regular rate and rhythm Lungs: CTAB Abdomen: soft, non-tender; bowel sounds normal; no masses,  no organomegaly Extremities: normal, atraumatic, no cyanosis Skin: normal color, texture and turgor, no rashes or lesions Lymph: normal cervical supraclavicular and inguinal nodes Neurologic: grossly normal   1. History of endometrial hyperplasia Now with endometrial polyps Plan: hysteroscopy, polypectomy, dilation and curettage. Reviewed risks, including: bleeding, infection, uterine perforation, fluid overload, need for further sugery Will need to remove her IUD for the procedure. Discussed possibly replacing the IUD  2. Endometrial polyp See above  3. Hypertension, unspecified type  4. Diastolic dysfunction She has appointment with Cardiology today for preoperative clearance  5. BMI 40.0-44.9, adult ([place])  6. Cervical stenosis (uterine cervix) She has a script for cytotec to take prior to surgery

## 2022-04-17 NOTE — Telephone Encounter (Signed)
Cardiac clearance received, dated 04/16/22.  See Cardiology note in [place].   Routing to Dr. Rosann Auerbach.

## 2022-04-17 NOTE — Telephone Encounter (Signed)
Spoke with patient regarding surgery benefits. Patient acknowledges understanding of information presented. Patient is aware that benefits presented are professional benefits only. Patient is aware the hospital will call with facility benefits. See account note.    

## 2022-04-18 ENCOUNTER — Other Ambulatory Visit: Payer: Self-pay | Admitting: Internal Medicine

## 2022-04-18 ENCOUNTER — Telehealth: Payer: Self-pay

## 2022-04-18 ENCOUNTER — Encounter (HOSPITAL_BASED_OUTPATIENT_CLINIC_OR_DEPARTMENT_OTHER): Payer: Self-pay | Admitting: Obstetrics and Gynecology

## 2022-04-18 DIAGNOSIS — Z1231 Encounter for screening mammogram for malignant neoplasm of breast: Secondary | ICD-10-CM

## 2022-04-18 NOTE — Telephone Encounter (Signed)
Pt is scheduled for D&C on 04/29/22. Pt calling to inquire why doing D&C vs. Hysterectomy? She states she doesn't recall if you guys had addressed this already or not and if you did she doesn't recall the reason why. Asked specifically if it was due to her obesity? Please advise.

## 2022-04-18 NOTE — Telephone Encounter (Signed)
While it's possible she could end up with a hysterectomy in the future (including if she had abnormal pathology from the [facility] it's not the recommended treatment for polyps. It's a much bigger surgery with more risk factors. Her weight increases her risk with major surgery, but it wouldn't prevent me from doing an indicated hysterectomy.

## 2022-04-18 NOTE — Telephone Encounter (Signed)
Pt notified and voiced understanding 

## 2022-04-21 ENCOUNTER — Encounter (HOSPITAL_BASED_OUTPATIENT_CLINIC_OR_DEPARTMENT_OTHER): Payer: Self-pay | Admitting: Obstetrics and Gynecology

## 2022-04-22 ENCOUNTER — Encounter (HOSPITAL_BASED_OUTPATIENT_CLINIC_OR_DEPARTMENT_OTHER): Payer: Self-pay | Admitting: Obstetrics and Gynecology

## 2022-04-22 NOTE — Progress Notes (Addendum)
Spoke w/ via phone for pre-op interview--- pt Lab needs dos----  [employer] results------ current ekg in epic/ chart COVID test -----patient states asymptomatic no test needed Arrive at ------- 0815 on 04-29-2022 NPO after MN NO Solid Food.  Clear liquids from MN until--- 0715 Med rec completed Medications to take morning of surgery ----- coreg, prilosec, ditropan, trimethoprim Diabetic medication ----- n/a Patient instructed no nail polish to be worn day of surgery Patient instructed to bring photo id and insurance card day of surgery Patient aware to have Driver (ride ) / caregiver for 24 hours after surgery --- daughter, melanie Patient Special Instructions ----- n/a Pre-Op special Istructions ----- pt has telephone pre-op cardiac clearance note by Ambrose Pancoast NP on 04-16-2022 in epic/ chart Patient verbalized understanding of instructions that were given at this phone interview. Patient denies shortness of breath, chest pain, fever, cough at this phone interview.

## 2022-04-29 ENCOUNTER — Other Ambulatory Visit: Payer: Self-pay

## 2022-04-29 ENCOUNTER — Ambulatory Visit (HOSPITAL_BASED_OUTPATIENT_CLINIC_OR_DEPARTMENT_OTHER): Payer: Medicare HMO | Admitting: Anesthesiology

## 2022-04-29 ENCOUNTER — Ambulatory Visit (HOSPITAL_BASED_OUTPATIENT_CLINIC_OR_DEPARTMENT_OTHER)
Admission: RE | Admit: 2022-04-29 | Discharge: 2022-04-29 | Disposition: A | Discharge status: Home / Self Care | Payer: Medicare HMO | Source: Ambulatory Visit | Attending: Obstetrics and Gynecology | Admitting: Obstetrics and Gynecology

## 2022-04-29 ENCOUNTER — Encounter (HOSPITAL_BASED_OUTPATIENT_CLINIC_OR_DEPARTMENT_OTHER): Payer: Self-pay | Admitting: Obstetrics and Gynecology

## 2022-04-29 ENCOUNTER — Encounter (HOSPITAL_BASED_OUTPATIENT_CLINIC_OR_DEPARTMENT_OTHER)
Admission: RE | Disposition: A | Discharge status: Home / Self Care | Payer: Self-pay | Source: Ambulatory Visit | Attending: Obstetrics and Gynecology

## 2022-04-29 DIAGNOSIS — Z308 Encounter for other contraceptive management: Secondary | ICD-10-CM | POA: Insufficient documentation

## 2022-04-29 DIAGNOSIS — D259 Leiomyoma of uterus, unspecified: Secondary | ICD-10-CM | POA: Diagnosis not present

## 2022-04-29 DIAGNOSIS — I10 Essential (primary) hypertension: Secondary | ICD-10-CM | POA: Diagnosis not present

## 2022-04-29 DIAGNOSIS — D251 Intramural leiomyoma of uterus: Secondary | ICD-10-CM | POA: Insufficient documentation

## 2022-04-29 DIAGNOSIS — N950 Postmenopausal bleeding: Secondary | ICD-10-CM | POA: Diagnosis not present

## 2022-04-29 DIAGNOSIS — S3141XA Laceration without foreign body of vagina and vulva, initial encounter: Secondary | ICD-10-CM | POA: Diagnosis not present

## 2022-04-29 DIAGNOSIS — F418 Other specified anxiety disorders: Secondary | ICD-10-CM

## 2022-04-29 DIAGNOSIS — Z6841 Body Mass Index (BMI) 40.0 and over, adult: Secondary | ICD-10-CM | POA: Diagnosis not present

## 2022-04-29 DIAGNOSIS — N959 Unspecified menopausal and perimenopausal disorder: Secondary | ICD-10-CM | POA: Diagnosis present

## 2022-04-29 DIAGNOSIS — G4730 Sleep apnea, unspecified: Secondary | ICD-10-CM | POA: Insufficient documentation

## 2022-04-29 DIAGNOSIS — N882 Stricture and stenosis of cervix uteri: Secondary | ICD-10-CM | POA: Insufficient documentation

## 2022-04-29 DIAGNOSIS — K219 Gastro-esophageal reflux disease without esophagitis: Secondary | ICD-10-CM | POA: Insufficient documentation

## 2022-04-29 DIAGNOSIS — N858 Other specified noninflammatory disorders of uterus: Secondary | ICD-10-CM | POA: Diagnosis not present

## 2022-04-29 DIAGNOSIS — Z8742 Personal history of other diseases of the female genital tract: Secondary | ICD-10-CM | POA: Diagnosis not present

## 2022-04-29 DIAGNOSIS — N840 Polyp of corpus uteri: Secondary | ICD-10-CM | POA: Diagnosis not present

## 2022-04-29 DIAGNOSIS — Z01818 Encounter for other preprocedural examination: Secondary | ICD-10-CM

## 2022-04-29 DIAGNOSIS — N289 Disorder of kidney and ureter, unspecified: Secondary | ICD-10-CM | POA: Diagnosis not present

## 2022-04-29 HISTORY — DX: Obstructive sleep apnea (adult) (pediatric): G47.33

## 2022-04-29 HISTORY — DX: Presence of spectacles and contact lenses: Z97.3

## 2022-04-29 HISTORY — DX: Personal history of other diseases of the respiratory system: Z87.09

## 2022-04-29 HISTORY — DX: Leiomyoma of uterus, unspecified: D25.9

## 2022-04-29 HISTORY — DX: Overactive bladder: N32.81

## 2022-04-29 HISTORY — DX: Polyp of corpus uteri: N84.0

## 2022-04-29 HISTORY — PX: INTRAUTERINE DEVICE (IUD) INSERTION: SHX5877

## 2022-04-29 HISTORY — DX: Other ill-defined heart diseases: I51.89

## 2022-04-29 HISTORY — DX: Major depressive disorder, single episode, unspecified: F32.9

## 2022-04-29 HISTORY — PX: DILATATION & CURETTAGE/HYSTEROSCOPY WITH MYOSURE: SHX6511

## 2022-04-29 HISTORY — DX: Unspecified right bundle-branch block: I45.10

## 2022-04-29 HISTORY — DX: Unspecified osteoarthritis, unspecified site: M19.90

## 2022-04-29 HISTORY — PX: IUD REMOVAL: SHX5392

## 2022-04-29 HISTORY — DX: Mixed incontinence: N39.46

## 2022-04-29 HISTORY — DX: Personal history of diseases of the blood and blood-forming organs and certain disorders involving the immune mechanism: Z86.2

## 2022-04-29 HISTORY — DX: Generalized anxiety disorder: F41.1

## 2022-04-29 HISTORY — DX: Personal history of other diseases of the circulatory system: Z86.79

## 2022-04-29 HISTORY — DX: Prediabetes: R73.03

## 2022-04-29 LAB — POCT I-STAT, CHEM 8
BUN: 17 mg/dL (ref 8–23)
Calcium, Ion: 1.24 mmol/L (ref 1.15–1.40)
Chloride: 101 mmol/L (ref 98–111)
Creatinine, Ser: 1.1 mg/dL — ABNORMAL HIGH (ref 0.44–1.00)
Glucose, Bld: 118 mg/dL — ABNORMAL HIGH (ref 70–99)
HCT: 44 % (ref 36.0–46.0)
Hemoglobin: 15 g/dL (ref 12.0–15.0)
Potassium: 4 mmol/L (ref 3.5–5.1)
Sodium: 139 mmol/L (ref 135–145)
TCO2: 25 mmol/L (ref 22–32)

## 2022-04-29 SURGERY — DILATATION & CURETTAGE/HYSTEROSCOPY WITH MYOSURE
Anesthesia: General | Site: Vagina

## 2022-04-29 MED ORDER — MEPERIDINE HCL 25 MG/ML IJ SOLN: 6.2500 mg | INTRAMUSCULAR | Status: DC | PRN

## 2022-04-29 MED ORDER — DEXAMETHASONE SODIUM PHOSPHATE 10 MG/ML IJ SOLN
INTRAMUSCULAR | Status: AC
  Filled 2022-04-29: qty 1

## 2022-04-29 MED ORDER — MIDAZOLAM HCL 2 MG/2ML IJ SOLN
INTRAMUSCULAR | Status: AC
  Filled 2022-04-29: qty 2

## 2022-04-29 MED ORDER — PROPOFOL 10 MG/ML IV BOLUS
INTRAVENOUS | Status: AC
  Filled 2022-04-29: qty 20

## 2022-04-29 MED ORDER — SODIUM CHLORIDE 0.9 % IR SOLN
Status: DC | PRN
  Administered 2022-04-29: 3000 mL
  Administered 2022-04-29: 1500 mL

## 2022-04-29 MED ORDER — KETOROLAC TROMETHAMINE 30 MG/ML IJ SOLN
INTRAMUSCULAR | Status: DC | PRN
  Administered 2022-04-29: 30 mg via INTRAVENOUS

## 2022-04-29 MED ORDER — KETOROLAC TROMETHAMINE 30 MG/ML IJ SOLN
INTRAMUSCULAR | Status: AC
  Filled 2022-04-29: qty 1

## 2022-04-29 MED ORDER — PHENYLEPHRINE HCL (PRESSORS) 10 MG/ML IV SOLN
INTRAVENOUS | Status: DC | PRN
  Administered 2022-04-29 (×3): 80 ug via INTRAVENOUS

## 2022-04-29 MED ORDER — LACTATED RINGERS IV SOLN: INTRAVENOUS | Status: DC

## 2022-04-29 MED ORDER — MIDAZOLAM HCL 2 MG/2ML IJ SOLN: 0.5000 mg | Freq: Once | INTRAMUSCULAR | Status: DC | PRN

## 2022-04-29 MED ORDER — FENTANYL CITRATE (PF) 100 MCG/2ML IJ SOLN
INTRAMUSCULAR | Status: AC
  Filled 2022-04-29: qty 2

## 2022-04-29 MED ORDER — ACETAMINOPHEN 500 MG PO TABS
1000.0000 mg | ORAL_TABLET | Freq: Once | ORAL | Status: AC
Start: 2022-04-29 — End: 2022-04-29
  Administered 2022-04-29: 1000 mg via ORAL

## 2022-04-29 MED ORDER — SCOPOLAMINE 1 MG/3DAYS TD PT72
1.0000 | MEDICATED_PATCH | TRANSDERMAL | Status: DC
  Administered 2022-04-29: 1.5 mg via TRANSDERMAL

## 2022-04-29 MED ORDER — OXYCODONE HCL 5 MG/5ML PO SOLN: 5.0000 mg | Freq: Once | ORAL | Status: DC | PRN

## 2022-04-29 MED ORDER — LEVONORGESTREL 20 MCG/DAY IU IUD
INTRAUTERINE_SYSTEM | INTRAUTERINE | Status: AC
  Filled 2022-04-29: qty 1

## 2022-04-29 MED ORDER — FENTANYL CITRATE (PF) 100 MCG/2ML IJ SOLN: 25.0000 ug | INTRAMUSCULAR | Status: DC | PRN

## 2022-04-29 MED ORDER — LEVONORGESTREL 20 MCG/DAY IU IUD
1.0000 | INTRAUTERINE_SYSTEM | INTRAUTERINE | Status: AC
  Administered 2022-04-29: 1 via INTRAUTERINE

## 2022-04-29 MED ORDER — SCOPOLAMINE 1 MG/3DAYS TD PT72
MEDICATED_PATCH | TRANSDERMAL | Status: AC
  Filled 2022-04-29: qty 1

## 2022-04-29 MED ORDER — DEXAMETHASONE SODIUM PHOSPHATE 4 MG/ML IJ SOLN
INTRAMUSCULAR | Status: DC | PRN
  Administered 2022-04-29: 10 mg via INTRAVENOUS

## 2022-04-29 MED ORDER — PROPOFOL 10 MG/ML IV BOLUS
INTRAVENOUS | Status: DC | PRN
  Administered 2022-04-29: 200 mg via INTRAVENOUS

## 2022-04-29 MED ORDER — MIDAZOLAM HCL 5 MG/5ML IJ SOLN
INTRAMUSCULAR | Status: DC | PRN
  Administered 2022-04-29 (×2): 1 mg via INTRAVENOUS

## 2022-04-29 MED ORDER — FENTANYL CITRATE (PF) 100 MCG/2ML IJ SOLN
INTRAMUSCULAR | Status: DC | PRN
  Administered 2022-04-29 (×2): 50 ug via INTRAVENOUS
  Administered 2022-04-29: 100 ug via INTRAVENOUS

## 2022-04-29 MED ORDER — ONDANSETRON HCL 4 MG/2ML IJ SOLN
INTRAMUSCULAR | Status: DC | PRN
  Administered 2022-04-29: 4 mg via INTRAVENOUS

## 2022-04-29 MED ORDER — ACETAMINOPHEN 500 MG PO TABS
ORAL_TABLET | ORAL | Status: AC
  Filled 2022-04-29: qty 2

## 2022-04-29 MED ORDER — OXYCODONE HCL 5 MG PO TABS: 5.0000 mg | ORAL_TABLET | Freq: Once | ORAL | Status: DC | PRN

## 2022-04-29 MED ORDER — ONDANSETRON HCL 4 MG/2ML IJ SOLN
INTRAMUSCULAR | Status: AC
  Filled 2022-04-29: qty 2

## 2022-04-29 MED ORDER — VASOPRESSIN 20 UNIT/ML IV SOLN
INTRAVENOUS | Status: DC | PRN
  Administered 2022-04-29: 20 mL via SURGICAL_CAVITY

## 2022-04-29 MED ORDER — LIDOCAINE HCL (CARDIAC) PF 100 MG/5ML IV SOSY
PREFILLED_SYRINGE | INTRAVENOUS | Status: DC | PRN
  Administered 2022-04-29: 40 mg via INTRAVENOUS

## 2022-04-29 SURGICAL SUPPLY — 29 items
CATH ROBINSON RED A/P 16FR (CATHETERS) IMPLANT
DEVICE MYOSURE LITE (MISCELLANEOUS) IMPLANT
DEVICE MYOSURE REACH (MISCELLANEOUS) ×1 IMPLANT
DILATOR CANAL MILEX (MISCELLANEOUS) IMPLANT
DRSG TELFA 3X8 NADH (GAUZE/BANDAGES/DRESSINGS) IMPLANT
GAUZE 4X4 16PLY ~~LOC~~+RFID DBL (SPONGE) ×5 IMPLANT
GLOVE BIO SURGEON STRL SZ 6.5 (GLOVE) ×3 IMPLANT
GOWN STRL REUS W/TWL LRG LVL3 (GOWN DISPOSABLE) ×3 IMPLANT
IV NS IRRIG 3000ML ARTHROMATIC (IV SOLUTION) ×4 IMPLANT
KIT PROCEDURE FLUENT (KITS) ×3 IMPLANT
KIT TURNOVER CYSTO (KITS) ×3 IMPLANT
MYOSURE XL FIBROID (MISCELLANEOUS) ×2
Mirena Levonogestrel-Releasing Intrauterine System ×1 IMPLANT
NDL SAFETY ECLIPSE 18X1.5 (NEEDLE) IMPLANT
NEEDLE HYPO 18GX1.5 SHARP (NEEDLE) ×2
PACK VAGINAL MINOR WOMEN LF (CUSTOM PROCEDURE TRAY) ×3 IMPLANT
PAD DRESSING TELFA 3X8 NADH (GAUZE/BANDAGES/DRESSINGS) ×2 IMPLANT
PAD OB MATERNITY 4.3X12.25 (PERSONAL CARE ITEMS) ×3 IMPLANT
PAD PREP 24X48 CUFFED NSTRL (MISCELLANEOUS) ×3 IMPLANT
SEAL CERVICAL OMNI LOK (ABLATOR) IMPLANT
SEAL ROD LENS SCOPE MYOSURE (ABLATOR) ×3 IMPLANT
SOL PREP POV-IOD 4OZ 10% (MISCELLANEOUS) ×1 IMPLANT
SUT VIC AB 4-0 SH 27 (SUTURE) ×4
SUT VIC AB 4-0 SH 27XANBCTRL (SUTURE) IMPLANT
SYR 20ML LL LF (SYRINGE) IMPLANT
SYR TB 1ML LL NO SAFETY (SYRINGE) ×1 IMPLANT
SYSTEM TISS REMOVAL MYOSURE XL (MISCELLANEOUS) IMPLANT
TOWEL OR 17X26 10 PK STRL BLUE (TOWEL DISPOSABLE) ×5 IMPLANT
WATER STERILE IRR 500ML POUR (IV SOLUTION) IMPLANT

## 2022-04-29 NOTE — Op Note (Addendum)
Preoperative Diagnosis: postmenopausal bleeding, endometrial polyps, history of endometrial hyperplasia  Postoperative Diagnosis: postmenopausal bleeding, intracavitary myoma, history of endometrial hyperplasia  Procedure: Removal of mirena intrauterine device, hysteroscopic myomectomy, dilation and curettage, placement of mirena intrauterine device, suture of vulvar laceration  Surgeon: Dr Sumner Boast  Assistants: None  Anesthesia: General via LMA  EBL: 25  Fluids: 800  Fluid deficit: 1,270  Urine output: Not recorded  Indications for surgery: The patient is a 66 yo female, who presented with a history of endometrial hyperplasia and need for mirena IUD exchange (overdue). She was pretreated with cytotec and had some spotting the day of her IUD exchange. An endometrial biopsy was done that returned with endometrial polyp. Ultrasound was c/w intramural fibroids and endometrial polyps.  The risks of the surgery were reviewed with the patient and the consent form was signed prior to her surgery.  Findings: vulva: diffuse loss of pigmentation, thinning of the skin diffusely. Hysteroscopy: posterior, intracavitary myoma, smaller myoma vs polyp also on the posterior uterine wall. Post procedure she was noted to have 3 simple lacerations in her skin from gentle traction, washing. One was above her clitoris, one between the clitoris and right labia majora and one on the perineum.  Specimens: myoma, endometrial curettings   Procedure: The patient was taken to the operating room with an IV in place. She was placed in the dorsal lithotomy position and anesthesia was administered. She was prepped and draped in the usual sterile fashion for a vaginal procedure. She voided on the way to the OR. A speculum was placed in the vagina and a single tooth tenaculum was placed on the anterior lip of the cervix. The cervix was dilated to a #6 hagar dilator. The uterus was sounded to ~9 cm. The myosure  hysteroscope was inserted into the uterine cavity. With continuous infusion of normal saline, the uterine cavity was visualized with the above findings. The fibroid appeared to be a few cm so the decision was used to attempt to resect the fibroid with the myosure reach. The fibroid was very firm and the myosure reach wasn't adequately removing it. The myosure reach was able to resect the smaller intracavitary lesion. The hysteroscope was removed, the cervix was dilated to a 7 hagar dilator and the larger myosure scope was placed into the uterine cavity. Using the myosure XL device the fibroid was resected. The myosure was then removed. The cavity was then curetted with the small sharp curette. The cavity had the characteristically gritty texture at the end of the procedure. The curette was removed and the myosure was placed back in the uterine cavity. There was some clot noted in the uterus, no further expulsion of the fibroid was noted. The hysteroscope was removed and there was some bleeding noted coming from within the uterus. The decision was made to use vasopressin. 20 IU of vasopressin was mixed with 100 cc of NS. ~5 cc of this solution was injected in the cervix at 12,3,6, and 9 o'clock. The bleeding stopped. The tenaculum and speculum were removed. After removal of the speculum it was noted that her paper thin skin had split in 3 different areas. These were closed with stitches of 4-0 vicryl for comfort (not bleeding).  The patients perineum was carefully cleansed of betadine and she was taken out of the dorsal lithotomy position.  Upon awakening the LMA was removed and the patient was transferred to the recovery room in stable and awake condition.  The sponge and instrument count  were correct.  Addendum:  Prior to dilation of the cervix the mirena IUD was removed. After the hysteroscopy, D&C and after the intracervical placement of the vasopressin with excellent hemostasis, the new mirena IUD was  placed in the standard fashion, the strings were cut to 3 cm.

## 2022-04-29 NOTE — Discharge Instructions (Addendum)
DISCHARGE INSTRUCTIONS: HYSTEROSCOPY / Dilation & Curettage  Do NOT take Motrin/Advil/Ibuprofen until after 5pm today.  Do NOT take Tylenol before 3pm today   The following instructions have been prepared to help you care for yourself upon your return home.  May Remove Scop patch on or before 6/16  May take stool softner while taking narcotic pain medication to prevent constipation.  Drink plenty of water.  Personal hygiene:  Use sanitary pads for vaginal drainage, not tampons.  Shower the day after your procedure.  NO tub baths, pools or Jacuzzis for 2-3 weeks.  Wipe front to back after using the bathroom.  Activity and limitations:  Do NOT drive or operate any equipment for 24 hours. The effects of anesthesia are still present and drowsiness may result.  Do NOT rest in bed all day.  Walking is encouraged.  Walk up and down stairs slowly.  You may resume your normal activity in one to two days or as indicated by your physician. Sexual activity: NO intercourse for at least 2 weeks after the procedure, or as indicated by your Doctor.  Diet: Eat a light meal as desired this evening. You may resume your usual diet tomorrow.  Return to Work: You may resume your work activities in one to two days or as indicated by Marine scientist.  What to expect after your surgery: Expect to have vaginal bleeding/discharge for 2-3 days and spotting for up to 10 days. It is not unusual to have soreness for up to 1-2 weeks. You may have a slight burning sensation when you urinate for the first day. Mild cramps may continue for a couple of days. You may have a regular period in 2-6 weeks.  Call your doctor for any of the following:  Excessive vaginal bleeding or clotting, saturating and changing one pad every hour.  Inability to urinate 6 hours after discharge from hospital.  Pain not relieved by pain medication.  Fever of 100.4 F or greater.  Unusual vaginal discharge or odor.   Post Anesthesia  Care Unit [phone]    Post Anesthesia Home Care Instructions  Activity: Get plenty of rest for the remainder of the day. A responsible adult should stay with you for 24 hours following the procedure.  For the next 24 hours, DO NOT: -Drive a car -Paediatric nurse -Drink alcoholic beverages -Take any medication unless instructed by your physician -Make any legal decisions or sign important papers.  Meals: Start with liquid foods such as gelatin or soup. Progress to regular foods as tolerated. Avoid greasy, spicy, heavy foods. If nausea and/or vomiting occur, drink only clear liquids until the nausea and/or vomiting subsides. Call your physician if vomiting continues.  Special Instructions/Symptoms: Your throat may feel dry or sore from the anesthesia or the breathing tube placed in your throat during surgery. If this causes discomfort, gargle with warm salt water. The discomfort should disappear within 24 hours.  If you had a scopolamine patch placed behind your ear for the management of post- operative nausea and/or vomiting:  1. The medication in the patch is effective for 72 hours, after which it should be removed.  Wrap patch in a tissue and discard in the trash. Wash hands thoroughly with soap and water. 2. You may remove the patch earlier than 72 hours if you experience unpleasant side effects which may include dry mouth, dizziness or visual disturbances. 3. Avoid touching the patch. Wash your hands with soap and water after contact with the patch.

## 2022-04-29 NOTE — Transfer of Care (Signed)
Immediate Anesthesia Transfer of Care Note  Patient: Kristen Shaffer  Procedure(s) Performed: Procedure(s) (LRB): DILATATION & CURETTAGE/HYSTEROSCOPY WITH MYOSURE, REPAIR OF VAGNAL LACERATION (N/A) INTRAUTERINE DEVICE (IUD) REMOVAL (N/A) Mirena INTRAUTERINE DEVICE (IUD) INSERTION (N/A)  Patient Location: PACU  Anesthesia Type: General  Level of Consciousness: awake, sedated, patient cooperative and responds to stimulation  Airway & Oxygen Therapy: Patient Spontanous Breathing and Patient connected to  02   Post-op Assessment: Report given to PACU RN, Post -op Vital signs reviewed and stable and Patient moving all extremities  Post vital signs: Reviewed and stable  Complications: No apparent anesthesia complications

## 2022-04-29 NOTE — Anesthesia Preprocedure Evaluation (Addendum)
Anesthesia Evaluation  Patient identified by MRN, date of birth, ID band Patient awake    Reviewed: Allergy & Precautions, NPO status , Patient's Chart, lab work & pertinent test results, reviewed documented beta blocker date and time   History of Anesthesia Complications Negative for: history of anesthetic complications  Airway Mallampati: I  TM Distance: >3 FB Neck ROM: Full    Dental  (+) Chipped, Dental Advisory Given, Poor Dentition   Pulmonary sleep apnea (mild, does not use CPAP) ,    breath sounds clear to auscultation       Cardiovascular hypertension, Pt. on medications and Pt. on home beta blockers (-) angina Rhythm:Regular Rate:Normal     Neuro/Psych Anxiety Depression negative neurological ROS     GI/Hepatic Neg liver ROS, GERD  Medicated and Controlled,  Endo/Other  Morbid obesity  Renal/GU Renal InsufficiencyRenal disease     Musculoskeletal  (+) Arthritis ,   Abdominal (+) + obese,   Peds  Hematology negative hematology ROS (+)   Anesthesia Other Findings   Reproductive/Obstetrics                            Anesthesia Physical Anesthesia Plan  ASA: 3  Anesthesia Plan: General   Post-op Pain Management: Tylenol PO (pre-op)*   Induction: Intravenous  PONV Risk Score and Plan: 3 and Ondansetron, Dexamethasone and Scopolamine patch - Pre-op  Airway Management Planned: LMA  Additional Equipment: None  Intra-op Plan:   Post-operative Plan:   Informed Consent: I have reviewed the patients History and Physical, chart, labs and discussed the procedure including the risks, benefits and alternatives for the proposed anesthesia with the patient or authorized representative who has indicated his/her understanding and acceptance.     Dental advisory given  Plan Discussed with: CRNA and Surgeon  Anesthesia Plan Comments:        Anesthesia Quick Evaluation

## 2022-04-29 NOTE — Anesthesia Postprocedure Evaluation (Signed)
Anesthesia Post Note  Patient: JANAYAH ZAVADA  Procedure(s) Performed: DILATATION & CURETTAGE/HYSTEROSCOPY WITH MYOSURE, REPAIR OF VAGNAL LACERATION (Vagina ) INTRAUTERINE DEVICE (IUD) REMOVAL (Vagina ) Mirena INTRAUTERINE DEVICE (IUD) INSERTION (Vagina )     Patient location during evaluation: PACU Anesthesia Type: General Level of consciousness: awake and alert, patient cooperative and oriented Pain management: pain level controlled Vital Signs Assessment: post-procedure vital signs reviewed and stable Respiratory status: spontaneous breathing, nonlabored ventilation and respiratory function stable Cardiovascular status: blood pressure returned to baseline and stable Postop Assessment: no apparent nausea or vomiting and able to ambulate Anesthetic complications: no   No notable events documented.  Last Vitals:  Vitals:   04/29/22 1130 04/29/22 1145  BP: (!) 148/93 139/87  Pulse: 73 67  Resp: 19 (!) 21  Temp:    SpO2: 98% 94%    Last Pain:  Vitals:   04/29/22 1200  TempSrc:   PainSc: 4                  Stephaun Million,E. Vito Beg

## 2022-04-29 NOTE — OR Nursing (Signed)
IUD removed by Dr. Talbert Nan in [place] #3.

## 2022-04-29 NOTE — Interval H&P Note (Signed)
History and Physical Interval Note:  04/29/2022 9:48 AM  Kristen Shaffer  has presented today for surgery, with the diagnosis of endometrial polyp, hx of endometrial hyperplasia.  The various methods of treatment have been discussed with the patient and family. After consideration of risks, benefits and other options for treatment, the patient has consented to  Procedure(s): DILATATION & CURETTAGE/HYSTEROSCOPY WITH MYOSURE (N/A) INTRAUTERINE DEVICE (IUD) REMOVAL (N/A) [place] (IUD) INSERTION (N/A) as a surgical intervention.  The patient's history has been reviewed, patient examined, no change in status, stable for surgery.  I have reviewed the patient's chart and labs.  Questions were answered to the patient's satisfaction.     [name]

## 2022-04-29 NOTE — Anesthesia Procedure Notes (Signed)
Procedure Name: LMA Insertion Date/Time: 04/29/2022 10:07 AM  Performed by: Justice Rocher, CRNAPre-anesthesia Checklist: Patient identified, Emergency Drugs available, Suction available, Patient being monitored and Timeout performed Patient Re-evaluated:Patient Re-evaluated prior to induction Oxygen Delivery Method: Circle system utilized Preoxygenation: Pre-oxygenation with 100% oxygen Induction Type: IV induction Ventilation: Mask ventilation without difficulty LMA: LMA inserted LMA Size: 4.0 Number of attempts: 1 Airway Equipment and Method: Bite block Placement Confirmation: positive ETCO2, breath sounds checked- equal and bilateral and CO2 detector Tube secured with: Tape Dental Injury: Teeth and Oropharynx as per pre-operative assessment  Comments: Ramped shoulder and upper body with gray wedge support , soft pillow and round head rest for optimum positioning for airway management

## 2022-04-30 ENCOUNTER — Encounter (HOSPITAL_BASED_OUTPATIENT_CLINIC_OR_DEPARTMENT_OTHER): Payer: Self-pay | Admitting: Obstetrics and Gynecology

## 2022-04-30 LAB — SURGICAL PATHOLOGY

## 2022-05-06 ENCOUNTER — Telehealth: Payer: Self-pay | Admitting: *Deleted

## 2022-05-06 NOTE — Telephone Encounter (Signed)
Patient called had D&C on 04/29/22 left message in triage voicemail with a questions. Patient had to reschedule post-op visit from 05/07/22 now scheduled on 05/13/22. Left message for patient to call.

## 2022-05-07 ENCOUNTER — Encounter: Payer: Medicare HMO | Admitting: Obstetrics and Gynecology

## 2022-05-12 ENCOUNTER — Ambulatory Visit
Admission: RE | Admit: 2022-05-12 | Discharge: 2022-05-12 | Disposition: A | Discharge status: Home / Self Care | Payer: Medicare HMO | Source: Ambulatory Visit | Attending: Internal Medicine | Admitting: Internal Medicine

## 2022-05-12 DIAGNOSIS — Z1231 Encounter for screening mammogram for malignant neoplasm of breast: Secondary | ICD-10-CM

## 2022-05-13 ENCOUNTER — Ambulatory Visit (INDEPENDENT_AMBULATORY_CARE_PROVIDER_SITE_OTHER): Payer: Medicare HMO | Admitting: Obstetrics and Gynecology

## 2022-05-13 ENCOUNTER — Encounter: Payer: Self-pay | Admitting: Obstetrics and Gynecology

## 2022-05-13 VITALS — BP 122/84 | HR 87 | Ht 69.0 in | Wt 294.0 lb

## 2022-05-13 DIAGNOSIS — Z9889 Other specified postprocedural states: Secondary | ICD-10-CM

## 2022-05-13 DIAGNOSIS — Z30431 Encounter for routine checking of intrauterine contraceptive device: Secondary | ICD-10-CM

## 2022-05-13 NOTE — Telephone Encounter (Signed)
Patient was seen today on 05/13/22

## 2022-05-14 ENCOUNTER — Other Ambulatory Visit: Payer: Self-pay | Admitting: Internal Medicine

## 2022-05-14 DIAGNOSIS — R928 Other abnormal and inconclusive findings on diagnostic imaging of breast: Secondary | ICD-10-CM

## 2022-05-19 ENCOUNTER — Ambulatory Visit
Admission: RE | Admit: 2022-05-19 | Discharge: 2022-05-19 | Disposition: A | Discharge status: Home / Self Care | Payer: Medicare HMO | Source: Ambulatory Visit | Attending: Internal Medicine | Admitting: Internal Medicine

## 2022-05-19 ENCOUNTER — Ambulatory Visit: Payer: Medicare HMO

## 2022-05-19 DIAGNOSIS — N6489 Other specified disorders of breast: Secondary | ICD-10-CM | POA: Diagnosis not present

## 2022-05-19 DIAGNOSIS — R928 Other abnormal and inconclusive findings on diagnostic imaging of breast: Secondary | ICD-10-CM | POA: Diagnosis not present

## 2022-05-21 DIAGNOSIS — R69 Illness, unspecified: Secondary | ICD-10-CM | POA: Diagnosis not present

## 2022-06-18 DIAGNOSIS — J4520 Mild intermittent asthma, uncomplicated: Secondary | ICD-10-CM | POA: Diagnosis not present

## 2022-06-18 DIAGNOSIS — E559 Vitamin D deficiency, unspecified: Secondary | ICD-10-CM | POA: Diagnosis not present

## 2022-06-18 DIAGNOSIS — I5032 Chronic diastolic (congestive) heart failure: Secondary | ICD-10-CM | POA: Diagnosis not present

## 2022-06-18 DIAGNOSIS — I10 Essential (primary) hypertension: Secondary | ICD-10-CM | POA: Diagnosis not present

## 2022-06-18 DIAGNOSIS — R7303 Prediabetes: Secondary | ICD-10-CM | POA: Diagnosis not present

## 2022-06-18 DIAGNOSIS — Z0000 Encounter for general adult medical examination without abnormal findings: Secondary | ICD-10-CM | POA: Diagnosis not present

## 2022-08-05 DIAGNOSIS — I5032 Chronic diastolic (congestive) heart failure: Secondary | ICD-10-CM | POA: Diagnosis not present

## 2022-08-05 DIAGNOSIS — Z23 Encounter for immunization: Secondary | ICD-10-CM | POA: Diagnosis not present

## 2022-08-05 DIAGNOSIS — I10 Essential (primary) hypertension: Secondary | ICD-10-CM | POA: Diagnosis not present

## 2022-08-05 DIAGNOSIS — K5909 Other constipation: Secondary | ICD-10-CM | POA: Diagnosis not present

## 2022-08-05 DIAGNOSIS — R7303 Prediabetes: Secondary | ICD-10-CM | POA: Diagnosis not present

## 2022-08-05 DIAGNOSIS — F322 Major depressive disorder, single episode, severe without psychotic features: Secondary | ICD-10-CM | POA: Diagnosis not present

## 2022-09-01 DIAGNOSIS — F322 Major depressive disorder, single episode, severe without psychotic features: Secondary | ICD-10-CM | POA: Diagnosis not present

## 2022-09-01 DIAGNOSIS — I10 Essential (primary) hypertension: Secondary | ICD-10-CM | POA: Diagnosis not present

## 2022-09-01 DIAGNOSIS — J4520 Mild intermittent asthma, uncomplicated: Secondary | ICD-10-CM | POA: Diagnosis not present

## 2022-10-22 DIAGNOSIS — I5032 Chronic diastolic (congestive) heart failure: Secondary | ICD-10-CM | POA: Diagnosis not present

## 2022-10-22 DIAGNOSIS — Z6841 Body Mass Index (BMI) 40.0 and over, adult: Secondary | ICD-10-CM | POA: Diagnosis not present

## 2022-10-22 DIAGNOSIS — I10 Essential (primary) hypertension: Secondary | ICD-10-CM | POA: Diagnosis not present

## 2022-10-22 DIAGNOSIS — Z7189 Other specified counseling: Secondary | ICD-10-CM | POA: Diagnosis not present

## 2022-10-22 DIAGNOSIS — F322 Major depressive disorder, single episode, severe without psychotic features: Secondary | ICD-10-CM | POA: Diagnosis not present

## 2022-10-22 DIAGNOSIS — R7303 Prediabetes: Secondary | ICD-10-CM | POA: Diagnosis not present

## 2022-11-08 ENCOUNTER — Other Ambulatory Visit: Payer: Self-pay | Admitting: Cardiovascular Disease

## 2022-12-03 DIAGNOSIS — R7303 Prediabetes: Secondary | ICD-10-CM | POA: Diagnosis not present

## 2022-12-03 DIAGNOSIS — L259 Unspecified contact dermatitis, unspecified cause: Secondary | ICD-10-CM | POA: Diagnosis not present

## 2022-12-03 DIAGNOSIS — I10 Essential (primary) hypertension: Secondary | ICD-10-CM | POA: Diagnosis not present

## 2022-12-03 DIAGNOSIS — I5032 Chronic diastolic (congestive) heart failure: Secondary | ICD-10-CM | POA: Diagnosis not present

## 2023-03-04 NOTE — Progress Notes (Deleted)
   Rubin Payor, PhD, LAT, ATC acting as a scribe for Clementeen Graham, MD.  Subjective:    CC: Bilateral knee pain  HPI: Patient is a 67 year old female presenting with bilateral knee pain ongoing***.  Patient locates pain to***  Knee swelling: Mechanical symptoms: Radiates: Aggravates: Treatments tried:   Pertinent review of Systems: ***  Relevant historical information: ***   Objective:   There were no vitals filed for this visit. General: Well Developed, well nourished, and in no acute distress.   MSK: ***  Lab and Radiology Results No results found for this or any previous visit (from the past 72 hour(s)). No results found.    Impression and Recommendations:    Assessment and Plan: 67 y.o. female with ***.  PDMP not reviewed this encounter. No orders of the defined types were placed in this encounter.  No orders of the defined types were placed in this encounter.   Discussed warning signs or symptoms. Please see discharge instructions. Patient expresses understanding.   ***

## 2023-03-05 ENCOUNTER — Ambulatory Visit: Payer: Medicare HMO | Admitting: Family Medicine

## 2023-03-10 ENCOUNTER — Ambulatory Visit
Admission: EM | Admit: 2023-03-10 | Discharge: 2023-03-10 | Disposition: A | Discharge status: Home / Self Care | Payer: Medicare HMO | Attending: Physician Assistant | Admitting: Physician Assistant

## 2023-03-10 ENCOUNTER — Other Ambulatory Visit: Payer: Self-pay

## 2023-03-10 DIAGNOSIS — M79605 Pain in left leg: Secondary | ICD-10-CM | POA: Diagnosis not present

## 2023-03-10 DIAGNOSIS — W19XXXA Unspecified fall, initial encounter: Secondary | ICD-10-CM | POA: Diagnosis not present

## 2023-03-10 MED ORDER — LIDOCAINE 5 % EX PTCH
1.0000 | MEDICATED_PATCH | CUTANEOUS | 0 refills | Status: DC
Start: 2023-03-10 — End: 2023-07-09

## 2023-03-10 NOTE — ED Triage Notes (Signed)
Pt presents with head pain and left hip pain after a fall last night in her home.

## 2023-03-10 NOTE — ED Provider Notes (Signed)
[facility]    CSN: [id number] Arrival date & time: 03/10/23  1243      History   Chief Complaint Chief Complaint  Patient presents with   Fall    HPI Kristen Shaffer is a 67 y.o. female.   Patient presents today for evaluation after fall that occurred at approximately 9:30 PM yesterday.  Reports that she took a misstep and fell onto her left hip and hit her head on the metal railing.  Her friend was very concerned and she agreed to be checked out today.  She has not developed any significant symptoms and denies any associated loss of consciousness, headache, dizziness, visual disturbance.  She is experiencing some pain in her left lateral leg where she fell.  She reports this is currently rated 3/4 in a 0-10 pain scale, described as aching, no aggravating or alleviating factors identified.  She has a history of chronic pain that interferes with her mobility and states that this is at baseline.  She does not take any blood thinning medications.  She has not tried any over-the-counter medication for symptom management.  She is able to ambulate with her cane as normal.    Past Medical History:  Diagnosis Date   Bilateral leg edema    Diastolic dysfunction 2020   cardiologist--- dr berry   Endometrial polyp    GAD (generalized anxiety disorder)    GERD (gastroesophageal reflux disease)    H/O: rheumatic fever    in childhood   Herniated disc    History of anemia    history   History of asthma    Childhood   History of COVID-19 05/2019   admission in epic  acute respiratory failure w/ hypoxmia due to covid   History of endometrial hyperplasia 2010   complex dysplasia  per speciman from d&c hysteroscopy 11-24-2008   Hypertension    no medications at this time   Lichen sclerosus    MDD (major depressive disorder)    Mild obstructive sleep apnea    study in epic 04-06-2010 ,  per pt no recommendation for cpap   Mixed incontinence urge and stress    OA  (osteoarthritis)    knees   OAB (overactive bladder)    Pre-diabetes    RBBB (right bundle branch block)    Sciatic leg pain    Uterine leiomyoma    Vocal cord nodule    followed by dr d. Jenne Pane (ENT)---  per pt caused by GERD   Wears glasses     Patient Active Problem List   Diagnosis Date Noted   Lumbar herniated disc 01/21/2022   Elevated testosterone level 05/25/2021   Vocal nodules in adults 04/17/2020   Hoarseness 10/04/2019   Diastolic dysfunction 08/24/2019   Leg swelling    Morbid obesity 06/11/2019   Weakness    Acute respiratory failure with hypoxia 06/09/2019   COVID-19 virus infection 06/08/2019   GERD (gastroesophageal reflux disease)    AKI (acute kidney injury)    Arthritis    Hypertension    Depression    Fibroid     Past Surgical History:  Procedure Laterality Date   COLONOSCOPY     DILATATION & CURETTAGE/HYSTEROSCOPY WITH MYOSURE N/A 10/23/2015   Procedure: DILATATION & CURETTAGE/HYSTEROSCOPY WITH MYOSURE;  Surgeon: Dara Lords, MD;  Location: [facility];  Service: Gynecology;  Laterality: N/A;   DILATATION & CURETTAGE/HYSTEROSCOPY WITH MYOSURE N/A 04/29/2022   Procedure: DILATATION & CURETTAGE/HYSTEROSCOPY WITH MYOSURE, REPAIR OF VAGNAL LACERATION;  Surgeon: Romualdo Bolk, MD;  Location: [facility];  Service: Gynecology;  Laterality: N/A;   DILATION AND CURETTAGE OF UTERUS     1990s  in [place]   HYSTEROSCOPY WITH D & C  12/04/2008   @[facility]    INTRAUTERINE DEVICE (IUD) INSERTION N/A 04/29/2022   Procedure: Mirena INTRAUTERINE DEVICE (IUD) INSERTION;  Surgeon: Romualdo Bolk, MD;  Location: [facility];  Service: Gynecology;  Laterality: N/A;   IUD REMOVAL N/A 04/29/2022   Procedure: INTRAUTERINE DEVICE (IUD) REMOVAL;  Surgeon: Romualdo Bolk, MD;  Location: [facility];  Service: Gynecology;  Laterality: N/A;   MYOMECTOMY ABDOMINAL APPROACH  1999   TONSILLECTOMY     child    OB History      Gravida  2   Para      Term      Preterm      AB  2   Living  0      SAB      IAB      Ectopic      Multiple      Live Births               Home Medications    Prior to Admission medications   Medication Sig Start Date End Date Taking? Authorizing Provider  lidocaine (LIDODERM) 5 % Place 1 patch onto the skin daily. Remove & Discard patch within 12 hours or as directed by MD 03/10/23  Yes Rasaan Brotherton, Noberto Retort, PA-C  acetaminophen (TYLENOL) 650 MG CR tablet Take 650 mg by mouth every 8 (eight) hours as needed for pain.    [provider]  amitriptyline (ELAVIL) 25 MG tablet Take 25 mg by mouth at bedtime as needed. 25-50mg     [provider]  Ascorbic Acid (VITAMIN C PO) Take by mouth daily.    [provider]  betamethasone valerate ointment (VALISONE) 0.1 % Apply a pea sized amount topically q week, can increase to BID for up to 2 weeks as needed for a flare. 01/21/22   Romualdo Bolk, MD  Blood Pressure Monitoring (BLOOD PRESSURE CUFF) MISC 1 Package by Does not apply route daily. 08/29/19   Runell Gess, MD  carvedilol (COREG) 6.25 MG tablet TAKE 1 TABLET TWICE DAILY WITH MEALS 11/11/22   Runell Gess, MD  levonorgestrel (MIRENA, 52 MG,) 20 MCG/24HR IUD Mirena 20 mcg/24 hours (7 yrs) 52 mg intrauterine device  Take 1 device by intrauterine route.    [provider]  MAGNESIUM PO Take by mouth daily.    [provider]  methocarbamol (ROBAXIN) 750 MG tablet Take 750 mg by mouth every 6 (six) hours as needed.    [provider]  Multiple Vitamins-Minerals (MULTIVITAMIN ADULTS PO) Take by mouth daily.    [provider]  Multiple Vitamins-Minerals (ZINC PO) Take by mouth daily.    [provider]  omeprazole (PRILOSEC) 40 MG capsule Take 40 mg by mouth 2 (two) times daily. 03/18/21   [provider]  oxybutynin (DITROPAN) 5 MG tablet Take 5 mg by mouth 2 (two) times daily.  06/05/21   [provider]  OZEMPIC, 0.25 OR 0.5 MG/DOSE, 2 MG/3ML SOPN Inject into the skin once a week. Thursday 03/31/22   [provider]  polyethylene glycol (MIRALAX / GLYCOLAX) 17 g packet Take 17 g by mouth daily as needed. 07/22/19   Leroy Sea, MD  spironolactone (ALDACTONE) 25 MG tablet TAKE  1 TABLET EVERY DAY 11/11/22   Runell Gess, MD  torsemide (DEMADEX) 20 MG tablet Take 1 tablet (20 mg total) by mouth 2 (two) times daily. Patient taking differently: Take 10-20 mg by mouth daily. 08/24/19 04/29/22  Runell Gess, MD  trimethoprim (TRIMPEX) 100 MG tablet Take 100 mg by mouth daily.    [provider]  Vitamin D, Ergocalciferol, (DRISDOL) 1.25 MG (50000 UNIT) CAPS capsule Take 50,000 Units by mouth once a week. 04/02/21   [provider]    Family History Family History  Problem Relation Age of Onset   Diabetes Mother    Hypertension Mother    Diabetes Father    Heart disease Sister    Cancer Sister        Colon   Cancer Brother        Stomach   Hypertension Maternal Grandmother    Diabetes Maternal Grandmother    Polycystic ovary syndrome Neg Hx     Social History Social History   Tobacco Use   Smoking status: Never   Smokeless tobacco: Never  Vaping Use   Vaping Use: Never used  Substance Use Topics   Alcohol use: Not Currently    Comment: seldom   Drug use: Never     Allergies   Patient has no known allergies.   Review of Systems Review of Systems  Constitutional:  Negative for activity change, appetite change, fatigue and fever.  Eyes:  Negative for photophobia and visual disturbance.  Respiratory:  Negative for cough and shortness of breath.   Cardiovascular:  Negative for chest pain.  Gastrointestinal:  Negative for abdominal pain, diarrhea, nausea and vomiting.  Musculoskeletal:  Positive for arthralgias. Negative for myalgias.  Neurological:  Negative for dizziness, syncope, facial asymmetry,  speech difficulty, weakness, light-headedness, numbness and headaches.     Physical Exam Triage Vital Signs ED Triage Vitals  Enc Vitals Group     BP 03/10/23 1426 114/71     Pulse Rate 03/10/23 1426 77     Resp 03/10/23 1426 17     Temp 03/10/23 1426 98 F (36.7 C)     Temp Source 03/10/23 1426 Oral     SpO2 03/10/23 1426 94 %     Weight --      Height --      Head Circumference --      Peak Flow --      Pain Score 03/10/23 1425 5     Pain Loc --      Pain Edu? --      Excl. in GC? --    No data found.  Updated Vital Signs BP 114/71 (BP Location: Left Arm)   Pulse 77   Temp 98 F (36.7 C) (Oral)   Resp 17   SpO2 94%   Visual Acuity Right Eye Distance:   Left Eye Distance:   Bilateral Distance:    Right Eye Near:   Left Eye Near:    Bilateral Near:     Physical Exam Vitals reviewed.  Constitutional:      General: She is awake. She is not in acute distress.    Appearance: Normal appearance. She is well-developed. She is not ill-appearing.     Comments: Very pleasant female appears stated age in no acute distress sitting comfortably in exam room  HENT:     Head: Normocephalic and atraumatic. No raccoon eyes, Battle's sign or contusion.     Right Ear: Tympanic membrane, ear canal and external  ear normal. No hemotympanum.     Left Ear: Tympanic membrane, ear canal and external ear normal. No hemotympanum.     Mouth/Throat:     Tongue: Tongue does not deviate from midline.     Pharynx: Uvula midline. No oropharyngeal exudate or posterior oropharyngeal erythema.  Eyes:     Extraocular Movements: Extraocular movements intact.     Conjunctiva/sclera: Conjunctivae normal.     Pupils: Pupils are equal, round, and reactive to light.  Cardiovascular:     Rate and Rhythm: Normal rate and regular rhythm.     Heart sounds: Normal heart sounds, S1 normal and S2 normal. No murmur heard. Pulmonary:     Effort: Pulmonary effort is normal.     Breath sounds: Normal breath  sounds. No wheezing, rhonchi or rales.  Musculoskeletal:     Cervical back: No spinous process tenderness or muscular tenderness.     Left upper leg: No swelling, lacerations, tenderness or bony tenderness.     Comments: Strength 5/5 bilateral upper and lower extremities  Lymphadenopathy:     Head:     Right side of head: No submental, submandibular or tonsillar adenopathy.     Left side of head: No submental, submandibular or tonsillar adenopathy.  Neurological:     General: No focal deficit present.     Mental Status: She is alert and oriented to person, place, and time.     Cranial Nerves: Cranial nerves 2-12 are intact.     Motor: Motor function is intact.     Coordination: Coordination is intact.     Gait: Gait is intact.     Comments: Cranial nerves II through XII grossly intact.  Psychiatric:        Behavior: Behavior is cooperative.      UC Treatments / Results  Labs (all labs ordered are listed, but only abnormal results are displayed) Labs Reviewed - No data to display  EKG   Radiology No results found.  Procedures Procedures (including critical care time)  Medications Ordered in UC Medications - No data to display  Initial Impression / Assessment and Plan / UC Course  I have reviewed the triage vital signs and the nursing notes.  Pertinent labs & imaging results that were available during my care of the patient were reviewed by me and considered in my medical decision making (see chart for details).     Patient is well-appearing, afebrile, nontoxic, nontachycardic.  We did discuss that given her age, technically she qualifies for a CT scan given her head injury, however, as it has been over 12 hours and she has not developed any symptoms that she ultimately declined this.  X-ray was deferred of hip as she denies any bony bony tenderness and is able to ambulate unassisted.  She was given lidocaine patches to help manage her symptoms.  She can use  over-the-counter medication such as Tylenol or ibuprofen for additional symptom relief.  We discussed fall prevention strategies.  Discussed that if she has any worsening or changing symptoms she should return for reevaluation.  All questions answered to patient satisfaction.  Final Clinical Impressions(s) / UC Diagnoses   Final diagnoses:  Left leg pain  Fall, initial encounter     Discharge Instructions      Your exam was normal.  If anything changes or worsens and you have headache, dizziness, nausea/vomiting, weakness, visual disturbance you need to go to the emergency room.  Use lidocaine patch to help with your left leg pain.  I also recommend over-the-counter medications including Tylenol and ibuprofen.  Follow-up with your primary care next week.  If anything changes or worsens go to the emergency room.     ED Prescriptions     Medication Sig Dispense Auth. Provider   lidocaine (LIDODERM) 5 % Place 1 patch onto the skin daily. Remove & Discard patch within 12 hours or as directed by MD 10 patch Yuto Cajuste, Noberto Retort, PA-C      PDMP not reviewed this encounter.   Jeani Hawking, PA-C 03/10/23 1504

## 2023-03-10 NOTE — Discharge Instructions (Signed)
Your exam was normal.  If anything changes or worsens and you have headache, dizziness, nausea/vomiting, weakness, visual disturbance you need to go to the emergency room.  Use lidocaine patch to help with your left leg pain.  I also recommend over-the-counter medications including Tylenol and ibuprofen.  Follow-up with your primary care next week.  If anything changes or worsens go to the emergency room.

## 2023-03-11 ENCOUNTER — Encounter: Payer: Self-pay | Admitting: Family Medicine

## 2023-03-11 ENCOUNTER — Ambulatory Visit: Payer: Medicare HMO | Admitting: Family Medicine

## 2023-03-11 ENCOUNTER — Telehealth: Payer: Self-pay

## 2023-03-11 ENCOUNTER — Other Ambulatory Visit: Payer: Self-pay

## 2023-03-11 ENCOUNTER — Ambulatory Visit (INDEPENDENT_AMBULATORY_CARE_PROVIDER_SITE_OTHER): Payer: Medicare HMO

## 2023-03-11 VITALS — BP 122/82 | HR 99 | Ht 69.0 in | Wt 294.0 lb

## 2023-03-11 DIAGNOSIS — M25561 Pain in right knee: Secondary | ICD-10-CM

## 2023-03-11 DIAGNOSIS — M170 Bilateral primary osteoarthritis of knee: Secondary | ICD-10-CM | POA: Diagnosis not present

## 2023-03-11 DIAGNOSIS — M25562 Pain in left knee: Secondary | ICD-10-CM

## 2023-03-11 DIAGNOSIS — G8929 Other chronic pain: Secondary | ICD-10-CM

## 2023-03-11 NOTE — Telephone Encounter (Signed)
Rodolph Bong, MD  Dierdre Searles, CMA Please auth gel shots BL knees

## 2023-03-11 NOTE — Progress Notes (Unsigned)
   I, Stevenson Clinch, CMA acting as a Neurosurgeon for Clementeen Graham, MD.  Subjective:    CC: Bilateral knee pain  HPI: Patient is a 67 year old female presenting with bilateral knee pain ongoing, chronic issue.  Patient locates pain to joint line. XR done last year at [facility].   Knee swelling: no Mechanical symptoms: yes Radiates: occasionally Aggravates: sit-to-stand, chronic constant pain, descending stairs Treatments tried:   Pertinent review of Systems: ***  Relevant historical information: ***   Objective:    Vitals:   03/11/23 1354  BP: 122/82  Pulse: 99  SpO2: 98%   General: Well Developed, well nourished, and in no acute distress.   MSK: ***  Lab and Radiology Results No results found for this or any previous visit (from the past 72 hour(s)). No results found.    Impression and Recommendations:    Assessment and Plan: 67 y.o. female with ***.  PDMP not reviewed this encounter. Orders Placed This Encounter  Procedures   [place] LIMITED JOINT SPACE STRUCTURES LOW BILAT(NO LINKED CHARGES)    Order Specific Question:   Reason for Exam (SYMPTOM  OR DIAGNOSIS REQUIRED)    Answer:   bilat knee pain    Order Specific Question:   Preferred imaging location?    Answer:   [facility] Knee AP/LAT W/[place] Left    Standing Status:   Future    Standing Expiration Date:   03/10/2024    Order Specific Question:   Reason for Exam (SYMPTOM  OR DIAGNOSIS REQUIRED)    Answer:   BL knee pain    Order Specific Question:   Preferred imaging location?    Answer:   [facility] Knee AP/LAT W/[place] Right    Standing Status:   Future    Standing Expiration Date:   03/10/2024    Order Specific Question:   Reason for Exam (SYMPTOM  OR DIAGNOSIS REQUIRED)    Answer:   eval knee pain    Order Specific Question:   Preferred imaging location?    Answer:   [facility]   Ambulatory referral to Physical Therapy    Referral  Priority:   Routine    Referral Type:   Physical Medicine    Referral Reason:   Specialty Services Required    Requested Specialty:   Physical Therapy    Number of Visits Requested:   1   No orders of the defined types were placed in this encounter.   Discussed warning signs or symptoms. Please see discharge instructions. Patient expresses understanding.   ***

## 2023-03-11 NOTE — Patient Instructions (Addendum)
Thank you for coming in today.   Please get an Xray today before you leave   I've referred you to Physical Therapy.  Let us know if you don't hear from them in one week.   You should hear soon about Gel shots from [place].   We will get you back soon for those gel shots.

## 2023-03-12 NOTE — Telephone Encounter (Signed)
VOB initiated for [facility] for BILAT knee OA.  

## 2023-03-16 NOTE — Progress Notes (Signed)
Right knee x-ray shows arthritis changes.

## 2023-03-16 NOTE — Progress Notes (Signed)
Left knee x-ray shows arthritis.

## 2023-03-18 NOTE — Telephone Encounter (Signed)
Scheduled

## 2023-03-18 NOTE — Telephone Encounter (Signed)
[facility] for BILAT knee OA  Primary Insurance: [employer] HMO Co-Pay: $10 Co-Insurance: 20% Deductible: does not apply  Prior Auth: NOT required

## 2023-03-24 ENCOUNTER — Ambulatory Visit: Payer: Medicare HMO | Admitting: Family Medicine

## 2023-03-24 NOTE — Telephone Encounter (Signed)
Appt 03/24/23 canceled.

## 2023-04-10 ENCOUNTER — Other Ambulatory Visit: Payer: Self-pay | Admitting: Internal Medicine

## 2023-04-10 DIAGNOSIS — Z1231 Encounter for screening mammogram for malignant neoplasm of breast: Secondary | ICD-10-CM

## 2023-05-04 ENCOUNTER — Other Ambulatory Visit: Payer: Self-pay | Admitting: Internal Medicine

## 2023-05-04 NOTE — Progress Notes (Deleted)
67 y.o. G2P0020 Single Black or African American Not Hispanic or Latino female here for annual exam.      No LMP recorded. Patient is postmenopausal.          Sexually active: {yes no:314532}  The current method of family planning is {contraception:315051}.    Exercising: {yes no:314532}  {types:19826} Smoker:  {YES [id number]  Health Maintenance: Pap: 12/28/19 wnl 08/16/15 Wnl  History of abnormal Pap:  no MMG: 05/19/22 Bi-rads 1 neg  BMD:   01/19/20 normal Colonoscopy: 01/19/20 Normal f/u 5 years TDaP:  unsure Gardasil: n/a    reports that she has never smoked. She has never used smokeless tobacco. She reports that she does not currently use alcohol. She reports that she does not use drugs.  Past Medical History:  Diagnosis Date   Bilateral leg edema    Diastolic dysfunction 2020   cardiologist--- dr berry   Endometrial polyp    GAD (generalized anxiety disorder)    GERD (gastroesophageal reflux disease)    H/O: rheumatic fever    in childhood   Herniated disc    History of anemia    history   History of asthma    Childhood   History of COVID-19 05/2019   admission in epic  acute respiratory failure w/ hypoxmia due to covid   History of endometrial hyperplasia 2010   complex dysplasia  per speciman from d&c hysteroscopy 11-24-2008   Hypertension    no medications at this time   Lichen sclerosus    MDD (major depressive disorder)    Mild obstructive sleep apnea    study in epic 04-06-2010 ,  per pt no recommendation for cpap   Mixed incontinence urge and stress    OA (osteoarthritis)    knees   OAB (overactive bladder)    Pre-diabetes    RBBB (right bundle branch block)    Sciatic leg pain    Uterine leiomyoma    Vocal cord nodule    followed by dr d. Jenne Pane (ENT)---  per pt caused by GERD   Wears glasses     Past Surgical History:  Procedure Laterality Date   COLONOSCOPY     DILATATION & CURETTAGE/HYSTEROSCOPY WITH MYOSURE N/A 10/23/2015   Procedure: DILATATION  & CURETTAGE/HYSTEROSCOPY WITH MYOSURE;  Surgeon: Dara Lords, MD;  Location: [facility];  Service: Gynecology;  Laterality: N/A;   DILATATION & CURETTAGE/HYSTEROSCOPY WITH MYOSURE N/A 04/29/2022   Procedure: DILATATION & CURETTAGE/HYSTEROSCOPY WITH MYOSURE, REPAIR OF VAGNAL LACERATION;  Surgeon: Romualdo Bolk, MD;  Location: [facility];  Service: Gynecology;  Laterality: N/A;   DILATION AND CURETTAGE OF UTERUS     1990s  in [place]   HYSTEROSCOPY WITH D & C  12/04/2008   @[facility]    INTRAUTERINE DEVICE (IUD) INSERTION N/A 04/29/2022   Procedure: Mirena INTRAUTERINE DEVICE (IUD) INSERTION;  Surgeon: Romualdo Bolk, MD;  Location: [facility];  Service: Gynecology;  Laterality: N/A;   IUD REMOVAL N/A 04/29/2022   Procedure: INTRAUTERINE DEVICE (IUD) REMOVAL;  Surgeon: Romualdo Bolk, MD;  Location: [facility];  Service: Gynecology;  Laterality: N/A;   MYOMECTOMY ABDOMINAL APPROACH  1999   TONSILLECTOMY     child    Current Outpatient Medications  Medication Sig Dispense Refill   acetaminophen (TYLENOL) 650 MG CR tablet Take 650 mg by mouth every 8 (eight) hours as needed for pain.     amitriptyline (ELAVIL) 25 MG tablet Take 25 mg by mouth  at bedtime as needed. 25-50mg      Ascorbic Acid (VITAMIN C PO) Take by mouth daily.     Blood Pressure Monitoring (BLOOD PRESSURE CUFF) MISC 1 Package by Does not apply route daily. 1 each 0   carvedilol (COREG) 6.25 MG tablet TAKE 1 TABLET TWICE DAILY WITH MEALS 180 tablet 3   levonorgestrel (MIRENA, 52 MG,) 20 MCG/24HR IUD Mirena 20 mcg/24 hours (7 yrs) 52 mg intrauterine device  Take 1 device by intrauterine route.     lidocaine (LIDODERM) 5 % Place 1 patch onto the skin daily. Remove & Discard patch within 12 hours or as directed by MD 10 patch 0   MAGNESIUM PO Take by mouth daily.     methocarbamol (ROBAXIN) 750 MG tablet Take 750 mg by mouth every 6 (six) hours as needed.     Multiple  Vitamins-Minerals (MULTIVITAMIN ADULTS PO) Take by mouth daily.     Multiple Vitamins-Minerals (ZINC PO) Take by mouth daily.     omeprazole (PRILOSEC) 40 MG capsule Take 40 mg by mouth 2 (two) times daily.     oxybutynin (DITROPAN) 5 MG tablet Take 5 mg by mouth 2 (two) times daily.     polyethylene glycol (MIRALAX / GLYCOLAX) 17 g packet Take 17 g by mouth daily as needed. 14 each 0   spironolactone (ALDACTONE) 25 MG tablet TAKE 1 TABLET EVERY DAY 90 tablet 3   torsemide (DEMADEX) 20 MG tablet Take 1 tablet (20 mg total) by mouth 2 (two) times daily. (Patient taking differently: Take 10-20 mg by mouth daily.) 180 tablet 3   trimethoprim (TRIMPEX) 100 MG tablet Take 100 mg by mouth daily.     No current facility-administered medications for this visit.    Family History  Problem Relation Age of Onset   Diabetes Mother    Hypertension Mother    Diabetes Father    Heart disease Sister    Cancer Sister        Colon   Cancer Brother        Stomach   Hypertension Maternal Grandmother    Diabetes Maternal Grandmother    Polycystic ovary syndrome Neg Hx     Review of Systems  Exam:   There were no vitals taken for this visit.  Weight change: @WEIGHTCHANGE @ Height:      Ht Readings from Last 3 Encounters:  03/11/23 5\' 9"  (1.753 m)  05/13/22 5\' 9"  (1.753 m)  04/29/22 5\' 9"  (1.753 m)    General appearance: alert, cooperative and appears stated age Head: Normocephalic, without obvious abnormality, atraumatic Neck: no adenopathy, supple, symmetrical, trachea midline and thyroid {CHL AMB PHY EX THYROID NORM DEFAULT:[phone]::"normal to inspection and palpation"} Lungs: clear to auscultation bilaterally Cardiovascular: regular rate and rhythm Breasts: {Exam; breast:13139::"normal appearance, no masses or tenderness"} Abdomen: soft, non-tender; non distended,  no masses,  no organomegaly Extremities: extremities normal, atraumatic, no cyanosis or edema Skin: Skin color, texture,  turgor normal. No rashes or lesions Lymph nodes: Cervical, supraclavicular, and axillary nodes normal. No abnormal inguinal nodes palpated Neurologic: Grossly normal   Pelvic: External genitalia:  no lesions              Urethra:  normal appearing urethra with no masses, tenderness or lesions              Bartholins and Skenes: normal                 Vagina: normal appearing vagina with normal color and discharge, no  lesions              Cervix: {CHL AMB PHY EX CERVIX NORM DEFAULT:[phone]::"no lesions"}               Bimanual Exam:  Uterus:  {CHL AMB PHY EX UTERUS NORM DEFAULT:[phone]::"normal size, contour, position, consistency, mobility, non-tender"}              Adnexa: {CHL AMB PHY EX ADNEXA NO MASS DEFAULT:[phone]::"no mass, fullness, tenderness"}               Rectovaginal: Confirms               Anus:  normal sphincter tone, no lesions  *** chaperoned for the exam.  A:  Well Woman with normal exam  P:

## 2023-05-06 LAB — SARS-COV-2 RNA,(COVID-19) QUALITATIVE NAAT: SARS CoV2 RNA: NOT DETECTED

## 2023-05-07 ENCOUNTER — Ambulatory Visit: Payer: Medicare HMO | Admitting: Obstetrics and Gynecology

## 2023-05-22 ENCOUNTER — Ambulatory Visit: Payer: Medicare HMO

## 2023-05-25 ENCOUNTER — Ambulatory Visit: Payer: Medicare HMO

## 2023-05-25 DIAGNOSIS — Z1231 Encounter for screening mammogram for malignant neoplasm of breast: Secondary | ICD-10-CM

## 2023-06-18 ENCOUNTER — Other Ambulatory Visit: Payer: Self-pay | Admitting: Internal Medicine

## 2023-06-25 ENCOUNTER — Ambulatory Visit: Payer: Medicare HMO | Admitting: Radiology

## 2023-07-08 ENCOUNTER — Ambulatory Visit: Payer: Medicare HMO | Admitting: Family Medicine

## 2023-07-09 ENCOUNTER — Encounter: Payer: Self-pay | Admitting: Family Medicine

## 2023-07-09 ENCOUNTER — Ambulatory Visit: Payer: Medicare HMO | Admitting: Family Medicine

## 2023-07-09 ENCOUNTER — Other Ambulatory Visit: Payer: Self-pay

## 2023-07-09 VITALS — BP 112/78 | HR 88 | Ht 69.0 in | Wt 307.0 lb

## 2023-07-09 DIAGNOSIS — G8929 Other chronic pain: Secondary | ICD-10-CM

## 2023-07-09 DIAGNOSIS — M170 Bilateral primary osteoarthritis of knee: Secondary | ICD-10-CM

## 2023-07-09 DIAGNOSIS — M25562 Pain in left knee: Secondary | ICD-10-CM | POA: Diagnosis not present

## 2023-07-09 DIAGNOSIS — M25561 Pain in right knee: Secondary | ICD-10-CM

## 2023-07-09 MED ORDER — HYALURONAN 30 MG/2ML IX SOSY: 30.0000 mg | PREFILLED_SYRINGE | Freq: Once | INTRA_ARTICULAR | Status: DC

## 2023-07-09 NOTE — Patient Instructions (Signed)
Thank you for coming in today.   I've referred you to Interventional Radiology.  Let us know if you don't hear from them in one week.

## 2023-07-09 NOTE — Progress Notes (Signed)
   I, Stevenson Clinch, CMA acting as a scribe for Kristen Graham, MD.  Kristen Shaffer is a 67 y.o. female who presents to [facility] at [facility] today for cont'd bilat knee pain. Pt was last seen by Dr. Denyse Amass on 03/11/23 and was advised to use Voltaren gel, Tylenol, while we work to authorize Therapist, music. Pt was also referred to [facility] PT. Pt canceled her f/u visit previously scheduled in May.  Cost is a big factor for her.  She will have to pay 20% of the total cost of Orthovisc which may be a bit steep for her.  Today, pt reports continued knee pain, L>R.   Dx imaging: 03/11/23 R & L knee XR  Pertinent review of systems: No fevers or chills  Relevant historical information: Hypertension Obesity.    Exam:  BP 112/78   Pulse 88   Ht 5\' 9"  (1.753 m)   Wt (!) 307 lb (139.3 kg)   SpO2 99%   BMI 45.34 kg/m  General: Well Developed, well nourished, and in no acute distress.   MSK: Left knee moderate effusion normal motion. Right knee moderate effusion normal motion. Using a cane to ambulate with antalgic gait.     Assessment and Plan: 67 y.o. female with chronic bilateral knee pain due to DJD.  Left knee is worse than right.  She is very cost sensitive which is reasonable.  I think if were looking for a more definitive option outside of knee replacement geniculate artery embolization may be a better option for her.  I am not super optimistic about gel shots and she is likely going to need something more definitive.  She is too heavy right now with a BMI of 45 for knee replacement.  Consider geniculate artery embolization.   PDMP not reviewed this encounter. Orders Placed This Encounter  Procedures   [place] LIMITED JOINT SPACE STRUCTURES LOW LEFT(NO LINKED CHARGES)    Order Specific Question:   Reason for Exam (SYMPTOM  OR DIAGNOSIS REQUIRED)    Answer:   left knee pain    Order Specific Question:   Preferred imaging location?    Answer:   [facility] Radi[facility]    Standing Status:   Future    Standing Expiration Date:   07/08/2024    Scheduling Instructions:     Genicular Artery Embolization    Order Specific Question:   Reason for Exam (SYMPTOM  OR DIAGNOSIS REQUIRED)    Answer:   bilateral knee pain    Order Specific Question:   Preferred imaging location?    Answer:   [id number]   Meds ordered this encounter  Medications   DISCONTD: Hyaluronan (ORTHOVISC) intra-articular injection 30 mg     Discussed warning signs or symptoms. Please see discharge instructions. Patient expresses understanding.   The above documentation has been reviewed and is accurate and complete Kristen Shaffer, M.D.

## 2023-07-21 NOTE — Telephone Encounter (Signed)
Dierdre Searles, CMA  Stevenson Clinch S, [place] Pt has new [employer] ins 9/1. Re-run benefits for upcoming Orthovisc injection.

## 2023-07-21 NOTE — Telephone Encounter (Signed)
 VOB initiated for ORTHOVISC for BILAT knee OA.

## 2023-07-27 NOTE — Telephone Encounter (Signed)
Never received VOB, re-submitted.

## 2023-07-28 NOTE — Telephone Encounter (Signed)
Unable to view benefits form via [employer] portal.

## 2023-07-29 NOTE — Telephone Encounter (Signed)
 NO Prior Auth required for [employer]

## 2023-07-29 NOTE — Telephone Encounter (Signed)
Orthovisc for BILAT knee OA   Primary Insurance: [employer] Medicare HMO Co-Pay: $10 Co-Insurance: 20% Deductible: does not apply   Prior Auth: NOT required   Knee Injection History:

## 2023-07-30 NOTE — Telephone Encounter (Signed)
Left message for patient to call back to schedule.  °

## 2023-08-26 ENCOUNTER — Other Ambulatory Visit (HOSPITAL_COMMUNITY)
Admission: RE | Admit: 2023-08-26 | Discharge: 2023-08-26 | Disposition: A | Discharge status: Home / Self Care | Payer: Medicare HMO | Source: Ambulatory Visit | Attending: Radiology | Admitting: Radiology

## 2023-08-26 ENCOUNTER — Ambulatory Visit (INDEPENDENT_AMBULATORY_CARE_PROVIDER_SITE_OTHER): Payer: Medicare HMO | Admitting: Radiology

## 2023-08-26 ENCOUNTER — Encounter: Payer: Self-pay | Admitting: Radiology

## 2023-08-26 VITALS — BP 104/70 | Ht 66.0 in | Wt 301.0 lb

## 2023-08-26 DIAGNOSIS — Z01419 Encounter for gynecological examination (general) (routine) without abnormal findings: Secondary | ICD-10-CM

## 2023-08-26 DIAGNOSIS — Z124 Encounter for screening for malignant neoplasm of cervix: Secondary | ICD-10-CM | POA: Diagnosis not present

## 2023-08-26 DIAGNOSIS — Z1151 Encounter for screening for human papillomavirus (HPV): Secondary | ICD-10-CM | POA: Insufficient documentation

## 2023-08-26 DIAGNOSIS — Z9189 Other specified personal risk factors, not elsewhere classified: Secondary | ICD-10-CM | POA: Diagnosis not present

## 2023-08-26 DIAGNOSIS — N8500 Endometrial hyperplasia, unspecified: Secondary | ICD-10-CM | POA: Diagnosis not present

## 2023-08-26 NOTE — Progress Notes (Signed)
Kristen Shaffer 1955-12-20 [id number]   History: Postmenopausal 67 y.o. presents for annual exam. Hx of endometrial hyperplasia, Mirena inserted 2023, no further bleeding. Doing well gyn wise. No concerns. Has a PCP who manages her other health concerns.   Gynecologic History Postmenopausal Last Pap: 2021. Results were: normal Last mammogram: 2024. Results were: normal Last colonoscopy: 2021 DEXA:2021 normal repeat 2026   Obstetric History OB History  Gravida Para Term Preterm AB Living  2       2 0  SAB IAB Ectopic Multiple Live Births               # Outcome Date GA Lbr Len/2nd Weight Sex Type Anes PTL Lv  2 AB           1 AB              The following portions of the patient's history were reviewed and updated as appropriate: allergies, current medications, past family history, past medical history, past social history, past surgical history, and problem list.  Review of Systems Pertinent items noted in HPI and remainder of comprehensive ROS otherwise negative.  Past medical history, past surgical history, family history and social history were all reviewed and documented in the EPIC chart.  Exam:  Vitals:   08/26/23 1100  BP: 104/70  Weight: (!) 301 lb (136.5 kg)  Height: 5\' 6"  (1.676 m)   Body mass index is 48.58 kg/m.  General appearance:  NAD, morbidly obese Thyroid:  Symmetrical, normal in size, without palpable masses or nodularity. Respiratory  Auscultation:  Clear without wheezing or rhonchi Cardiovascular  Auscultation:  Regular rate, without rubs, murmurs or gallops  Edema/varicosities:  Not grossly evident Abdominal  Soft,nontender, without masses, guarding or rebound.  Liver/spleen:  No organomegaly noted  Hernia:  None appreciated  Skin  Inspection:  Grossly normal Breasts: Examined lying and sitting.   Right: Without masses, retractions, nipple discharge or axillary adenopathy.   Left: Without masses, retractions, nipple discharge or  axillary adenopathy. Genitourinary   Inguinal/mons:  Normal without inguinal adenopathy  External genitalia:  +vitiligo on labia, buttocks and groin. Otherwise normal appearing vulva with no masses, tenderness, or lesions  BUS/Urethra/Skene's glands:  Normal  Vagina:  Normal appearing with normal color and discharge, no lesions. Atrophy: mild   Cervix:  Normal appearing without discharge or lesions. Strings seen about 3-4cm from os  Uterus:  Normal in size, shape and contour.  Midline and mobile, nontender  Adnexa/parametria:     Rt: Normal in size, without masses or tenderness.   Lt: Normal in size, without masses or tenderness.      Raynelle Fanning, CMA present for exam  Assessment/Plan:   1. Well woman exam with routine gynecological exam - Cytology - PAP( [place]) -IUD in place for endometrial protection may remain until 2031    Discussed SBE, colonoscopy and DEXA screening as directed. Recommend of exercise weekly, including weight bearing exercise. Encouraged the use of seatbelts and sunscreen.  Return in 1 year for annual (high risk medicare) or sooner prn.  Arlie Solomons B WHNP-BC, 11:29 AM 08/26/2023

## 2023-08-28 LAB — CYTOLOGY - PAP
Adequacy: ABSENT
Comment: NEGATIVE
Diagnosis: NEGATIVE
High risk HPV: NEGATIVE

## 2023-09-03 ENCOUNTER — Other Ambulatory Visit: Payer: Self-pay | Admitting: *Deleted

## 2023-09-03 MED ORDER — FLUCONAZOLE 150 MG PO TABS: 150.0000 mg | ORAL_TABLET | Freq: Once | ORAL | 0 refills | Status: AC

## 2023-09-30 NOTE — Progress Notes (Signed)
Chief Complaint: Patient was seen in virtual telephone consultation today for bilateral knee pain  Referring Physician(s): Corey,Evan S  History of Present Illness: Kristen Shaffer is a 67 y.o. female with a medical history significant for heart failure, GAD/MDD,  HTN, morbid obesity and bilateral knee osteoarthritis. She ambulates with a cane and was referred to Dr. Denyse Amass earlier this year for bilateral knee pain treatment options. She's had prior hyaluronic injections which worked well at the time. Steroid injections have not provided much relief. When she met with Dr. Denyse Amass in April they discussed knee replacements but her weight prohibits her from qualifying for surgery.  She has been treated conservatively since then with OTC medications - Tylenol and voltaren gel.  She was referred for physical therapy and unfortunately her co-pay for OrthoVisc injections was too high. Dr. Denyse Amass has kindly referred this patient to Interventional Radiology for possible geniculate artery embolization. She presents today via virtual tele-health visit for further discussion.  She has been visiting a chiropractor for knee adjustments which helps some but is painful during the adjustment.  Left is worst than the right.  She takes tylenol arthritis medication which provides some minimal relief.  She has not had good relief from narcotics or celebrex in the past.  She uses a walker/cane to ambulate.    Womac Pain Score = 58/96 VAS Pain Score = 7/10  Past Medical History:  Diagnosis Date   Bilateral leg edema    Diastolic dysfunction 2020   cardiologist--- dr berry   Endometrial polyp    GAD (generalized anxiety disorder)    GERD (gastroesophageal reflux disease)    H/O: rheumatic fever    in childhood   Herniated disc    History of anemia    history   History of asthma    Childhood   History of COVID-19 05/2019   admission in epic  acute respiratory failure w/ hypoxmia due to covid   History of  endometrial hyperplasia 2010   complex dysplasia  per speciman from d&c hysteroscopy 11-24-2008   Hypertension    no medications at this time   Lichen sclerosus    MDD (major depressive disorder)    Mild obstructive sleep apnea    study in epic 04-06-2010 ,  per pt no recommendation for cpap   Mixed incontinence urge and stress    OA (osteoarthritis)    knees   OAB (overactive bladder)    Pre-diabetes    RBBB (right bundle branch block)    Sciatic leg pain    Uterine leiomyoma    Vocal cord nodule    followed by dr d. Jenne Pane (ENT)---  per pt caused by GERD   Wears glasses     Past Surgical History:  Procedure Laterality Date   COLONOSCOPY     DILATATION & CURETTAGE/HYSTEROSCOPY WITH MYOSURE N/A 10/23/2015   Procedure: DILATATION & CURETTAGE/HYSTEROSCOPY WITH MYOSURE;  Surgeon: Dara Lords, MD;  Location: [facility];  Service: Gynecology;  Laterality: N/A;   DILATATION & CURETTAGE/HYSTEROSCOPY WITH MYOSURE N/A 04/29/2022   Procedure: DILATATION & CURETTAGE/HYSTEROSCOPY WITH MYOSURE, REPAIR OF VAGNAL LACERATION;  Surgeon: Romualdo Bolk, MD;  Location: [facility];  Service: Gynecology;  Laterality: N/A;   DILATION AND CURETTAGE OF UTERUS     1990s  in [place]   HYSTEROSCOPY WITH D & C  12/04/2008   @[facility]    INTRAUTERINE DEVICE (IUD) INSERTION N/A 04/29/2022   Procedure: Mirena INTRAUTERINE DEVICE (IUD) INSERTION;  Surgeon: Oscar La,  Craig Guess, MD;  Location: [facility];  Service: Gynecology;  Laterality: N/A;   IUD REMOVAL N/A 04/29/2022   Procedure: INTRAUTERINE DEVICE (IUD) REMOVAL;  Surgeon: Romualdo Bolk, MD;  Location: [facility];  Service: Gynecology;  Laterality: N/A;   MYOMECTOMY ABDOMINAL APPROACH  1999   TONSILLECTOMY     child    Allergies: Patient has no known allergies.  Medications: Prior to Admission medications   Medication Sig Start Date End Date Taking? Authorizing Provider  acetaminophen  (TYLENOL) 650 MG CR tablet Take 650 mg by mouth every 8 (eight) hours as needed for pain. Patient not taking: Reported on 08/26/2023    [provider]  amitriptyline (ELAVIL) 25 MG tablet Take 25 mg by mouth at bedtime as needed. 25-50mg  Patient not taking: Reported on 08/26/2023    [provider]  Ascorbic Acid (VITAMIN C PO) Take by mouth daily.    [provider]  Blood Glucose Monitoring Suppl (TRUE METRIX AIR GLUCOSE METER) w/Device KIT  08/21/23   [provider]  Blood Pressure Monitoring (BLOOD PRESSURE CUFF) MISC 1 Package by Does not apply route daily. 08/29/19   Runell Gess, MD  carvedilol (COREG) 6.25 MG tablet TAKE 1 TABLET TWICE DAILY WITH MEALS 11/11/22   Runell Gess, MD  celecoxib (CELEBREX) 100 MG capsule Take 100 mg by mouth daily.    [provider]  clobetasol cream (TEMOVATE) 0.05 % APPLY A THIN LAYER TO THE AFFECTED AREA(S) BY TOPICAL ROUTE 2 TIMES PER DAY 02/13/16   [provider]  gabapentin (NEURONTIN) 100 MG capsule Oral for 30 Days    [provider]  levonorgestrel (MIRENA, 52 MG,) 20 MCG/24HR IUD Mirena 20 mcg/24 hours (7 yrs) 52 mg intrauterine device  Take 1 device by intrauterine route.    [provider]  MAGNESIUM PO Take by mouth daily. Patient not taking: Reported on 08/26/2023    [provider]  methocarbamol (ROBAXIN) 750 MG tablet Take 750 mg by mouth every 6 (six) hours as needed.    [provider]  MOUNJARO 7.5 MG/0.5ML Pen Inject into the skin. Patient not taking: Reported on 08/26/2023    [provider]  Multiple Vitamins-Minerals (MULTIVITAMIN ADULTS PO) Take by mouth daily.    [provider]  omeprazole (PRILOSEC) 40 MG capsule Take 40 mg by mouth 2 (two) times daily. 03/18/21   [provider]  oxybutynin (DITROPAN) 5 MG tablet Take 5 mg by mouth 2 (two) times daily. 06/05/21   [provider]  spironolactone  (ALDACTONE) 25 MG tablet TAKE 1 TABLET EVERY DAY 11/11/22   Runell Gess, MD  torsemide (DEMADEX) 20 MG tablet Take 1 tablet (20 mg total) by mouth 2 (two) times daily. Patient taking differently: Take 10-20 mg by mouth daily. 08/24/19 04/29/22  Runell Gess, MD  trimethoprim (TRIMPEX) 100 MG tablet Take 100 mg by mouth daily.    [provider]     Family History  Problem Relation Age of Onset   Diabetes Mother    Hypertension Mother    Diabetes Father    Heart disease Sister    Cancer Sister        Colon   Cancer Brother        Stomach   Hypertension Maternal Grandmother    Diabetes Maternal Grandmother    Polycystic ovary syndrome Neg Hx     Social History   Socioeconomic History   Marital status:  Single    Spouse name: Not on file   Number of children: Not on file   Years of education: Not on file   Highest education level: Not on file  Occupational History   Not on file  Tobacco Use   Smoking status: Never    Passive exposure: Never   Smokeless tobacco: Never  Vaping Use   Vaping status: Never Used  Substance and Sexual Activity   Alcohol use: Not Currently    Comment: seldom   Drug use: Never   Sexual activity: Not Currently    Partners: Male    Birth control/protection: Post-menopausal    Comment: 1st intercourse 67 yo- more than 5 lifetime partners-Mirena 2023  Other Topics Concern   Not on file  Social History Narrative   Not on file   Social Determinants of Health   Financial Resource Strain: Not on file  Food Insecurity: Not on file  Transportation Needs: Not on file  Physical Activity: Not on file  Stress: Not on file  Social Connections: Not on file    Review of Systems: A 12 point ROS discussed and pertinent positives are indicated in the HPI above.  All other systems are negative.  Vital Signs: There were no vitals taken for this visit.  Advance Care Plan: The advanced care plan/surrogate decision maker was discussed at  the time of visit and documented in the medical record.   No physical exam was performed in lieu of virtual telephone visit.   Imaging: Right knee (07/11/23)   Left knee (07/11/23)  Kellgren and Lawrence Grade 3 bilaterally  Labs:  CBC: Recent Labs    06/18/23 0000  WBC 11.7*  HGB 13.4  HCT 41.5  PLT 327    COAGS: No results for input(s): "INR", "APTT" in the last 8760 hours.  BMP: Recent Labs    06/18/23 0000  NA 138  K 4.0  CL 102  CO2 22  GLUCOSE 110*  BUN 22  CALCIUM 9.5  CREATININE 1.35*    LIVER FUNCTION TESTS: Recent Labs    06/18/23 0000  BILITOT 0.5  AST 22  ALT 20  PROT 6.8    TUMOR MARKERS: No results for input(s): "AFPTM", "CEA", "CA199", "CHROMGRNA" in the last 8760 hours.  Assessment and Plan: 67 year old female with a history of advanced bilateral knee osteoarthritis (K&G 3) with severe, left greater than right, bilateral chronic pain (WOMAC 58/96, 7/10) refractory to multiple conservative treatments.  She would be an excellent candidate for geniculate artery embolization.  We discussed the rationale and periprocedural expectations including risks and benefits and she would like to proceed with treating the left knee first.  Plan for left geniculate artery embolization with moderate sedation at [facility].     Marliss Coots, MD Pager: [phone]    I spent a total of  30 Minutes   in virtual telephone clinical consultation, greater than 50% of which was counseling/coordinating care for bilateral knee pain.

## 2023-10-02 ENCOUNTER — Ambulatory Visit
Admission: RE | Admit: 2023-10-02 | Discharge: 2023-10-02 | Disposition: A | Discharge status: Home / Self Care | Payer: Medicare HMO | Source: Ambulatory Visit | Attending: Family Medicine | Admitting: Family Medicine

## 2023-10-02 DIAGNOSIS — M170 Bilateral primary osteoarthritis of knee: Secondary | ICD-10-CM

## 2023-10-02 DIAGNOSIS — G8929 Other chronic pain: Secondary | ICD-10-CM

## 2023-10-02 HISTORY — PX: IR RADIOLOGIST EVAL & MGMT: IMG5224

## 2023-10-19 NOTE — Progress Notes (Unsigned)
Cardiology Office Note:  .   Date:  10/20/2023  ID:  [name], DOB 06-30-1956, MRN [id number] PCP: Fleet Contras, MD  [facility] Providers Cardiologist:  Nanetta Batty, MD }   History of Present Illness: Kristen Shaffer   Kristen Shaffer is a 67 y.o. female with a h/o HTN, diastolic dysfunction, GERD, chronic pain.  Last encounter was with Robin Searing, NP, for preoperative clearance to have hysterectomy on 04/16/2022.  She is status post geniculate artery embolization per interventional radiology to her knees.  She comes today speaking at length about all of her medical problems.  She also suffers from depression and has missed office visits as a result.  She complains about significant weight gain of 30 pounds while being placed on antidepressant medication.  She also complains of insomnia and chronic dyspnea on exertion.  She denies any chest pain or palpitations.  She is medically compliant.  ROS: As above otherwise negative.  Studies Reviewed: .     Echocardiogram 07/18/2019  1. The left ventricle has normal systolic function, with an ejection  fraction of 55-60%. The cavity size was normal. There is mildly increased  left ventricular wall thickness. Left ventricular diastolic Doppler  parameters are consistent with impaired  relaxation. No evidence of left ventricular regional wall motion  abnormalities.   2. The aortic root is normal in size and structure.   3. The aortic valve is tricuspid. Mild calcification of the aortic valve.  No stenosis of the aortic valve.   4. The right ventricle has normal systolic function. The cavity was  normal. There is no increase in right ventricular wall thickness.   5. Trivial pericardial effusion is present.   6. No evidence of mitral valve stenosis. No mitral regurgitation.   7. The IVC was poorly visualized. Peak RV-RA gradient 26 mmHg.   8. Technically difficult study with poor acoustic windows.    EKG  Interpretation Date/Time:  Tuesday October 20 2023 15:19:42 EST Ventricular Rate:  89 PR Interval:  152 QRS Duration:  126 QT Interval:  414 QTC Calculation: 503 R Axis:   -13  Text Interpretation: Normal sinus rhythm Right bundle branch block When compared with ECG of 27-Mar-2022 13:44, PREVIOUS ECG IS PRESENT Confirmed by Joni Reining [phone]) on 10/20/2023 3:22:45 PM    Physical Exam:   VS:  BP 112/78 (BP Location: Right Arm, Patient Position: Sitting, Cuff Size: Large)   Pulse 89   Ht 5\' 10"  (1.778 m)   Wt (!) 304 lb (137.9 kg)   SpO2 98%   BMI 43.62 kg/m    Wt Readings from Last 3 Encounters:  10/20/23 (!) 304 lb (137.9 kg)  08/26/23 (!) 301 lb (136.5 kg)  07/09/23 (!) 307 lb (139.3 kg)    GEN: Well nourished, well developed in no acute distress NECK: No JVD; No carotid bruits CARDIAC: RRR, distant heart sounds, no murmurs, rubs, gallops RESPIRATORY:  Clear to auscultation without rales, wheezing or rhonchi  ABDOMEN: Soft, non-tender, non-distended EXTREMITIES:  No edema; No deformity   ASSESSMENT AND PLAN: .    Hypertension: Currently well-controlled.  Continue carvedilol, and spironolactone as directed.  Labs are followed by PCP.  Repeating echocardiogram as one is not been completed since 2020 to evaluate for changes in LV function especially with weight gain and dyspnea.  2.  Chronic diastolic CHF: Remains on spironolactone and torsemide.  She has gained 30 pounds and therefore we are uncertain of her dry weight.  She does not  appear to be volume overloaded on this office visit.  3.  Insomnia with dyspnea on exertion: This may be due to severe obesity and/or OSA or both, she dose have daytime somnolence I am going to plan a sleep study to evaluate further, to assess for medical management if necessary.   Signed, Bettey Mare. Liborio Nixon, ANP, AACC

## 2023-10-20 ENCOUNTER — Ambulatory Visit: Payer: Medicare HMO | Attending: Adult Health | Admitting: Adult Health

## 2023-10-20 ENCOUNTER — Encounter: Payer: Self-pay | Admitting: Adult Health

## 2023-10-20 VITALS — BP 112/78 | HR 89 | Ht 70.0 in | Wt 304.0 lb

## 2023-10-20 DIAGNOSIS — I10 Essential (primary) hypertension: Secondary | ICD-10-CM

## 2023-10-20 DIAGNOSIS — G4700 Insomnia, unspecified: Secondary | ICD-10-CM | POA: Diagnosis not present

## 2023-10-20 DIAGNOSIS — R400 Somnolence: Secondary | ICD-10-CM | POA: Diagnosis not present

## 2023-10-20 NOTE — Patient Instructions (Signed)
Medication Instructions:  No Changes *If you need a refill on your cardiac medications before your next appointment, please call your pharmacy*   Lab Work: No Labs If you have labs (blood work) drawn today and your tests are completely normal, you will receive your results only by: MyChart Message (if you have MyChart) OR A paper copy in the mail If you have any lab test that is abnormal or we need to change your treatment, we will call you to review the results.   Testing/Procedures: [address], [address]. Your physician has requested that you have an echocardiogram. Echocardiography is a painless test that uses sound waves to create images of your heart. It provides your doctor with information about the size and shape of your heart and how well your heart's chambers and valves are working. This procedure takes approximately one hour. There are no restrictions for this procedure. Please do NOT wear cologne, perfume, aftershave, or lotions (deodorant is allowed). Please arrive 15 minutes prior to your appointment time.  Please note: We ask at that you not bring children with you during ultrasound (echo/ vascular) testing. Due to room size and safety concerns, children are not allowed in the ultrasound rooms during exams. Our front office staff cannot provide observation of children in our lobby area while testing is being conducted. An adult accompanying a patient to their appointment will only be allowed in the ultrasound room at the discretion of the ultrasound technician under special circumstances. We apologize for any inconvenience.    [facility] Your physician has recommended that you have a sleep study. This test records several body functions during sleep, including: brain activity, eye movement, oxygen and carbon dioxide blood levels, heart rate and rhythm, breathing rate and rhythm, the flow of air through your mouth and nose, snoring, body muscle movements,  and chest and belly movement.   Follow-Up: At [facility], you and your health needs are our priority.  As part of our continuing mission to provide you with exceptional heart care, we have created designated Provider Care Teams.  These Care Teams include your primary Cardiologist (physician) and Advanced Practice Providers (APPs -  Physician Assistants and Nurse Practitioners) who all work together to provide you with the care you need, when you need it.  We recommend signing up for the patient portal called "MyChart".  Sign up information is provided on this After Visit Summary.  MyChart is used to connect with patients for Virtual Visits (Telemedicine).  Patients are able to view lab/test results, encounter notes, upcoming appointments, etc.  Non-urgent messages can be sent to your provider as well.   To learn more about what you can do with MyChart, go to [email or web address].    Your next appointment:   6 month(s)  Provider:   Nanetta Batty, MD

## 2023-11-19 NOTE — Telephone Encounter (Signed)
 VOB initiated for 2025 coverage of Orthovisc.

## 2023-11-23 ENCOUNTER — Ambulatory Visit (HOSPITAL_COMMUNITY): Payer: Medicare HMO

## 2023-12-04 NOTE — Telephone Encounter (Signed)
Orthovisc for BILAT knee OA   [employer]  Primary Insurance: [employer] Medicare Adv Co-pay: $15 Co-insurance: undisclosed Deductible: does not apply Prior Auth: NOT required   Knee Injection History

## 2023-12-04 NOTE — Telephone Encounter (Signed)
 [employer] - Prior Authorization NOT required

## 2023-12-08 ENCOUNTER — Other Ambulatory Visit: Payer: Self-pay | Admitting: Cardiovascular Disease

## 2023-12-17 ENCOUNTER — Ambulatory Visit (HOSPITAL_COMMUNITY): Admission: RE | Admit: 2023-12-17 | Payer: Medicare HMO | Source: Ambulatory Visit

## 2024-01-20 ENCOUNTER — Ambulatory Visit (HOSPITAL_COMMUNITY): Admission: RE | Admit: 2024-01-20 | Payer: Medicare HMO | Source: Ambulatory Visit

## 2024-02-19 ENCOUNTER — Ambulatory Visit (HOSPITAL_COMMUNITY)
Admission: RE | Admit: 2024-02-19 | Discharge: 2024-02-19 | Disposition: A | Discharge status: Home / Self Care | Source: Ambulatory Visit | Attending: Adult Health | Admitting: Adult Health

## 2024-02-19 DIAGNOSIS — I10 Essential (primary) hypertension: Secondary | ICD-10-CM | POA: Diagnosis present

## 2024-02-19 LAB — ECHOCARDIOGRAM COMPLETE
AR max vel: 1.82 cm2
AV Area VTI: 2.05 cm2
AV Area mean vel: 1.8 cm2
AV Mean grad: 3 mmHg
AV Peak grad: 5.8 mmHg
Ao pk vel: 1.2 m/s
Area-P 1/2: 2.91 cm2
S' Lateral: 3.36 cm

## 2024-02-24 ENCOUNTER — Telehealth: Payer: Self-pay | Admitting: Cardiovascular Disease

## 2024-02-24 NOTE — Telephone Encounter (Signed)
 Called pt, went over echo results. No further questions at this time.

## 2024-02-24 NOTE — Telephone Encounter (Signed)
 Patient calling in regards to her echo results. Please advise

## 2024-02-26 ENCOUNTER — Telehealth: Payer: Self-pay

## 2024-02-26 NOTE — Telephone Encounter (Addendum)
 Results seen by patient via MyChart.----- Message from Joylene Grapes sent at 02/23/2024  3:51 PM EDT ----- Covering Kathryn's inbox.  Recent echocardiogram showed overall normal heart pumping function, there is mild thickness and stiffness of the left bottom chamber of the heart, a common age-related finding, there were no significant valvular abnormalities.  Continue current medications and follow-up as planned.  Thank you-EM

## 2024-04-07 ENCOUNTER — Encounter: Payer: Self-pay | Admitting: Cardiovascular Disease

## 2024-07-19 NOTE — Telephone Encounter (Signed)
 Referred for GAE

## 2024-08-16 ENCOUNTER — Other Ambulatory Visit: Payer: Self-pay | Admitting: Internal Medicine

## 2024-08-16 DIAGNOSIS — Z1231 Encounter for screening mammogram for malignant neoplasm of breast: Secondary | ICD-10-CM

## 2024-08-22 ENCOUNTER — Ambulatory Visit: Admitting: Cardiovascular Disease

## 2024-08-24 ENCOUNTER — Ambulatory Visit: Attending: Cardiology | Admitting: Cardiovascular Disease

## 2024-08-24 ENCOUNTER — Encounter: Payer: Self-pay | Admitting: Cardiovascular Disease

## 2024-08-24 VITALS — BP 111/75 | HR 78 | Resp 16 | Ht 70.0 in | Wt 282.6 lb

## 2024-08-24 DIAGNOSIS — I10 Essential (primary) hypertension: Secondary | ICD-10-CM | POA: Diagnosis not present

## 2024-08-24 DIAGNOSIS — I5189 Other ill-defined heart diseases: Secondary | ICD-10-CM | POA: Insufficient documentation

## 2024-08-24 NOTE — Progress Notes (Signed)
 08/24/2024 Kristen Shaffer   [birth year]  [id number]  Primary Physician Shelda Atlas, MD Primary Cardiologist: Dorn JINNY Lesches MD [facility] [place], [place]  HPI:  Kristen Shaffer is a 68 y.o.   morbidly overweight single African-American female with no biologic children who has been disabled since age 32.  She was a Psychologist, forensic in [place] .  I last saw her in the office 06/26/2021.  She has chronic pain because of her back as well as long bouts of depression.  She does have a history of hypertension.  She is never had a heart tach or stroke.  She has had GERD as well.  She was hospitalized in August because of UTI and lower extreme edema.  She is found to be COVID positive.  She was treated with diuretics.  2D echo revealed normal LV systolic function with diastolic dysfunction.  I last saw her in the office 08/01/2020.   She has been seen in the weight loss clinic and is slowly losing weight.  Her diet has been modified.  She is been seeing our Pharm.D.'s in the office for blood pressure monitoring and medication adjustments.  Blood pressures have been under better control.  She feels clinically improved.  I did ask her questions regarding sleep apnea and apparently she is been tested in the past and does not have this clinically.  Since I saw her in the office 3 years ago her weight has remained stable.  She does walk with a cane.  She is chronically short of breath probably from deconditioning and morbid obesity.  She denies chest pain.     Current Meds  Medication Sig   acetaminophen  (TYLENOL ) 650 MG CR tablet Take 650 mg by mouth every 8 (eight) hours as needed for pain.   amitriptyline (ELAVIL) 25 MG tablet Take 25 mg by mouth at bedtime as needed. 25-50mg    Ascorbic Acid  (VITAMIN C  PO) Take by mouth daily.   Blood Glucose Monitoring Suppl (TRUE METRIX AIR GLUCOSE METER) w/Device KIT    Blood Pressure Monitoring (BLOOD PRESSURE CUFF) MISC 1 Package by Does not apply route daily.    carvedilol  (COREG ) 6.25 MG tablet TAKE 1 TABLET TWICE DAILY WITH MEALS   celecoxib (CELEBREX) 100 MG capsule Take 100 mg by mouth daily.   clobetasol  cream (TEMOVATE ) 0.05 % APPLY A THIN LAYER TO THE AFFECTED AREA(S) BY TOPICAL ROUTE 2 TIMES PER DAY   gabapentin  (NEURONTIN ) 100 MG capsule Oral for 30 Days (Patient taking differently: Take 100 mg by mouth as needed.)   levonorgestrel  (MIRENA , 52 MG,) 20 MCG/24HR IUD Mirena  20 mcg/24 hours (7 yrs) 52 mg intrauterine device  Take 1 device by intrauterine route.   MAGNESIUM  PO Take by mouth daily.   methocarbamol (ROBAXIN) 750 MG tablet Take 750 mg by mouth every 6 (six) hours as needed.   MOUNJARO 7.5 MG/0.5ML Pen Inject into the skin.   Multiple Vitamins-Minerals (MULTIVITAMIN ADULTS PO) Take by mouth daily.   omeprazole (PRILOSEC) 40 MG capsule Take 40 mg by mouth 2 (two) times daily. (Patient taking differently: Take 40 mg by mouth as needed.)   oxybutynin (DITROPAN) 5 MG tablet Take 5 mg by mouth 2 (two) times daily.   spironolactone  (ALDACTONE ) 25 MG tablet TAKE 1 TABLET EVERY DAY   torsemide  (DEMADEX ) 20 MG tablet Take 1 tablet (20 mg total) by mouth 2 (two) times daily. (Patient taking differently: Take 10-20 mg by mouth daily.)   trimethoprim  (TRIMPEX ) 100 MG  tablet Take 100 mg by mouth daily.     No Known Allergies  Social History   Socioeconomic History   Marital status: Single    Spouse name: Not on file   Number of children: Not on file   Years of education: Not on file   Highest education level: Not on file  Occupational History   Not on file  Tobacco Use   Smoking status: Never    Passive exposure: Never   Smokeless tobacco: Never  Vaping Use   Vaping status: Never Used  Substance and Sexual Activity   Alcohol use: Not Currently    Comment: seldom   Drug use: Never   Sexual activity: Not Currently    Partners: Male    Birth control/protection: Post-menopausal    Comment: 1st intercourse 68 yo- more than 5 lifetime  partners-Mirena  2023  Other Topics Concern   Not on file  Social History Narrative   Not on file   Social Drivers of Health   Financial Resource Strain: Not on file  Food Insecurity: Not on file  Transportation Needs: Not on file  Physical Activity: Not on file  Stress: Not on file  Social Connections: Not on file  Intimate Partner Violence: Not on file     Review of Systems: General: negative for chills, fever, night sweats or weight changes.  Cardiovascular: negative for chest pain, dyspnea on exertion, edema, orthopnea, palpitations, paroxysmal nocturnal dyspnea or shortness of breath Dermatological: negative for rash Respiratory: negative for cough or wheezing Urologic: negative for hematuria Abdominal: negative for nausea, vomiting, diarrhea, bright red blood per rectum, melena, or hematemesis Neurologic: negative for visual changes, syncope, or dizziness All other systems reviewed and are otherwise negative except as noted above.    Blood pressure 111/75, pulse 78, resp. rate 16, height 5' 10 (1.778 m), weight 282 lb 9.6 oz (128.2 kg), SpO2 98%.  General appearance: alert and no distress Neck: no adenopathy, no carotid bruit, no JVD, supple, symmetrical, trachea midline, and thyroid  not enlarged, symmetric, no tenderness/mass/nodules Lungs: clear to auscultation bilaterally Heart: regular rate and rhythm, S1, S2 normal, no murmur, click, rub or gallop Extremities: extremities normal, atraumatic, no cyanosis or edema Pulses: 2+ and symmetric Skin: Skin color, texture, turgor normal. No rashes or lesions Neurologic: Grossly normal  EKG EKG Interpretation Date/Time:  Wednesday August 24 2024 14:53:43 EDT Ventricular Rate:  90 PR Interval:  164 QRS Duration:  132 QT Interval:  400 QTC Calculation: 489 R Axis:   -18  Text Interpretation: Normal sinus rhythm with sinus arrhythmia Right bundle branch block When compared with ECG of 20-Oct-2023 15:19, No significant  change was found Confirmed by Court Carrier [phone]) on 08/24/2024 3:33:52 PM    ASSESSMENT AND PLAN:   Morbid obesity (HCC) History of morbid obesity with a weight of 282 today, minimally changed from 3 years ago.  Hypertension History of essential hypertension blood pressure measured today 111/75.  She is on carvedilol .  Diastolic dysfunction History of diastolic dysfunction on torsemide  with 2D echo performed 4//25 revealing normal LV systolic function, grade 1 diastolic dysfunction and mild LVH with normal valvular function.     Carrier DOROTHA Court MD FACP,FACC,FAHA, [facility] 08/24/2024 3:43 PM

## 2024-08-24 NOTE — Assessment & Plan Note (Signed)
 History of morbid obesity with a weight of 282 today, minimally changed from 3 years ago.

## 2024-08-24 NOTE — Patient Instructions (Signed)
 Medication Instructions:  Your physician recommends that you continue on your current medications as directed. Please refer to the Current Medication list given to you today.  *If you need a refill on your cardiac medications before your next appointment, please call your pharmacy*   Follow-Up: At [facility], you and your health needs are our priority.  As part of our continuing mission to provide you with exceptional heart care, our providers are all part of one team.  This team includes your primary Cardiologist (physician) and Advanced Practice Providers or APPs (Physician Assistants and Nurse Practitioners) who all work together to provide you with the care you need, when you need it.  Your next appointment:   12 month(s)  Provider:   Orren Fabry, PA-C, Dayna Dunn, PA-C, Callie Goodrich, PA-C, Hao Meng, PA-C, or [place], PA-C         Then, Dorn Lesches, MD will plan to see you again in 2 year(s).

## 2024-08-24 NOTE — Assessment & Plan Note (Signed)
 History of essential hypertension blood pressure measured today 111/75.  She is on carvedilol .

## 2024-08-24 NOTE — Assessment & Plan Note (Signed)
 History of diastolic dysfunction on torsemide  with 2D echo performed 4//25 revealing normal LV systolic function, grade 1 diastolic dysfunction and mild LVH with normal valvular function.

## 2024-08-25 ENCOUNTER — Ambulatory Visit
Admission: RE | Admit: 2024-08-25 | Discharge: 2024-08-25 | Disposition: A | Discharge status: Home / Self Care | Source: Ambulatory Visit | Attending: Internal Medicine | Admitting: Internal Medicine

## 2024-08-25 ENCOUNTER — Ambulatory Visit

## 2024-08-25 DIAGNOSIS — Z1231 Encounter for screening mammogram for malignant neoplasm of breast: Secondary | ICD-10-CM

## 2024-08-26 ENCOUNTER — Encounter: Payer: Self-pay | Admitting: Radiology

## 2024-08-26 NOTE — Progress Notes (Deleted)
   Kristen Shaffer 15-Oct-1956 [id number]   History: Postmenopausal 68 y.o. presents for annual exam. Hx of endometrial hyperplasia, Mirena  inserted 2023, no further bleeding. Doing well gyn wise. No concerns. Has a PCP who manages her other health concerns.   Gynecologic History Postmenopausal Last Pap: 2021. Results were: normal Last mammogram: 08/25/24. Results pending Last colonoscopy: 2021 DEXA:2021 normal repeat 2026   Obstetric History OB History  Gravida Para Term Preterm AB Living  2    2 0  SAB IAB Ectopic Multiple Live Births          # Outcome Date GA Lbr Len/2nd Weight Sex Type Anes PTL Lv  2 AB           1 AB              The following portions of the patient's history were reviewed and updated as appropriate: allergies, current medications, past family history, past medical history, past social history, past surgical history, and problem list.  Review of Systems Pertinent items noted in HPI and remainder of comprehensive ROS otherwise negative.  Past medical history, past surgical history, family history and social history were all reviewed and documented in the EPIC chart.  Exam:  There were no vitals filed for this visit.  There is no height or weight on file to calculate BMI.  General appearance:  NAD, morbidly obese Thyroid :  Symmetrical, normal in size, without palpable masses or nodularity. Respiratory  Auscultation:  Clear without wheezing or rhonchi Cardiovascular  Auscultation:  Regular rate, without rubs, murmurs or gallops  Edema/varicosities:  Not grossly evident Abdominal  Soft,nontender, without masses, guarding or rebound.  Liver/spleen:  No organomegaly noted  Hernia:  None appreciated  Skin  Inspection:  Grossly normal Breasts: Examined lying and sitting.   Right: Without masses, retractions, nipple discharge or axillary adenopathy.   Left: Without masses, retractions, nipple discharge or axillary adenopathy. Genitourinary    Inguinal/mons:  Normal without inguinal adenopathy  External genitalia:  +vitiligo on labia, buttocks and groin. Otherwise normal appearing vulva with no masses, tenderness, or lesions  BUS/Urethra/Skene's glands:  Normal  Vagina:  Normal appearing with normal color and discharge, no lesions. Atrophy: mild   Cervix:  Normal appearing without discharge or lesions. Strings seen about 3-4cm from os  Uterus:  Normal in size, shape and contour.  Midline and mobile, nontender  Adnexa/parametria:     Rt: Normal in size, without masses or tenderness.   Lt: Normal in size, without masses or tenderness.      Kristen Shaffer, CMA present for exam  Assessment/Plan:   1. Well woman exam with routine gynecological exam - Cytology - PAP( [place]) -IUD in place for endometrial protection may remain until 2031    Discussed SBE, colonoscopy and DEXA screening as directed. Recommend of exercise weekly, including weight bearing exercise. Encouraged the use of seatbelts and sunscreen.  Return in 1 year for annual (high risk medicare) or sooner prn.  Kristen Shaffer WHNP-BC, 9:16 AM 08/26/2024

## 2024-09-02 ENCOUNTER — Encounter: Payer: Self-pay | Admitting: Radiology

## 2024-09-02 ENCOUNTER — Ambulatory Visit (INDEPENDENT_AMBULATORY_CARE_PROVIDER_SITE_OTHER): Payer: PRIVATE HEALTH INSURANCE | Admitting: Radiology

## 2024-09-02 VITALS — BP 124/76 | Ht 67.25 in | Wt 282.0 lb

## 2024-09-02 DIAGNOSIS — Z01419 Encounter for gynecological examination (general) (routine) without abnormal findings: Secondary | ICD-10-CM

## 2024-09-02 NOTE — Progress Notes (Signed)
   Kristen Shaffer 1956-07-12 [id number]   History: Postmenopausal 68 y.o. presents for annual exam. Hx of endometrial hyperplasia, Mirena  inserted 2023. Doing well gyn wise. No concerns. Has a PCP who manages her other health concerns.   Gynecologic History Postmenopausal Last Pap: 10/24. Results were: normal Last mammogram: 08/25/2024. Results were: normal Last colonoscopy: 2021 DEXA:2021 normal repeat 2026   Obstetric History OB History  Gravida Para Term Preterm AB Living  2    2 0  SAB IAB Ectopic Multiple Live Births          # Outcome Date GA Lbr Len/2nd Weight Sex Type Anes PTL Lv  2 AB           1 AB              The following portions of the patient's history were reviewed and updated as appropriate: allergies, current medications, past family history, past medical history, past social history, past surgical history, and problem list.  Review of Systems Pertinent items noted in HPI and remainder of comprehensive ROS otherwise negative.  Past medical history, past surgical history, family history and social history were all reviewed and documented in the EPIC chart.  Exam:  Vitals:   09/02/24 1010  BP: 124/76  Weight: 282 lb (127.9 kg)  Height: 5' 7.25 (1.708 m)    Body mass index is 43.84 kg/m.  General appearance:  NAD, morbidly obese Thyroid :  Symmetrical, normal in size, without palpable masses or nodularity. Respiratory  Auscultation:  Clear without wheezing or rhonchi Cardiovascular  Auscultation:  Regular rate, without rubs, murmurs or gallops  Edema/varicosities:  Not grossly evident Abdominal  Soft,nontender, without masses, guarding or rebound.  Liver/spleen:  No organomegaly noted  Hernia:  None appreciated  Skin  Inspection:  Grossly normal Breasts: Examined lying and sitting.   Right: Without masses, retractions, nipple discharge or axillary adenopathy.   Left: Without masses, retractions, nipple discharge or axillary  adenopathy. Genitourinary   Inguinal/mons:  Normal without inguinal adenopathy  External genitalia:  +vitiligo on labia, buttocks and groin. Otherwise normal appearing vulva with no masses, tenderness, or lesions  BUS/Urethra/Skene's glands:  Normal  Vagina:  Normal appearing with normal color and discharge, no lesions. Atrophy: mild   Cervix:  Normal appearing without discharge or lesions. Strings seen about 3-4cm from os  Uterus:  Normal in size, shape and contour.  Midline and mobile, nontender  Adnexa/parametria:     Rt: Normal in size, without masses or tenderness.   Lt: Normal in size, without masses or tenderness.     Darice Hoit, CMA present for exam  Assessment/Plan:   1. Well woman exam with routine gynecological exam - Cytology - PAP( [place]) -IUD in place for endometrial protection may remain until 2031     Return in 1 year for annual (high risk medicare) or sooner prn.  Jj Enyeart B WHNP-BC, 10:36 AM 09/02/2024
